# Patient Record
Sex: Male | Born: 1960
Health system: Southern US, Community
[De-identification: ages and names within clinical notes are randomized; demographics above are authoritative.]

## PROBLEM LIST (undated history)

## (undated) DIAGNOSIS — S5420XA Injury of radial nerve at forearm level, unspecified arm, initial encounter: Secondary | ICD-10-CM

## (undated) DIAGNOSIS — F528 Other sexual dysfunction not due to a substance or known physiological condition: Secondary | ICD-10-CM

## (undated) DIAGNOSIS — M722 Plantar fascial fibromatosis: Secondary | ICD-10-CM

## (undated) DIAGNOSIS — Z86010 Personal history of colonic polyps: Secondary | ICD-10-CM

## (undated) DIAGNOSIS — I10 Essential (primary) hypertension: Secondary | ICD-10-CM

## (undated) DIAGNOSIS — E785 Hyperlipidemia, unspecified: Secondary | ICD-10-CM

## (undated) DIAGNOSIS — E291 Testicular hypofunction: Secondary | ICD-10-CM

## (undated) DIAGNOSIS — K219 Gastro-esophageal reflux disease without esophagitis: Secondary | ICD-10-CM

## (undated) HISTORY — DX: Personal history of colonic polyps: Z86.010

## (undated) HISTORY — PX: TOTAL SHOULDER REPLACEMENT: SUR1217

## (undated) HISTORY — DX: Hyperlipidemia, unspecified: E78.5

## (undated) HISTORY — DX: Gastro-esophageal reflux disease without esophagitis: K21.9

## (undated) HISTORY — PX: COLONOSCOPY: SHX174

## (undated) HISTORY — DX: Other sexual dysfunction not due to a substance or known physiological condition: F52.8

## (undated) HISTORY — DX: Testicular hypofunction: E29.1

## (undated) HISTORY — DX: Injury of radial nerve at forearm level, unspecified arm, initial encounter: S54.20XA

## (undated) HISTORY — DX: Essential (primary) hypertension: I10

## (undated) HISTORY — DX: Plantar fascial fibromatosis: M72.2

---

## 2003-02-13 ENCOUNTER — Ambulatory Visit (HOSPITAL_COMMUNITY): Admission: RE | Admit: 2003-02-13 | Discharge: 2003-02-13 | Payer: Self-pay | Admitting: Gastroenterology

## 2003-02-13 ENCOUNTER — Encounter (INDEPENDENT_AMBULATORY_CARE_PROVIDER_SITE_OTHER): Payer: Self-pay | Admitting: *Deleted

## 2004-01-24 ENCOUNTER — Ambulatory Visit: Payer: Self-pay | Admitting: Internal Medicine

## 2004-04-11 ENCOUNTER — Ambulatory Visit: Payer: Self-pay | Admitting: Internal Medicine

## 2004-07-23 ENCOUNTER — Ambulatory Visit: Payer: Self-pay | Admitting: Internal Medicine

## 2004-08-24 ENCOUNTER — Ambulatory Visit: Payer: Self-pay | Admitting: Family Medicine

## 2004-10-22 ENCOUNTER — Ambulatory Visit: Payer: Self-pay | Admitting: Internal Medicine

## 2005-01-20 ENCOUNTER — Ambulatory Visit: Payer: Self-pay | Admitting: Internal Medicine

## 2005-03-24 ENCOUNTER — Ambulatory Visit: Payer: Self-pay | Admitting: Internal Medicine

## 2005-08-20 ENCOUNTER — Ambulatory Visit: Payer: Self-pay | Admitting: Internal Medicine

## 2005-09-22 ENCOUNTER — Ambulatory Visit: Payer: Self-pay | Admitting: Internal Medicine

## 2005-10-06 ENCOUNTER — Ambulatory Visit: Payer: Self-pay | Admitting: Internal Medicine

## 2005-12-29 ENCOUNTER — Ambulatory Visit: Payer: Self-pay | Admitting: Internal Medicine

## 2005-12-29 LAB — CONVERTED CEMR LAB
Chol/HDL Ratio, serum: 5.7
Cholesterol: 226 mg/dL
HDL: 39.6 mg/dL
LDL DIRECT: 162.6 mg/dL
Triglyceride fasting, serum: 128 mg/dL
VLDL: 26 mg/dL

## 2006-01-12 ENCOUNTER — Ambulatory Visit: Payer: Self-pay | Admitting: Internal Medicine

## 2006-04-06 ENCOUNTER — Ambulatory Visit: Payer: Self-pay | Admitting: Internal Medicine

## 2006-04-06 LAB — CONVERTED CEMR LAB
Alkaline Phosphatase: 67 units/L (ref 39–117)
HDL: 44.6 mg/dL (ref >39.0)
Total Bilirubin: 1 mg/dL (ref 0.3–1.2)
Total Protein: 6.8 g/dL (ref 6.0–8.3)
Triglycerides: 119 mg/dL (ref 0–149)

## 2006-04-13 ENCOUNTER — Ambulatory Visit: Payer: Self-pay | Admitting: Internal Medicine

## 2006-08-18 ENCOUNTER — Ambulatory Visit: Payer: Self-pay | Admitting: Internal Medicine

## 2006-08-19 LAB — CONVERTED CEMR LAB
Alkaline Phosphatase: 82 units/L (ref 39–117)
BUN: 15 mg/dL (ref 6–23)
Bilirubin, Direct: 0.1 mg/dL (ref 0.0–0.3)
CO2: 32 meq/L (ref 19–32)
GFR calc Af Amer: 94 mL/min
Glucose, Bld: 99 mg/dL (ref 70–99)
Potassium: 3.7 meq/L (ref 3.5–5.1)
Total Protein: 7.4 g/dL (ref 6.0–8.3)

## 2006-12-09 DIAGNOSIS — I152 Hypertension secondary to endocrine disorders: Secondary | ICD-10-CM | POA: Insufficient documentation

## 2006-12-09 DIAGNOSIS — E785 Hyperlipidemia, unspecified: Secondary | ICD-10-CM

## 2006-12-09 DIAGNOSIS — E1169 Type 2 diabetes mellitus with other specified complication: Secondary | ICD-10-CM | POA: Insufficient documentation

## 2006-12-09 DIAGNOSIS — K219 Gastro-esophageal reflux disease without esophagitis: Secondary | ICD-10-CM

## 2006-12-09 DIAGNOSIS — I10 Essential (primary) hypertension: Secondary | ICD-10-CM

## 2006-12-09 DIAGNOSIS — Z860100 Personal history of colon polyps, unspecified: Secondary | ICD-10-CM

## 2006-12-09 DIAGNOSIS — J309 Allergic rhinitis, unspecified: Secondary | ICD-10-CM | POA: Insufficient documentation

## 2006-12-09 DIAGNOSIS — E1159 Type 2 diabetes mellitus with other circulatory complications: Secondary | ICD-10-CM | POA: Insufficient documentation

## 2006-12-09 HISTORY — DX: Gastro-esophageal reflux disease without esophagitis: K21.9

## 2006-12-09 HISTORY — DX: Personal history of colonic polyps: Z86.010

## 2006-12-09 HISTORY — DX: Hyperlipidemia, unspecified: E78.5

## 2006-12-09 HISTORY — DX: Personal history of colon polyps, unspecified: Z86.0100

## 2006-12-09 HISTORY — DX: Essential (primary) hypertension: I10

## 2006-12-14 ENCOUNTER — Ambulatory Visit: Payer: Self-pay | Admitting: Internal Medicine

## 2007-03-08 ENCOUNTER — Telehealth (INDEPENDENT_AMBULATORY_CARE_PROVIDER_SITE_OTHER): Payer: Self-pay | Admitting: *Deleted

## 2007-04-12 ENCOUNTER — Ambulatory Visit: Payer: Self-pay | Admitting: Internal Medicine

## 2007-04-12 LAB — CONVERTED CEMR LAB
Cholesterol: 238 mg/dL (ref 0–200)
HDL: 43.9 mg/dL (ref >39.0)
LDL Cholesterol: 178.1 mg/dL
Triglycerides: 92 mg/dL (ref 0–149)

## 2007-04-19 ENCOUNTER — Ambulatory Visit: Payer: Self-pay | Admitting: Internal Medicine

## 2007-04-19 DIAGNOSIS — F341 Dysthymic disorder: Secondary | ICD-10-CM | POA: Insufficient documentation

## 2007-05-21 ENCOUNTER — Telehealth: Payer: Self-pay | Admitting: Internal Medicine

## 2007-05-31 ENCOUNTER — Telehealth: Payer: Self-pay | Admitting: Internal Medicine

## 2007-07-19 ENCOUNTER — Ambulatory Visit: Payer: Self-pay | Admitting: Internal Medicine

## 2007-07-19 DIAGNOSIS — T887XXA Unspecified adverse effect of drug or medicament, initial encounter: Secondary | ICD-10-CM | POA: Insufficient documentation

## 2007-07-19 LAB — CONVERTED CEMR LAB
Albumin: 3.8 g/dL (ref 3.5–5.2)
Cholesterol: 244 mg/dL (ref 0–200)
HDL: 37.2 mg/dL — ABNORMAL LOW (ref >39.0)
Total Protein: 6.8 g/dL (ref 6.0–8.3)
Triglycerides: 212 mg/dL (ref 0–149)
VLDL: 42 mg/dL — ABNORMAL HIGH (ref 0–40)

## 2007-08-02 ENCOUNTER — Ambulatory Visit: Payer: Self-pay | Admitting: Internal Medicine

## 2007-08-02 DIAGNOSIS — F528 Other sexual dysfunction not due to a substance or known physiological condition: Secondary | ICD-10-CM | POA: Insufficient documentation

## 2007-08-02 HISTORY — DX: Other sexual dysfunction not due to a substance or known physiological condition: F52.8

## 2007-08-02 LAB — CONVERTED CEMR LAB: LDL Goal: 130 mg/dL

## 2007-08-05 LAB — CONVERTED CEMR LAB: Testosterone: 241.97 ng/dL — ABNORMAL LOW (ref 350.00–890)

## 2007-11-01 ENCOUNTER — Ambulatory Visit: Payer: Self-pay | Admitting: Internal Medicine

## 2007-11-15 LAB — CONVERTED CEMR LAB
BUN: 15 mg/dL (ref 6–23)
Chloride: 104 meq/L (ref 96–112)
GFR calc non Af Amer: 77 mL/min
Glucose, Bld: 108 mg/dL — ABNORMAL HIGH (ref 70–99)
Potassium: 3.8 meq/L (ref 3.5–5.1)
Sex Hormone Binding: 14 nmol/L (ref 13–71)
Sodium: 143 meq/L (ref 135–145)
Testosterone Free: 69.7 pg/mL (ref 47.0–244.0)
Testosterone: 241.95 ng/dL — ABNORMAL LOW (ref 350–890)

## 2007-11-17 ENCOUNTER — Ambulatory Visit: Payer: Self-pay | Admitting: Internal Medicine

## 2007-12-22 ENCOUNTER — Telehealth: Payer: Self-pay | Admitting: *Deleted

## 2008-01-31 ENCOUNTER — Ambulatory Visit: Payer: Self-pay | Admitting: Internal Medicine

## 2008-03-28 ENCOUNTER — Encounter: Payer: Self-pay | Admitting: Internal Medicine

## 2008-05-10 ENCOUNTER — Ambulatory Visit: Payer: Self-pay | Admitting: Internal Medicine

## 2008-05-10 LAB — CONVERTED CEMR LAB
ALT: 35 units/L (ref 0–53)
Albumin: 3.9 g/dL (ref 3.5–5.2)
BUN: 18 mg/dL (ref 6–23)
Basophils Relative: 0.2 % (ref 0.0–3.0)
Bilirubin Urine: NEGATIVE
Bilirubin, Direct: 0.1 mg/dL (ref 0.0–0.3)
CO2: 32 meq/L (ref 19–32)
Calcium: 9.4 mg/dL (ref 8.4–10.5)
Cholesterol: 221 mg/dL (ref 0–200)
Creatinine, Ser: 1.2 mg/dL (ref 0.4–1.5)
Eosinophils Relative: 3.2 % (ref 0.0–5.0)
GFR calc Af Amer: 84 mL/min
Glucose, Bld: 104 mg/dL — ABNORMAL HIGH (ref 70–99)
Glucose, Urine, Semiquant: NEGATIVE
HCT: 40.9 % (ref 39.0–52.0)
Hemoglobin: 14.1 g/dL (ref 13.0–17.0)
Lymphocytes Relative: 34.5 % (ref 12.0–46.0)
Monocytes Absolute: 0.7 10*3/uL (ref 0.1–1.0)
Monocytes Relative: 10.4 % (ref 3.0–12.0)
Neutro Abs: 3.5 10*3/uL (ref 1.4–7.7)
PSA: 0.44 ng/mL (ref 0.10–4.00)
RBC: 4.92 M/uL (ref 4.22–5.81)
RDW: 12.2 % (ref 11.5–14.6)
TSH: 1.62 microintl units/mL (ref 0.35–5.50)
Total CHOL/HDL Ratio: 4.9
Total Protein: 7.1 g/dL (ref 6.0–8.3)
Urobilinogen, UA: 0.2
VLDL: 14 mg/dL (ref 0–40)
WBC Urine, dipstick: NEGATIVE
WBC: 6.7 10*3/uL (ref 4.5–10.5)

## 2008-05-29 ENCOUNTER — Ambulatory Visit: Payer: Self-pay | Admitting: Internal Medicine

## 2008-05-31 ENCOUNTER — Telehealth: Payer: Self-pay | Admitting: Internal Medicine

## 2008-08-23 ENCOUNTER — Ambulatory Visit: Payer: Self-pay | Admitting: Internal Medicine

## 2008-08-23 LAB — CONVERTED CEMR LAB
ALT: 38 units/L (ref 0–53)
Alkaline Phosphatase: 70 units/L (ref 39–117)
Bilirubin, Direct: 0 mg/dL (ref 0.0–0.3)
HDL: 46.3 mg/dL (ref >39.00)
Total Bilirubin: 0.9 mg/dL (ref 0.3–1.2)
Total Protein: 6.8 g/dL (ref 6.0–8.3)

## 2008-09-25 ENCOUNTER — Ambulatory Visit: Payer: Self-pay | Admitting: Internal Medicine

## 2008-10-18 ENCOUNTER — Telehealth (INDEPENDENT_AMBULATORY_CARE_PROVIDER_SITE_OTHER): Payer: Self-pay | Admitting: *Deleted

## 2008-12-18 ENCOUNTER — Ambulatory Visit: Payer: Self-pay | Admitting: Internal Medicine

## 2008-12-18 LAB — CONVERTED CEMR LAB
Albumin: 3.7 g/dL (ref 3.5–5.2)
Alkaline Phosphatase: 78 units/L (ref 39–117)
Cholesterol: 220 mg/dL — ABNORMAL HIGH (ref 0–200)
Direct LDL: 156.5 mg/dL
Total Bilirubin: 0.5 mg/dL (ref 0.3–1.2)
Total CHOL/HDL Ratio: 6
Triglycerides: 251 mg/dL — ABNORMAL HIGH (ref 0.0–149.0)
VLDL: 50.2 mg/dL — ABNORMAL HIGH (ref 0.0–40.0)

## 2008-12-25 ENCOUNTER — Ambulatory Visit: Payer: Self-pay | Admitting: Internal Medicine

## 2009-02-19 ENCOUNTER — Telehealth: Payer: Self-pay | Admitting: Internal Medicine

## 2009-04-30 ENCOUNTER — Ambulatory Visit: Payer: Self-pay | Admitting: Internal Medicine

## 2009-05-01 ENCOUNTER — Encounter (INDEPENDENT_AMBULATORY_CARE_PROVIDER_SITE_OTHER): Payer: Self-pay | Admitting: *Deleted

## 2009-05-25 ENCOUNTER — Encounter (INDEPENDENT_AMBULATORY_CARE_PROVIDER_SITE_OTHER): Payer: Self-pay | Admitting: *Deleted

## 2009-05-28 ENCOUNTER — Ambulatory Visit: Payer: Self-pay | Admitting: Gastroenterology

## 2009-06-01 ENCOUNTER — Telehealth: Payer: Self-pay | Admitting: Internal Medicine

## 2009-06-06 ENCOUNTER — Telehealth: Payer: Self-pay | Admitting: Internal Medicine

## 2009-07-23 ENCOUNTER — Encounter: Payer: Self-pay | Admitting: Internal Medicine

## 2009-08-20 ENCOUNTER — Ambulatory Visit: Payer: Self-pay | Admitting: Internal Medicine

## 2009-08-20 LAB — CONVERTED CEMR LAB
Albumin: 4 g/dL (ref 3.5–5.2)
Alkaline Phosphatase: 89 units/L (ref 39–117)
BUN: 13 mg/dL (ref 6–23)
Basophils Absolute: 0 10*3/uL (ref 0.0–0.1)
Bilirubin Urine: NEGATIVE
Blood in Urine, dipstick: NEGATIVE
CO2: 31 meq/L (ref 19–32)
Calcium: 8.9 mg/dL (ref 8.4–10.5)
Cholesterol: 223 mg/dL — ABNORMAL HIGH (ref 0–200)
Creatinine, Ser: 0.8 mg/dL (ref 0.4–1.5)
Eosinophils Absolute: 0.2 10*3/uL (ref 0.0–0.7)
GFR calc non Af Amer: 103.98 mL/min (ref >60)
Glucose, Bld: 99 mg/dL (ref 70–99)
HDL: 47.9 mg/dL (ref >39.00)
Hemoglobin: 14.6 g/dL (ref 13.0–17.0)
Ketones, urine, test strip: NEGATIVE
Lymphocytes Relative: 32.7 % (ref 12.0–46.0)
MCHC: 34.5 g/dL (ref 30.0–36.0)
Neutro Abs: 4.1 10*3/uL (ref 1.4–7.7)
Neutrophils Relative %: 55.9 % (ref 43.0–77.0)
Platelets: 210 10*3/uL (ref 150.0–400.0)
RDW: 13.2 % (ref 11.5–14.6)
Specific Gravity, Urine: 1.025
Total Bilirubin: 0.4 mg/dL (ref 0.3–1.2)
Triglycerides: 169 mg/dL — ABNORMAL HIGH (ref 0.0–149.0)

## 2009-08-27 ENCOUNTER — Ambulatory Visit: Payer: Self-pay | Admitting: Internal Medicine

## 2009-08-27 DIAGNOSIS — K621 Rectal polyp: Secondary | ICD-10-CM

## 2009-08-27 DIAGNOSIS — K620 Anal polyp: Secondary | ICD-10-CM | POA: Insufficient documentation

## 2009-09-05 ENCOUNTER — Encounter: Payer: Self-pay | Admitting: Internal Medicine

## 2009-11-02 ENCOUNTER — Telehealth: Payer: Self-pay | Admitting: Internal Medicine

## 2010-01-08 DIAGNOSIS — E291 Testicular hypofunction: Secondary | ICD-10-CM

## 2010-01-08 HISTORY — DX: Testicular hypofunction: E29.1

## 2010-01-10 ENCOUNTER — Encounter: Payer: Self-pay | Admitting: Internal Medicine

## 2010-01-21 ENCOUNTER — Ambulatory Visit: Payer: Self-pay | Admitting: Internal Medicine

## 2010-01-21 DIAGNOSIS — M722 Plantar fascial fibromatosis: Secondary | ICD-10-CM

## 2010-01-21 DIAGNOSIS — S5420XA Injury of radial nerve at forearm level, unspecified arm, initial encounter: Secondary | ICD-10-CM | POA: Insufficient documentation

## 2010-01-21 HISTORY — DX: Plantar fascial fibromatosis: M72.2

## 2010-01-21 HISTORY — DX: Injury of radial nerve at forearm level, unspecified arm, initial encounter: S54.20XA

## 2010-03-12 ENCOUNTER — Ambulatory Visit
Admission: RE | Admit: 2010-03-12 | Discharge: 2010-03-12 | Payer: Self-pay | Source: Home / Self Care | Attending: Internal Medicine | Admitting: Internal Medicine

## 2010-04-09 ENCOUNTER — Encounter: Payer: Self-pay | Admitting: *Deleted

## 2010-04-10 ENCOUNTER — Other Ambulatory Visit: Payer: Self-pay | Admitting: *Deleted

## 2010-04-11 ENCOUNTER — Telehealth: Payer: Self-pay | Admitting: Internal Medicine

## 2010-04-16 NOTE — Letter (Signed)
Summary: [facility] Instructions  [facility]  [address] [place], [place] [address]   Phone: [phone]  Fax: [phone]       SUVAN STCYR    [birth year]    MRN: [id number]        Procedure Day /Date: Monday 06/11/09     Arrival Time: 9:30 am     Procedure Time: 10:30 am     Location of Procedure:                    _x_  [facility] (4th Floor)                        PREPARATION FOR COLONOSCOPY WITH MOVIPREP   Starting 5 days prior to your procedure 06/06/09 do not eat nuts, seeds, popcorn, corn, beans, peas,  salads, or any raw vegetables.  Do not take any fiber supplements (e.g. Metamucil, Citrucel, and Benefiber).  THE DAY BEFORE YOUR PROCEDURE         DATE: 06/10/09  DAY: Sunday  1.  Drink clear liquids the entire day-NO SOLID FOOD  2.  Do not drink anything colored red or purple.  Avoid juices with pulp.  No orange juice.  3.  Drink at least 64 oz. (8 glasses) of fluid/clear liquids during the day to prevent dehydration and help the prep work efficiently.  CLEAR LIQUIDS INCLUDE: Water Jello Ice Popsicles Tea (sugar ok, no milk/cream) Powdered fruit flavored drinks Coffee (sugar ok, no milk/cream) Gatorade Juice: apple, white grape, white cranberry  Lemonade Clear bullion, consomm, broth Carbonated beverages (any kind) Strained chicken noodle soup Hard Candy                             4.  In the morning, mix first dose of MoviPrep solution:    Empty 1 Pouch A and 1 Pouch B into the disposable container    Add lukewarm drinking water to the top line of the container. Mix to dissolve    Refrigerate (mixed solution should be used within 24 hrs)  5.  Begin drinking the prep at 5:00 p.m. The MoviPrep container is divided by 4 marks.   Every 15 minutes drink the solution down to the next mark (approximately 8 oz) until the full liter is complete.   6.  Follow completed prep with 16 oz of clear liquid of your choice (Nothing  red or purple).  Continue to drink clear liquids until bedtime.  7.  Before going to bed, mix second dose of MoviPrep solution:    Empty 1 Pouch A and 1 Pouch B into the disposable container    Add lukewarm drinking water to the top line of the container. Mix to dissolve    Refrigerate  THE DAY OF YOUR PROCEDURE      DATE: 06/11/09  DAY: Monday  Beginning at 5:30 a.m. (5 hours before procedure):         1. Every 15 minutes, drink the solution down to the next mark (approx 8 oz) until the full liter is complete.  2. Follow completed prep with 16 oz. of clear liquid of your choice.    3. You may drink clear liquids until 8:30 a.m. (2 HOURS BEFORE PROCEDURE).   MEDICATION INSTRUCTIONS  Unless otherwise instructed, you should take regular prescription medications with a small sip of water   as early as possible the morning of  your procedure.  Hold your Moexipril/HCTZ the morning of procedure.       OTHER INSTRUCTIONS  You will need a responsible adult at least 48 years of age to accompany you and drive you home.   This person must remain in the waiting room during your procedure.  Wear loose fitting clothing that is easily removed.  Leave jewelry and other valuables at home.  However, you may wish to bring a book to read or  an iPod/MP3 player to listen to music as you wait for your procedure to start.  Remove all body piercing jewelry and leave at home.  Total time from sign-in until discharge is approximately 2-3 hours.  You should go home directly after your procedure and rest.  You can resume normal activities the  day after your procedure.  The day of your procedure you should not:   Drive   Make legal decisions   Operate machinery   Drink alcohol   Return to work  You will receive specific instructions about eating, activities and medications before you leave.    The above instructions have been reviewed and explained to me by   Wyona Almas RN   May 28, 2009 11:27 AM     I fully understand and can verbalize these instructions _____________________________ Date _________

## 2010-04-16 NOTE — Progress Notes (Signed)
Summary: Patient wants a call  Phone Note Call from Patient Call back at Work Phone [phone]   Caller: Patient Reason for Call: Talk to Nurse, Insurance Question Summary of Call: Patient wants to speak with you.  No other message left. Initial call taken by: Everrett Coombe,  June 06, 2009 11:38 AM  Follow-up for Phone Call        colonoscopy scheduled will cost- wants cheaper colonsocopy - sent referral to [facility] Follow-up by: Willy Eddy, LPN,  June 06, 2009 11:52 AM

## 2010-04-16 NOTE — Miscellaneous (Signed)
Summary: [facility] Previsit/prep  Clinical Lists Changes  Medications: Added new medication of MOVIPREP 100 GM  SOLR (PEG-KCL-NACL-NASULF-NA ASC-C) As per prep instructions. - Signed Rx of MOVIPREP 100 GM  SOLR (PEG-KCL-NACL-NASULF-NA ASC-C) As per prep instructions.;  #1 x 0;  Signed;  Entered by: Wyona Almas RN;  Authorized by: Mardella Layman MD [facility];  Method used: Electronically to [facility] # [phone]*, [address] [place], [place], [place]  [address], Ph: [phone], Fax: [phone] Observations: Added new observation of NKA: T (05/28/2009 11:04)    Prescriptions: MOVIPREP 100 GM  SOLR (PEG-KCL-NACL-NASULF-NA ASC-C) As per prep instructions.  #1 x 0   Entered by:   Wyona Almas RN   Authorized by:   Mardella Layman MD [facility]   Signed by:   Wyona Almas RN on 05/28/2009   Method used:   [facility]ron[facility] to        [facility] # [phone]* (retail)       [address] [place], [place]  [address]       Ph: [phone]       Fax: [phone]   RxID:   [id number]

## 2010-04-16 NOTE — Assessment & Plan Note (Signed)
Summary: ROA/FUP/RCD   Vital Signs:  Patient profile:   50 year old male Height:      74 inches Weight:      242 pounds BMI:     31.18 Temp:     98.1 degrees F oral Pulse rate:   68 / minute Resp:     14 per minute BP sitting:   140 / 80  (left arm)  Vitals Entered By: Willy Eddy, LPN (April 30, 2009 10:12 AM)  Nutrition Counseling: Patient's BMI is greater than 25 and therefore counseled on weight management options. CC: roa, Hypertension Management   CC:  roa and Hypertension Management.  Hypertension History:      He denies headache, chest pain, palpitations, dyspnea with exertion, orthopnea, PND, peripheral edema, visual symptoms, neurologic problems, syncope, and side effects from treatment.        Positive major cardiovascular risk factors include male age 24 years old or older, hyperlipidemia, and hypertension.  Negative major cardiovascular risk factors include non-tobacco-user status.     Preventive Screening-Counseling & Management  Alcohol-Tobacco     Smoking Status: never  Problems Prior to Update: 1)  Headache, Sinus  (ICD-784.0) 2)  Acute Sinusitis, Unspecified  (ICD-461.9) 3)  Preventive Health Care  (ICD-V70.0) 4)  Erectile Dysfunction, Mild  (ICD-302.72) 5)  Uns Advrs Eff Uns Rx Medicinal&biological Sbstnc  (ICD-995.20) 6)  Anxiety Depression  (ICD-300.4) 7)  Family History Diabetes 1st Degree Relative  (ICD-V18.0) 8)  Family History Breast Cancer 1st Degree Relative <50  (ICD-V16.3) 9)  Colonic Polyps, Hx of  (ICD-V12.72) 10)  Gerd  (ICD-530.81) 11)  Allergic Rhinitis  (ICD-477.9) 12)  Hypertension  (ICD-401.9) 13)  Hyperlipidemia  (ICD-272.4)  Current Problems (verified): 1)  Headache, Sinus  (ICD-784.0) 2)  Acute Sinusitis, Unspecified  (ICD-461.9) 3)  Preventive Health Care  (ICD-V70.0) 4)  Erectile Dysfunction, Mild  (ICD-302.72) 5)  Uns Advrs Eff Uns Rx Medicinal&biological Sbstnc  (ICD-995.20) 6)  Anxiety Depression   (ICD-300.4) 7)  Family History Diabetes 1st Degree Relative  (ICD-V18.0) 8)  Family History Breast Cancer 1st Degree Relative <50  (ICD-V16.3) 9)  Colonic Polyps, Hx of  (ICD-V12.72) 10)  Gerd  (ICD-530.81) 11)  Allergic Rhinitis  (ICD-477.9) 12)  Hypertension  (ICD-401.9) 13)  Hyperlipidemia  (ICD-272.4)  Medications Prior to Update: 1)  Nexium 40 Mg  Cpdr (Esomeprazole Magnesium) .... Once Daily 2)  Adipex-P 37.5 Mg  Caps (Phentermine Hcl) .... Out!!!! 3)  Uniretic 15-25 Mg  Tabs (Moexipril-Hydrochlorothiazide) .... Once Daily 4)  Multivitamin/iron 60 Mg  Chew (Pediatric Multivit-Minerals-C) .... Once Daily 5)  Depo-Testosterone 200 Mg/ml Oil (Testosterone Cypionate) .Marland Kitchen.. 1 Ml  Monthly 6)  Bd Disp Needles 20g X 1" Misc (Needle (Disp)) .... With 3 Cc Syringes- # 10 Called In 7)  Fexofenadine-Pseudoephedrine 60-120 Mg Xr12h-Tab (Fexofenadine-Pseudoephedrine) .... One By Mouth Bid 8)  Elidel 1 % Crea (Pimecrolimus) .... Use As Directed 9)  Vytorin 10-40 Mg Tabs (Ezetimibe-Simvastatin) .... One By Mouth Two Times A Week 10)  Nasonex 50 Mcg/act Susp (Mometasone Furoate) .... Two Spray in Each Nostril  Daily 11)  Zithromax Z-Pak 250 Mg Tabs (Azithromycin) .... One By Mouth Daily 12)  Mucinex 600 Mg Xr12h-Tab (Guaifenesin) .... As Directed 13)  Zyrtec Allergy 10 Mg Caps (Cetirizine Hcl) .... One By Mouth Daily  Current Medications (verified): 1)  Nexium 40 Mg  Cpdr (Esomeprazole Magnesium) .... Once Daily 2)  Adipex-P 37.5 Mg  Caps (Phentermine Hcl) .... Out!!!! 3)  Uniretic 15-25 Mg  Tabs (Moexipril-Hydrochlorothiazide) .... Once Daily 4)  Multivitamin/iron 60 Mg  Chew (Pediatric Multivit-Minerals-C) .... Once Daily 5)  Depo-Testosterone 200 Mg/ml Oil (Testosterone Cypionate) .Marland Kitchen.. 1 Ml  Monthly 6)  Bd Disp Needles 20g X 1" Misc (Needle (Disp)) .... With 3 Cc Syringes- # 10 Called In 7)  Fexofenadine-Pseudoephedrine 60-120 Mg Xr12h-Tab (Fexofenadine-Pseudoephedrine) .... One By Mouth  Bid 8)  Elidel 1 % Crea (Pimecrolimus) .... Use As Directed 9)  Vytorin 10-40 Mg Tabs (Ezetimibe-Simvastatin) .... One By Mouth Two Times A Week 10)  Nasonex 50 Mcg/act Susp (Mometasone Furoate) .... Two Spray in Each Nostril  Daily  Allergies (verified): No Known Drug Allergies  Past History:  Family History: Last updated: 12/14/2006 Breast CA Family History Breast cancer 1st degree relative <50 Family History Diabetes 1st degree relative Family History Hypertension  Social History: Last updated: 12/09/2006 Occupation: Single Never Smoked  Risk Factors: Smoking Status: never (04/30/2009)  Past medical, surgical, family and social histories (including risk factors) reviewed, and no changes noted (except as noted below).  Past Medical History: Reviewed history from 12/09/2006 and no changes required. Hyperlipidemia Hypertension Allergic rhinitis GERD Colonic polyps, hx of  Past Surgical History: Reviewed history from 12/09/2006 and no changes required. Colonoscopy  Family History: Reviewed history from 12/14/2006 and no changes required. Breast CA Family History Breast cancer 1st degree relative <50 Family History Diabetes 1st degree relative Family History Hypertension  Social History: Reviewed history from 12/09/2006 and no changes required. Occupation: Single Never Smoked  Review of Systems  The patient denies anorexia, fever, weight loss, weight gain, vision loss, decreased hearing, hoarseness, chest pain, syncope, dyspnea on exertion, peripheral edema, prolonged cough, headaches, hemoptysis, abdominal pain, melena, hematochezia, severe indigestion/heartburn, hematuria, incontinence, genital sores, muscle weakness, suspicious skin lesions, transient blindness, difficulty walking, depression, unusual weight change, abnormal bleeding, enlarged lymph nodes, angioedema, and breast masses.    Physical Exam  General:  Well-developed,well-nourished,in no acute  distress; alert,appropriate and cooperative throughout examination Eyes:  pupils equal and pupils round.   Nose:  mucosal erythema, mucosal edema, and airflow obstruction.   Mouth:  posterior lymphoid hypertrophy and postnasal drip.   Neck:  No deformities, masses, or tenderness noted. Lungs:  Normal respiratory effort, chest expands symmetrically. Lungs are clear to auscultation, no crackles or wheezes. Heart:  Normal rate and regular rhythm. S1 and S2 normal without gallop, murmur, click, rub or other extra sounds. Abdomen:  Bowel sounds positive,abdomen soft and non-tender without masses, organomegaly or hernias noted.   Impression & Recommendations:  Problem # 1:  HYPERTENSION (ICD-401.9)  His updated medication list for this problem includes:    Uniretic 15-25 Mg Tabs (Moexipril-hydrochlorothiazide) ..... Once daily  BP today: 140/80 Prior BP: 140/72 (12/25/2008)  Prior 10 Yr Risk Heart Disease: Not enough information (12/25/2008)  Labs Reviewed: K+: 4.3 (05/10/2008) Creat: : 1.2 (05/10/2008)   Chol: 220 (12/18/2008)   HDL: 39.70 (12/18/2008)   LDL: DEL (05/10/2008)   TG: 251.0 (12/18/2008)  Problem # 2:  GERD (ICD-530.81) heart burn is satble but having increased episodes of break through pain took tums and it helped some, cannot relate to exercize  His updated medication list for this problem includes:    Nexium 40 Mg Cpdr (Esomeprazole magnesium) ..... Once daily  Labs Reviewed: Hgb: 14.1 (05/10/2008)   Hct: 40.9 (05/10/2008)  Problem # 3:  ANXIETY DEPRESSION (ICD-300.4) monitering mood and medication need  Problem # 4:  ERECTILE DYSFUNCTION, MILD (ICD-302.72) on testosterone Orders: Admin of patients own med IM/SQ (95621H)  Discussed proper use of medications, as well as side effects.   Complete Medication List: 1)  Nexium 40 Mg Cpdr (Esomeprazole magnesium) .... Once daily 2)  Adipex-p 37.5 Mg Caps (Phentermine hcl) .... Out!!!! 3)  Uniretic 15-25 Mg Tabs  (Moexipril-hydrochlorothiazide) .... Once daily 4)  Multivitamin/iron 60 Mg Chew (Pediatric multivit-minerals-c) .... Once daily 5)  Depo-testosterone 200 Mg/ml Oil (Testosterone cypionate) .Marland Kitchen.. 1 ml  monthly 6)  Bd Disp Needles 20g X 1" Misc (Needle (disp)) .... With 3 cc syringes- # 10 called in 7)  Fexofenadine-pseudoephedrine 60-120 Mg Xr12h-tab (Fexofenadine-pseudoephedrine) .... One by mouth bid 8)  Elidel 1 % Crea (Pimecrolimus) .... Use as directed 9)  Vytorin 10-40 Mg Tabs (Ezetimibe-simvastatin) .... One by mouth two times a week 10)  Nasonex 50 Mcg/act Susp (Mometasone furoate) .... Two spray in each nostril  daily  Other Orders: Gastroenterology Referral (GI)  Hypertension Assessment/Plan:      The patient's hypertensive risk group is category B: At least one risk factor (excluding diabetes) with no target organ damage.  Today's blood pressure is 140/80.  His blood pressure goal is < 140/90.  Patient Instructions: 1)  June CPX/ welness   Medication Administration  Injection # 1:    Medication: Testosterone Cypionat 200mg  ing    Diagnosis: ERECTILE DYSFUNCTION, MILD (ICD-302.72)    Route: IM    Site: LUOQ gluteus    Exp Date: 12/14/2009    Lot #: [id number]    Mfr: sandoz    Patient tolerated injection without complications    Given by: Willy Eddy, LPN (April 30, 2009 10:23 AM)  Orders Added: 1)  Admin of patients own med IM/SQ [96372M] 2)  Est. Patient Level V [40102] 3)  Gastroenterology Referral [GI]    Preventive Care Screening  Colonoscopy:    Next Due:  01/2008

## 2010-04-16 NOTE — Progress Notes (Signed)
Summary: abdominal pain  Phone Note Call from Patient   Caller: Patient Call For: Stacie Glaze MD Reason for Call: Acute Illness Summary of Call: Started Tues with bilateral low back pain, then radiates to LUQ pain.  No nausea, vomiting or diarrhea.  No fever, No injury but is worse with movement.  Better at rest, and is sore to palpation.  Feels more like "gas pain", but no gas issues.  No urinary complaints. [phone] Initial call taken by: Lynann Beaver CMA,  November 02, 2009 10:56 AM  Follow-up for Phone Call        per dr Lovell Sheehan- could be some diverticulitis- try flagyl 250 four times a day for 7 days and cipro 500 two times a day for 7 days  Follow-up by: Willy Eddy, LPN,  November 02, 2009 11:53 AM    New/Updated Medications: CIPRO 500 MG TABS (CIPROFLOXACIN HCL) one by mouth two times a day x 7 days METRONIDAZOLE 250 MG TABS (METRONIDAZOLE) one by mouth 1 qid x 7 days Prescriptions: METRONIDAZOLE 250 MG TABS (METRONIDAZOLE) one by mouth 1 qid x 7 days  #28 x 0   Entered by:   Lynann Beaver CMA   Authorized by:   Stacie Glaze MD   Signed by:   Lynann Beaver CMA on 11/02/2009   Method used:   [facility]        [facility] # [phone]* (retail)       [address] [place], [place]  [address]       Ph: [phone]       Fax: [phone]   RxID:   [id number] CIPRO 500 MG TABS (CIPROFLOXACIN HCL) one by mouth two times a day x 7 days  #14 x 0   Entered by:   Lynann Beaver CMA   Authorized by:   Stacie Glaze MD   Signed by:   Lynann Beaver CMA on 11/02/2009   Method used:   [facility] to        [facility] # [id number]* (retail)       [address] [place], [place]  [address]       Ph: [phone]       Fax: [phone]   RxID:   [id number]  Pt notified.

## 2010-04-16 NOTE — Assessment & Plan Note (Signed)
Summary: cpx/njr   Vital Signs:  Patient profile:   50 year old male Height:      74 inches Weight:      243 pounds BMI:     31.31 Temp:     98.2 degrees F oral Pulse rate:   68 / minute Resp:     14 per minute BP sitting:   136 / 80  (left arm)  Vitals Entered By: Willy Eddy, LPN (August 27, 2009 11:14 AM) CC: cpx   Primary Care Provider:  Stacie Glaze MD  CC:  cpx.  History of Present Illness: The pt was asked about all immunizations, health maint. services that are appropriate to their age and was given guidance on diet exercize  and weight management   Hyperlipidemia Follow-Up      This is a 50 year old man who presents for Hyperlipidemia follow-up.  review the lipids.  The patient reports muscle aches.  The patient denies the following symptoms: chest pain/pressure, exercise intolerance, dypsnea, palpitations, syncope, and pedal edema.  Compliance with medications (by patient report) has been near 100%.  Dietary compliance has been fair.  The patient reports exercising occasionally.  Adjunctive measures currently used by the patient include weight reduction.    Preventive Screening-Counseling & Management  Alcohol-Tobacco     Smoking Status: never  Problems Prior to Update: 1)  Headache, Sinus  (ICD-784.0) 2)  Acute Sinusitis, Unspecified  (ICD-461.9) 3)  Preventive Health Care  (ICD-V70.0) 4)  Erectile Dysfunction, Mild  (ICD-302.72) 5)  Uns Advrs Eff Uns Rx Medicinal&biological Sbstnc  (ICD-995.20) 6)  Anxiety Depression  (ICD-300.4) 7)  Family History Diabetes 1st Degree Relative  (ICD-V18.0) 8)  Family History Breast Cancer 1st Degree Relative <50  (ICD-V16.3) 9)  Colonic Polyps, Hx of  (ICD-V12.72) 10)  Gerd  (ICD-530.81) 11)  Allergic Rhinitis  (ICD-477.9) 12)  Hypertension  (ICD-401.9) 13)  Hyperlipidemia  (ICD-272.4)  Current Problems (verified): 1)  Headache, Sinus  (ICD-784.0) 2)  Acute Sinusitis, Unspecified  (ICD-461.9) 3)  Preventive Health  Care  (ICD-V70.0) 4)  Erectile Dysfunction, Mild  (ICD-302.72) 5)  Uns Advrs Eff Uns Rx Medicinal&biological Sbstnc  (ICD-995.20) 6)  Anxiety Depression  (ICD-300.4) 7)  Family History Diabetes 1st Degree Relative  (ICD-V18.0) 8)  Family History Breast Cancer 1st Degree Relative <50  (ICD-V16.3) 9)  Colonic Polyps, Hx of  (ICD-V12.72) 10)  Gerd  (ICD-530.81) 11)  Allergic Rhinitis  (ICD-477.9) 12)  Hypertension  (ICD-401.9) 13)  Hyperlipidemia  (ICD-272.4)  Medications Prior to Update: 1)  Nexium 40 Mg  Cpdr (Esomeprazole Magnesium) .... Once Daily 2)  Adipex-P 37.5 Mg  Caps (Phentermine Hcl) .... Out!!!! 3)  Uniretic 15-25 Mg  Tabs (Moexipril-Hydrochlorothiazide) .... Once Daily 4)  Multivitamin/iron 60 Mg  Chew (Pediatric Multivit-Minerals-C) .... Once Daily 5)  Depo-Testosterone 200 Mg/ml Oil (Testosterone Cypionate) .Marland Kitchen.. 1 Ml  Monthly 6)  Bd Disp Needles 20g X 1" Misc (Needle (Disp)) .... With 3 Cc Syringes- # 10 Called In 7)  Fexofenadine-Pseudoephedrine 60-120 Mg Xr12h-Tab (Fexofenadine-Pseudoephedrine) .... One By Mouth Bid 8)  Elidel 1 % Crea (Pimecrolimus) .... Use As Directed 9)  Vytorin 10-40 Mg Tabs (Ezetimibe-Simvastatin) .... One By Mouth Two Times A Week 10)  Nasonex 50 Mcg/act Susp (Mometasone Furoate) .... Two Spray in Each Nostril  Daily 11)  Zithromax Z-Pak 250 Mg Tabs (Azithromycin) .... Take As Directed  Current Medications (verified): 1)  Nexium 40 Mg  Cpdr (Esomeprazole Magnesium) .... Once Daily 2)  Uniretic 15-25  Mg  Tabs (Moexipril-Hydrochlorothiazide) .... Once Daily 3)  Multivitamin/iron 60 Mg  Chew (Pediatric Multivit-Minerals-C) .... Once Daily 4)  Depo-Testosterone 200 Mg/ml Oil (Testosterone Cypionate) .Marland Kitchen.. 1 Ml  Monthly 5)  Bd Disp Needles 20g X 1" Misc (Needle (Disp)) .... With 3 Cc Syringes- # 10 Called In 6)  Fexofenadine-Pseudoephedrine 60-120 Mg Xr12h-Tab (Fexofenadine-Pseudoephedrine) .... One By Mouth Bid 7)  Elidel 1 % Crea (Pimecrolimus) ....  Use As Directed 8)  Vytorin 10-40 Mg Tabs (Ezetimibe-Simvastatin) .... One By Mouth  Monady Wednesday and Friday 9)  Nasonex 50 Mcg/act Susp (Mometasone Furoate) .... Two Spray in Each Nostril  Daily  Allergies (verified): No Known Drug Allergies  Past History:  Family History: Last updated: 12/14/2006 Breast CA Family History Breast cancer 1st degree relative <50 Family History Diabetes 1st degree relative Family History Hypertension  Social History: Last updated: 12/09/2006 Occupation: Single Never Smoked  Risk Factors: Smoking Status: never (08/27/2009)  Past medical, surgical, family and social histories (including risk factors) reviewed, and no changes noted (except as noted below).  Past Medical History: Reviewed history from 12/09/2006 and no changes required. Hyperlipidemia Hypertension Allergic rhinitis GERD Colonic polyps, hx of  Past Surgical History: Reviewed history from 12/09/2006 and no changes required. Colonoscopy  Family History: Reviewed history from 12/14/2006 and no changes required. Breast CA Family History Breast cancer 1st degree relative <50 Family History Diabetes 1st degree relative Family History Hypertension  Social History: Reviewed history from 12/09/2006 and no changes required. Occupation: Single Never Smoked  Review of Systems  The patient denies anorexia, fever, weight loss, weight gain, vision loss, decreased hearing, hoarseness, chest pain, syncope, dyspnea on exertion, peripheral edema, prolonged cough, headaches, hemoptysis, abdominal pain, melena, hematochezia, severe indigestion/heartburn, hematuria, incontinence, genital sores, muscle weakness, suspicious skin lesions, transient blindness, difficulty walking, depression, unusual weight change, abnormal bleeding, enlarged lymph nodes, angioedema, and breast masses.    Physical Exam  General:  Well-developed,well-nourished,in no acute distress; alert,appropriate and  cooperative throughout examination Eyes:  pupils equal and pupils round.   Nose:  mucosal erythema, mucosal edema, and airflow obstruction.   Mouth:  posterior lymphoid hypertrophy and postnasal drip.   Neck:  No deformities, masses, or tenderness noted. Lungs:  Normal respiratory effort, chest expands symmetrically. Lungs are clear to auscultation, no crackles or wheezes. Heart:  Normal rate and regular rhythm. S1 and S2 normal without gallop, murmur, click, rub or other extra sounds. Abdomen:  Bowel sounds positive,abdomen soft and non-tender without masses, organomegaly or hernias noted. Rectal:  no external abnormalities and external hemorrhoid(s).   Prostate:  no gland enlargement and no nodules.   Msk:  No deformity or scoliosis noted of thoracic or lumbar spine.   Pulses:  R and L carotid,radial,femoral,dorsalis pedis and posterior tibial pulses are full and equal bilaterally Extremities:  No clubbing, cyanosis, edema, or deformity noted with normal full range of motion of all joints.   Neurologic:  alert & oriented X3, cranial nerves II-XII intact, and strength normal in all extremities.     Impression & Recommendations:  Problem # 1:  PREVENTIVE HEALTH CARE (ICD-V70.0)  Colonoscopy: Adenomatous Polyp (01/20/2003) Td Booster: Historical (09/14/2005)   Flu Vax: Fluvax 3+ (12/25/2008)   Chol: 223 (08/20/2009)   HDL: 47.90 (08/20/2009)   LDL: DEL (05/10/2008)   TG: 169.0 (08/20/2009) TSH: 2.11 (08/20/2009)   PSA: 0.44 (05/10/2008) Next Colonoscopy due:: 01/2008 (04/30/2009)  Discussed using sunscreen, use of alcohol, drug use, self testicular exam, routine dental care, routine eye care, routine physical  exam, seat belts, multiple vitamins, osteoporosis prevention, adequate calcium intake in diet, and recommendations for immunizations.  Discussed exercise and checking cholesterol.  Discussed gun safety, safe sex, and contraception. Also recommend checking PSA.  Problem # 2:   HYPERLIPIDEMIA (ICD-272.4) one by mouth MWF His updated medication list for this problem includes:    Vytorin 10-40 Mg Tabs (Ezetimibe-simvastatin) ..... One by mouth  monady wednesday and friday  Labs Reviewed: SGOT: 30 (08/20/2009)   SGPT: 36 (08/20/2009)  Lipid Goals: Chol Goal: 200 (04/19/2007)   HDL Goal: 40 (04/19/2007)   LDL Goal: 130 (08/02/2007)   TG Goal: 150 (04/19/2007)  Prior 10 Yr Risk Heart Disease: Not enough information (12/25/2008)   HDL:47.90 (08/20/2009), 39.70 (12/18/2008)  LDL:DEL (05/10/2008), DEL (11/01/2007)  Chol:223 (08/20/2009), 220 (12/18/2008)  Trig:169.0 (08/20/2009), 251.0 (12/18/2008)  Problem # 3:  ANAL AND RECTAL POLYP (ICD-569.0) colon at [facility] and had banding of hemorrhoids as well  Problem # 4:  GERD (ICD-530.81)  His updated medication list for this problem includes:    Nexium 40 Mg Cpdr (Esomeprazole magnesium) ..... Once daily  Labs Reviewed: Hgb: 14.6 (08/20/2009)   Hct: 42.2 (08/20/2009)  Complete Medication List: 1)  Nexium 40 Mg Cpdr (Esomeprazole magnesium) .... Once daily 2)  Uniretic 15-25 Mg Tabs (Moexipril-hydrochlorothiazide) .... Once daily 3)  Multivitamin/iron 60 Mg Chew (Pediatric multivit-minerals-c) .... Once daily 4)  Depo-testosterone 200 Mg/ml Oil (Testosterone cypionate) .Marland Kitchen.. 1 ml  monthly 5)  Bd Disp Needles 20g X 1" Misc (Needle (disp)) .... With 3 cc syringes- # 10 called in 6)  Fexofenadine-pseudoephedrine 60-120 Mg Xr12h-tab (Fexofenadine-pseudoephedrine) .... One by mouth bid 7)  Elidel 1 % Crea (Pimecrolimus) .... Use as directed 8)  Vytorin 10-40 Mg Tabs (Ezetimibe-simvastatin) .... One by mouth  monady wednesday and friday 9)  Nasonex 50 Mcg/act Susp (Mometasone furoate) .... Two spray in each nostril  daily  Other Orders: Admin of patients own med IM/SQ (16109U)  Diabetes Management Assessment/Plan:      The following lipid goals have been established for the patient: Total cholesterol goal of 200;  LDL cholesterol goal of 130; HDL cholesterol goal of 40; Triglyceride goal of 150.  His blood pressure goal is < 140/90.    Patient Instructions: 1)  Please schedule a follow-up appointment in 4 months.         Medication Administration  Injection # 1:    Medication: Testosterone Cypionat 200mg  ing    Diagnosis: ERECTILE DYSFUNCTION, MILD (ICD-302.72)    Route: IM    Site: RUOQ gluteus    Exp Date: 12/15/2010    Lot #: [id number]    Mfr: sandoz    Patient tolerated injection without complications    Given by: Willy Eddy, LPN (August 27, 2009 11:25 AM)  Orders Added: 1)  Admin of patients own med IM/SQ [96372M] 2)  Est. Patient 40-64 years [99396] 3)  Est. Patient Level III [id number]

## 2010-04-16 NOTE — Letter (Signed)
Summary: [facility]   Imported By: Maryln Gottron 08/30/2009 12:31:17  _____________________________________________________________________  External Attachment:    Type:   Image     Comment:   External Document

## 2010-04-16 NOTE — Letter (Signed)
Summary: Previsit letter  [facility]  [address] [place], [place] [address]   Phone: [phone]  Fax: [phone]       05/01/2009 MRN: [id number]  [facility] [address] [place], [place]  [address]  Dear Jason Lopez,  Welcome to the Gastroenterology Division at [facility].    You are scheduled to see a nurse for your pre-procedure visit on 05-28-09 at 11:00a.m. on the 3rd floor at [facility], [address].  We ask that you try to arrive at our office 15 minutes prior to your appointment time to allow for check-in.  Your nurse visit will consist of discussing your medical and surgical history, your immediate family medical history, and your medications.    Please bring a complete list of all your medications or, if you prefer, bring the medication bottles and we will list them.  We will need to be aware of both prescribed and over the counter drugs.  We will need to know exact dosage information as well.  If you are on blood thinners (Coumadin, Plavix, Aggrenox, Ticlid, etc.) please call our office today/prior to your appointment, as we need to consult with your physician about holding your medication.   Please be prepared to read and sign documents such as consent forms, a financial agreement, and acknowledgement forms.  If necessary, and with your consent, a friend or relative is welcome to sit-in on the nurse visit with you.  Please bring your insurance card so that we may make a copy of it.  If your insurance requires a referral to see a specialist, please bring your referral form from your primary care physician.  No co-pay is required for this nurse visit.     If you cannot keep your appointment, please call [phone] to cancel or reschedule prior to your appointment date.  This allows [place] the opportunity to schedule an appointment for another patient in need of care.    Thank you for choosing [facility] for your medical  needs.  We appreciate the opportunity to care for you.  Please visit [place] at our website  to learn more about our practice.                     Sincerely.                                                                                                                   The Gastroenterology Division

## 2010-04-16 NOTE — Consult Note (Signed)
Summary: [facility]  [facility]   Imported By: Maryln Gottron 09/21/2009 12:55:41  _____________________________________________________________________  External Attachment:    Type:   Image     Comment:   External Document

## 2010-04-16 NOTE — Medication Information (Signed)
Summary: PA & Approval for Androderm  PA & Approval for Androderm   Imported By: Maryln Gottron 01/14/2010 09:54:51  _____________________________________________________________________  External Attachment:    Type:   Image     Comment:   External Document

## 2010-04-16 NOTE — Procedures (Signed)
Summary: Diagnostic Colonoscopy/[facility]  Diagnostic Colonoscopy/[facility]   Imported By: Maryln Gottron 08/30/2009 12:34:12  _____________________________________________________________________  External Attachment:    Type:   Image     Comment:   External Document

## 2010-04-16 NOTE — Assessment & Plan Note (Signed)
Summary: 4 month rov/njr rsc per pt/njr   Vital Signs:  Patient profile:   50 year old male Height:      74 inches Weight:      249 pounds BMI:     32.09 Temp:     98.2 degrees F oral Pulse rate:   68 / minute Resp:     14 per minute BP sitting:   140 / 80  (left arm)  Vitals Entered By: Willy Eddy, LPN (January 21, 2010 11:16 AM)  Nutrition Counseling: Patient's BMI is greater than 25 and therefore counseled on weight management options. CC: roa Is Patient Diabetic? No   Primary Care Provider:  Stacie Glaze MD  CC:  roa.  History of Present Illness: the pt was treated  for  diverticulitis in August with good response. the pt has occasional left upper quadrant need to the epigastrim the pt aslo was noted to have gastritis and GERD vytorin three times a week and he notes increased joint pain has increased plantar faciatis and radial tunnel... seeing orthopedist Taking Diclofenac ( see gastritis!)   Preventive Screening-Counseling & Management  Alcohol-Tobacco     Smoking Status: never     Tobacco Counseling: not indicated; no tobacco use  Problems Prior to Update: 1)  Hypogonadism  (ICD-257.2) 2)  Anal and Rectal Polyp  (ICD-569.0) 3)  Headache, Sinus  (ICD-784.0) 4)  Acute Sinusitis, Unspecified  (ICD-461.9) 5)  Preventive Health Care  (ICD-V70.0) 6)  Erectile Dysfunction, Mild  (ICD-302.72) 7)  Uns Advrs Eff Uns Rx Medicinal&biological Sbstnc  (ICD-995.20) 8)  Anxiety Depression  (ICD-300.4) 9)  Family History Diabetes 1st Degree Relative  (ICD-V18.0) 10)  Family History Breast Cancer 1st Degree Relative <50  (ICD-V16.3) 11)  Colonic Polyps, Hx of  (ICD-V12.72) 12)  Gerd  (ICD-530.81) 13)  Allergic Rhinitis  (ICD-477.9) 14)  Hypertension  (ICD-401.9) 15)  Hyperlipidemia  (ICD-272.4)  Current Problems (verified): 1)  Hypogonadism  (ICD-257.2) 2)  Anal and Rectal Polyp  (ICD-569.0) 3)  Headache, Sinus  (ICD-784.0) 4)  Acute Sinusitis, Unspecified   (ICD-461.9) 5)  Preventive Health Care  (ICD-V70.0) 6)  Erectile Dysfunction, Mild  (ICD-302.72) 7)  Uns Advrs Eff Uns Rx Medicinal&biological Sbstnc  (ICD-995.20) 8)  Anxiety Depression  (ICD-300.4) 9)  Family History Diabetes 1st Degree Relative  (ICD-V18.0) 10)  Family History Breast Cancer 1st Degree Relative <50  (ICD-V16.3) 11)  Colonic Polyps, Hx of  (ICD-V12.72) 12)  Gerd  (ICD-530.81) 13)  Allergic Rhinitis  (ICD-477.9) 14)  Hypertension  (ICD-401.9) 15)  Hyperlipidemia  (ICD-272.4)  Medications Prior to Update: 1)  Nexium 40 Mg  Cpdr (Esomeprazole Magnesium) .... Once Daily 2)  Uniretic 15-25 Mg  Tabs (Moexipril-Hydrochlorothiazide) .... Once Daily 3)  Multivitamin/iron 60 Mg  Chew (Pediatric Multivit-Minerals-C) .... Once Daily 4)  Depo-Testosterone 200 Mg/ml Oil (Testosterone Cypionate) .Marland Kitchen.. 1 Ml  Monthly 5)  Bd Disp Needles 20g X 1" Misc (Needle (Disp)) .... With 3 Cc Syringes- # 10 Called In 6)  Fexofenadine-Pseudoephedrine 60-120 Mg Xr12h-Tab (Fexofenadine-Pseudoephedrine) .... One By Mouth Bid 7)  Elidel 1 % Crea (Pimecrolimus) .... Use As Directed 8)  Vytorin 10-40 Mg Tabs (Ezetimibe-Simvastatin) .... One By Mouth  Monady Wednesday and Friday 9)  Nasonex 50 Mcg/act Susp (Mometasone Furoate) .... Two Spray in Each Nostril  Daily 10)  Cipro 500 Mg Tabs (Ciprofloxacin Hcl) .... One By Mouth Two Times A Day X 7 Days 11)  Metronidazole 250 Mg Tabs (Metronidazole) .... One By Mouth  1 Qid X 7 Days  Current Medications (verified): 1)  Nexium 40 Mg  Cpdr (Esomeprazole Magnesium) .... Once Daily 2)  Uniretic 15-25 Mg  Tabs (Moexipril-Hydrochlorothiazide) .... Once Daily 3)  Multivitamin/iron 60 Mg  Chew (Pediatric Multivit-Minerals-C) .... Once Daily 4)  Depo-Testosterone 200 Mg/ml Oil (Testosterone Cypionate) .Marland Kitchen.. 1 Ml  Monthly 5)  Bd Disp Needles 20g X 1" Misc (Needle (Disp)) .... With 3 Cc Syringes- # 10 Called In 6)  Fexofenadine-Pseudoephedrine 60-120 Mg Xr12h-Tab  (Fexofenadine-Pseudoephedrine) .... One By Mouth Bid 7)  Elidel 1 % Crea (Pimecrolimus) .... Use As Directed 8)  Vytorin 10-40 Mg Tabs (Ezetimibe-Simvastatin) .... One By Mouth  Monady Wednesday and Friday 9)  Nasonex 50 Mcg/act Susp (Mometasone Furoate) .... Two Spray in Each Nostril  Daily 10)  Diclofenac Sodium 75 Mg Tbec (Diclofenac Sodium) .Marland Kitchen.. 1 Two Times A Day 11)  Phentermine Hcl 37.5 Mg Tabs (Phentermine Hcl) .... One By Mouth Daily  Allergies (verified): No Known Drug Allergies  Past History:  Family History: Last updated: 12/14/2006 Breast CA Family History Breast cancer 1st degree relative <50 Family History Diabetes 1st degree relative Family History Hypertension  Social History: Last updated: 12/09/2006 Occupation: Single Never Smoked  Risk Factors: Smoking Status: never (01/21/2010)  Past medical, surgical, family and social histories (including risk factors) reviewed, and no changes noted (except as noted below).  Past Medical History: Reviewed history from 12/09/2006 and no changes required. Hyperlipidemia Hypertension Allergic rhinitis GERD Colonic polyps, hx of  Past Surgical History: Reviewed history from 12/09/2006 and no changes required. Colonoscopy  Family History: Reviewed history from 12/14/2006 and no changes required. Breast CA Family History Breast cancer 1st degree relative <50 Family History Diabetes 1st degree relative Family History Hypertension  Social History: Reviewed history from 12/09/2006 and no changes required. Occupation: Single Never Smoked  Review of Systems  The patient denies anorexia, fever, weight loss, weight gain, vision loss, decreased hearing, hoarseness, chest pain, syncope, dyspnea on exertion, peripheral edema, prolonged cough, headaches, hemoptysis, abdominal pain, melena, hematochezia, severe indigestion/heartburn, hematuria, incontinence, genital sores, muscle weakness, suspicious skin lesions,  transient blindness, difficulty walking, depression, unusual weight change, abnormal bleeding, enlarged lymph nodes, angioedema, and breast masses.         Flu Vaccine Consent Questions     Do you have a history of severe allergic reactions to this vaccine? no    Any prior history of allergic reactions to egg and/or gelatin? no    Do you have a sensitivity to the preservative Thimersol? no    Do you have a past history of Guillan-Barre Syndrome? no    Do you currently have an acute febrile illness? no    Have you ever had a severe reaction to latex? no    Vaccine information given and explained to patient? yes    Are you currently pregnant? no    Lot Number:[id number]   Exp Date:09/14/2010   Site Given  Left Deltoid IM   Physical Exam  General:  Well-developed,well-nourished,in no acute distress; alert,appropriate and cooperative throughout examination Eyes:  pupils equal and pupils round.   Nose:  mucosal erythema, mucosal edema, and airflow obstruction.   Mouth:  posterior lymphoid hypertrophy and postnasal drip.   Neck:  No deformities, masses, or tenderness noted. Lungs:  Normal respiratory effort, chest expands symmetrically. Lungs are clear to auscultation, no crackles or wheezes. Heart:  Normal rate and regular rhythm. S1 and S2 normal without gallop, murmur, click, rub or other extra sounds. Abdomen:  Bowel sounds positive,abdomen soft and non-tender without masses, organomegaly or hernias noted. Msk:  No deformity or scoliosis noted of thoracic or lumbar spine.   Extremities:  No clubbing, cyanosis, edema, or deformity noted with normal full range of motion of all joints.   Neurologic:  alert & oriented X3, cranial nerves II-XII intact, and strength normal in all extremities.     Impression & Recommendations:  Problem # 1:  PLANTAR FASCIITIS (ICD-728.71)  His updated medication list for this problem includes:    Diclofenac Sodium 75 Mg Tbec (Diclofenac sodium) .Marland Kitchen... 1 two  times a day  Discussed use of gel inserts, ice massage, and stretching exercises.   Problem # 2:  RADIAL NERVE INJURY (ICD-955.3)  Problem # 3:  HYPERLIPIDEMIA (ICD-272.4) Assessment: Unchanged stable His updated medication list for this problem includes:    Vytorin 10-40 Mg Tabs (Ezetimibe-simvastatin) ..... One by mouth  monady wednesday and friday  Labs Reviewed: SGOT: 30 (08/20/2009)   SGPT: 36 (08/20/2009)  Lipid Goals: Chol Goal: 200 (04/19/2007)   HDL Goal: 40 (04/19/2007)   LDL Goal: 130 (08/02/2007)   TG Goal: 150 (04/19/2007)  Prior 10 Yr Risk Heart Disease: Not enough information (12/25/2008)   HDL:47.90 (08/20/2009), 39.70 (12/18/2008)  LDL:DEL (05/10/2008), DEL (11/01/2007)  Chol:223 (08/20/2009), 220 (12/18/2008)  Trig:169.0 (08/20/2009), 251.0 (12/18/2008)  Problem # 4:  HYPERTENSION (ICD-401.9) Assessment: Unchanged  His updated medication list for this problem includes:    Uniretic 15-25 Mg Tabs (Moexipril-hydrochlorothiazide) ..... Once daily  BP today: 140/80 Prior BP: 136/80 (08/27/2009)  Prior 10 Yr Risk Heart Disease: Not enough information (12/25/2008)  Labs Reviewed: K+: 3.6 (08/20/2009) Creat: : 0.8 (08/20/2009)   Chol: 223 (08/20/2009)   HDL: 47.90 (08/20/2009)   LDL: DEL (05/10/2008)   TG: 169.0 (08/20/2009)  Complete Medication List: 1)  Nexium 40 Mg Cpdr (Esomeprazole magnesium) .... Once daily 2)  Uniretic 15-25 Mg Tabs (Moexipril-hydrochlorothiazide) .... Once daily 3)  Multivitamin/iron 60 Mg Chew (Pediatric multivit-minerals-c) .... Once daily 4)  Depo-testosterone 200 Mg/ml Oil (Testosterone cypionate) .Marland Kitchen.. 1 ml  monthly 5)  Bd Disp Needles 20g X 1" Misc (Needle (disp)) .... With 3 cc syringes- # 10 called in 6)  Fexofenadine-pseudoephedrine 60-120 Mg Xr12h-tab (Fexofenadine-pseudoephedrine) .... One by mouth bid 7)  Elidel 1 % Crea (Pimecrolimus) .... Use as directed 8)  Vytorin 10-40 Mg Tabs (Ezetimibe-simvastatin) .... One by mouth   monady wednesday and friday 9)  Nasonex 50 Mcg/act Susp (Mometasone furoate) .... Two spray in each nostril  daily 10)  Diclofenac Sodium 75 Mg Tbec (Diclofenac sodium) .Marland Kitchen.. 1 two times a day 11)  Phentermine Hcl 37.5 Mg Tabs (Phentermine hcl) .... One by mouth daily  Other Orders: Admin 1st Vaccine (62130) Flu Vaccine 72yrs + [phone]) Admin of patients own med IM/SQ (46962X)  Patient Instructions: 1)  Please schedule a follow-up appointment in 3 months. Prescriptions: PHENTERMINE HCL 37.5 MG TABS (PHENTERMINE HCL) one by mouth daily  #30 x 3   Entered and Authorized by:   Stacie Glaze MD   Signed by:   Stacie Glaze MD on 01/21/2010   Method used:   Print then Give to Patient   RxID:   [id number]    Medication Administration  Injection # 1:    Medication: Testosterone Cypionat 200mg  ing    Diagnosis: HYPOGONADISM (ICD-257.2)    Route: IM    Site: LUOQ gluteus    Exp Date: 11/04/2011    Lot #: [id number]    Mfr: Claudette Laws  Comments: 1/2 ml given    Patient tolerated injection without complications    Given by: Willy Eddy, LPN (January 21, 2010 11:18 AM)  Orders Added: 1)  Admin 1st Vaccine [90471] 2)  Flu Vaccine 52yrs + [65784] 3)  Admin of patients own med IM/SQ [96372M] 4)  Est. Patient Level IV [69629]

## 2010-04-16 NOTE — Progress Notes (Signed)
Summary: med req  Phone Note Call from Patient   Reason for Call: Acute Illness Summary of Call: Head & chest congestion, more chest.  Yellow-green phlegm.  Think bronchitis.  Today [facility].  NKDA. Initial call taken by: Rudy Jew, RN,  June 01, 2009 9:38 AM  Follow-up for Phone Call        per dr Lovell Sheehan- m ay have z pack Follow-up by: Willy Eddy, LPN,  June 01, 2009 11:01 AM    New/Updated Medications: ZITHROMAX Z-PAK 250 MG TABS (AZITHROMYCIN) take as directed Prescriptions: ZITHROMAX Z-PAK 250 MG TABS (AZITHROMYCIN) take as directed  #1 x 0   Entered by:   Willy Eddy, LPN   Authorized by:   Stacie Glaze MD   Signed by:   Willy Eddy, LPN on 29/52/8413   Method used:   Electronically to        [facility] # [phone][facility] (retail)       [address] [place], [place]  [address]       Ph: [phone]       Fax: [phone]   RxID:   [id number]

## 2010-04-18 NOTE — Assessment & Plan Note (Signed)
Summary: sinus infection/dm   Vital Signs:  Patient profile:   50 year old male Weight:      240 pounds Temp:     98.2 degrees F Pulse rate:   66 / minute BP sitting:   128 / 90  Vitals Entered By: Kyung Rudd, CMA (March 12, 2010 11:39 AM) CC: ??sinus inf   Primary Care Provider:  Stacie Glaze MD  CC:  ??sinus inf.  History of Present Illness: Patient presents to clinic as a workin for evaluation of URI symptoms. States 4 d h/o sorethroat, bilateral ear burning without discharge and pain neck glands. +nasal congestion  but denies f/c or cough. No wheezing or dyspnea. No alleviating or exacerbating factors. No other complaints.  Current Medications (verified): 1)  Nexium 40 Mg  Cpdr (Esomeprazole Magnesium) .... Once Daily 2)  Uniretic 15-25 Mg  Tabs (Moexipril-Hydrochlorothiazide) .... Once Daily 3)  Multivitamin/iron 60 Mg  Chew (Pediatric Multivit-Minerals-C) .... Once Daily 4)  Depo-Testosterone 200 Mg/ml Oil (Testosterone Cypionate) .Marland Kitchen.. 1 Ml  Monthly 5)  Bd Disp Needles 20g X 1" Misc (Needle (Disp)) .... With 3 Cc Syringes- # 10 Called In 6)  Fexofenadine-Pseudoephedrine 60-120 Mg Xr12h-Tab (Fexofenadine-Pseudoephedrine) .... One By Mouth Bid 7)  Elidel 1 % Crea (Pimecrolimus) .... Use As Directed 8)  Vytorin 10-40 Mg Tabs (Ezetimibe-Simvastatin) .... One By Mouth  Monady Wednesday and Friday 9)  Nasonex 50 Mcg/act Susp (Mometasone Furoate) .... Two Spray in Each Nostril  Daily 10)  Diclofenac Sodium 75 Mg Tbec (Diclofenac Sodium) .Marland Kitchen.. 1 Two Times A Day 11)  Phentermine Hcl 37.5 Mg Tabs (Phentermine Hcl) .... One By Mouth Daily  Allergies (verified): No Known Drug Allergies  Social History: Reviewed history from 12/09/2006 and no changes required. Occupation: Single Never Smoked  Review of Systems      See HPI  Physical Exam  General:  Well-developed,well-nourished,in no acute distress; alert,appropriate and cooperative throughout examination Head:   Normocephalic and atraumatic without obvious abnormalities. No apparent alopecia or balding. Ears:  External ear exam shows no significant lesions or deformities.  Otoscopic examination reveals clear canals, tympanic membranes are intact bilaterally without bulging, retraction, inflammation or discharge. Hearing is grossly normal bilaterally. Nose:  External nasal examination shows no deformity or inflammation. Nasal mucosa are pink and moist without lesions or exudates. Mouth:  Oral mucosa and oropharynx without lesions or exudates.  Teeth in good repair. Neck:  No deformities, masses, or tenderness noted. Lungs:  Normal respiratory effort, chest expands symmetrically. Lungs are clear to auscultation, no crackles or wheezes. Heart:  Normal rate and regular rhythm. S1 and S2 normal without gallop, murmur, click, rub or other extra sounds.    Impression & Recommendations:  Problem # 1:  URI (ICD-465.9) Assessment New Suspect viral etiology. Recommend otc symptomatic tx. Given abx prescription to hold. Begin if symptoms do not improve after total of 8-10 days. States understanding.  Followup if no improvement or worsening.  Complete Medication List: 1)  Nexium 40 Mg Cpdr (Esomeprazole magnesium) .... Once daily 2)  Uniretic 15-25 Mg Tabs (Moexipril-hydrochlorothiazide) .... Once daily 3)  Multivitamin/iron 60 Mg Chew (Pediatric multivit-minerals-c) .... Once daily 4)  Depo-testosterone 200 Mg/ml Oil (Testosterone cypionate) .Marland Kitchen.. 1 ml  monthly 5)  Bd Disp Needles 20g X 1" Misc (Needle (disp)) .... With 3 cc syringes- # 10 called in 6)  Fexofenadine-pseudoephedrine 60-120 Mg Xr12h-tab (Fexofenadine-pseudoephedrine) .... One by mouth bid 7)  Elidel 1 % Crea (Pimecrolimus) .... Use as directed 8)  Vytorin  10-40 Mg Tabs (Ezetimibe-simvastatin) .... One by mouth  monady wednesday and friday 9)  Nasonex 50 Mcg/act Susp (Mometasone furoate) .... Two spray in each nostril  daily 10)  Diclofenac Sodium  75 Mg Tbec (Diclofenac sodium) .Marland Kitchen.. 1 two times a day 11)  Phentermine Hcl 37.5 Mg Tabs (Phentermine hcl) .... One by mouth daily 12)  Zithromax Z-pak 250 Mg Tabs (Azithromycin) .... As directed Prescriptions: ZITHROMAX Z-PAK 250 MG TABS (AZITHROMYCIN) as directed  #1 x 0   Entered and Authorized by:   Edwyna Perfect MD   Signed by:   Edwyna Perfect MD on 03/12/2010   Method used:   Print then Give to Patient   RxID:   [id number]    Orders Added: 1)  Est. Patient Level II [78469]

## 2010-04-18 NOTE — Progress Notes (Signed)
Summary: depression  Phone Note Call from Patient Call back at Work Phone [phone]   Caller: Patient----triage vm Complaint: Breathing Problems Summary of Call: Having depression. wants something to take prior to his 04-22-2010 appt with Dr Lovell Sheehan. Please advise. Initial call taken by: Warnell Forester,  April 11, 2010 2:49 PM  Follow-up for Phone Call        pt aware it will be friday am before he looks at messages--pt states he has had some deaths in family lately. Follow-up by: Willy Eddy, LPN,  April 11, 2010 3:58 PM    New/Updated Medications: CITALOPRAM HYDROBROMIDE 20 MG TABS (CITALOPRAM HYDROBROMIDE) 1 once daily Prescriptions: CITALOPRAM HYDROBROMIDE 20 MG TABS (CITALOPRAM HYDROBROMIDE) 1 once daily  #30 x 6   Entered by:   Willy Eddy, LPN   Authorized by:   Stacie Glaze MD   Signed by:   Willy Eddy, LPN on 14/78/2956   Method used:   Electronically to        [facility] # [phone]* (retail)       [address] [place], [place]  [address]       Ph: [phone]       Fax: [phone]   RxID:   [id number]

## 2010-04-22 ENCOUNTER — Encounter: Payer: Self-pay | Admitting: Internal Medicine

## 2010-04-22 ENCOUNTER — Ambulatory Visit (INDEPENDENT_AMBULATORY_CARE_PROVIDER_SITE_OTHER): Payer: BC Managed Care – PPO | Admitting: Internal Medicine

## 2010-04-22 DIAGNOSIS — E291 Testicular hypofunction: Secondary | ICD-10-CM

## 2010-04-22 DIAGNOSIS — F3342 Major depressive disorder, recurrent, in full remission: Secondary | ICD-10-CM | POA: Insufficient documentation

## 2010-04-22 DIAGNOSIS — F330 Major depressive disorder, recurrent, mild: Secondary | ICD-10-CM

## 2010-04-22 DIAGNOSIS — K219 Gastro-esophageal reflux disease without esophagitis: Secondary | ICD-10-CM

## 2010-04-22 DIAGNOSIS — I10 Essential (primary) hypertension: Secondary | ICD-10-CM

## 2010-04-22 DIAGNOSIS — E785 Hyperlipidemia, unspecified: Secondary | ICD-10-CM

## 2010-04-22 MED ORDER — DULOXETINE HCL 60 MG PO CPEP: 60.0000 mg | ORAL_CAPSULE | Freq: Every day | ORAL | Status: DC

## 2010-04-22 NOTE — Progress Notes (Signed)
  Subjective:    Patient ID: Jason Lopez, male    DOB: 05-Sep-1960, 50 y.o.   MRN: [id number]  HPI  patient is a 50 year old white male who presents for a return office visit for followup of chronic medical problems including hyperlipidemia hypertension chronic esophageal reflux disease and most recently worsening depression anxiety.   The patient has had significant recent increased stressors including the loss of a good friend to cancer and another friend who has a progressive terminal illness in by niece with a history of operations family this is a significant stressor to this individual.   there is apparent weight gain probable stress eating increased anxiety. The standpoint of his chronic problems he appears stable his blood pressure is good he is compliant with his medications he does miss some of his testosterone injections in terms of the exact timing. He denies suicidal ideology   Review of Systems  Constitutional: Negative for fever and fatigue.  HENT: Negative for hearing loss, congestion, neck pain and postnasal drip.   Eyes: Negative for discharge, redness and visual disturbance.  Respiratory: Negative for cough, shortness of breath and wheezing.   Cardiovascular: Negative for leg swelling.  Gastrointestinal: Negative for abdominal pain, constipation and abdominal distention.  Genitourinary: Negative for urgency and frequency.  Musculoskeletal: Negative for joint swelling and arthralgias.  Skin: Negative for color change and rash.  Neurological: Negative for weakness and light-headedness.  Hematological: Negative for adenopathy.  Psychiatric/Behavioral: Negative for behavioral problems.    his past medical history medications family history social history allergies were reviewed.    Objective:   Physical Exam  Constitutional: He appears well-developed and well-nourished.  HENT:  Head: Normocephalic and atraumatic.  Right Ear: External ear normal.  Left Ear: External  ear normal.  Eyes: Conjunctivae are normal. Pupils are equal, round, and reactive to light.  Neck: Normal range of motion. Neck supple.  Cardiovascular: Normal rate and regular rhythm.   Pulmonary/Chest: Effort normal and breath sounds normal.  Abdominal: Soft. Bowel sounds are normal.  Neurological: He is alert. He has normal reflexes.  Psychiatric: His speech is normal. Judgment and thought content normal. His affect is blunt. He is withdrawn. He exhibits a depressed mood.          Assessment & Plan:   1.  Hypertension his blood pressure stable on his current medications the changes are anticipated 2 hypogonadism he is encouraged to take his injections on  A more regular basis a testosterone level will be checked next visit.       3. Reactive depression it initially felt the Celexa he has situational depression on top of probable chemical depression we will place him on Cymbalta 60 mg one by mouth daily as a trial for 4 weeks with a followup in 4 weeks.  I have spent 30 minutes face-to-face with this patient of which over half was in counseling.

## 2010-05-12 ENCOUNTER — Other Ambulatory Visit: Payer: Self-pay | Admitting: Internal Medicine

## 2010-07-05 ENCOUNTER — Telehealth: Payer: Self-pay | Admitting: Internal Medicine

## 2010-07-05 DIAGNOSIS — F330 Major depressive disorder, recurrent, mild: Secondary | ICD-10-CM

## 2010-07-05 MED ORDER — DULOXETINE HCL 60 MG PO CPEP: 60.0000 mg | ORAL_CAPSULE | Freq: Every day | ORAL | Status: DC

## 2010-07-05 NOTE — Telephone Encounter (Signed)
None available. Pt informed and script  [facility] to [facility]

## 2010-07-05 NOTE — Telephone Encounter (Signed)
Pt needs samples of cymbalta 60mg  or call new rx [facility] [phone]

## 2010-07-17 ENCOUNTER — Encounter: Payer: Self-pay | Admitting: Internal Medicine

## 2010-07-22 ENCOUNTER — Ambulatory Visit: Payer: BC Managed Care – PPO | Admitting: Internal Medicine

## 2010-08-02 NOTE — Op Note (Signed)
NAME:  Jason Lopez, Jason Lopez A                        ACCOUNT NO.:  [id number]   MEDICAL RECORD NO.:  [id number]                   PATIENT TYPE:  AMB   LOCATION:  ENDO                                 FACILITY:  [facility]   PHYSICIAN:  Anselmo Rod, M.D.               DATE OF BIRTH:  [birth year]   DATE OF PROCEDURE:  02/13/2003  DATE OF DISCHARGE:                                 OPERATIVE REPORT   PROCEDURE:  Colonoscopy with cold biopsies x6, endoscopy.   ENDOSCOPIST:  Anselmo Rod, M.D.   INSTRUMENT:  Olympus video colonoscope.   INDICATIONS FOR PROCEDURE:  Rectal bleeding in a 50 year old white male.  Rule out any colonic polyps, masses, etc.   PRE-PROCEDURE PREPARATION:  Informed consent was procured from the patient.  Patient fasted for 8 hours prior to the procedure and prepped with a bottle  of magnesium citrate and a gallon of GoLYTELY the night prior to the  procedure.  Pre-procedure physical:  Patient had stable vital signs, neck  supple, chest clear to auscultation, S1/S2 regular,  abdomen soft with  normal bowel sounds.   DESCRIPTION OF PROCEDURE:  The patient was placed in the left lateral  decubitus position, sedated with 100 mg of Demerol and 10 mg of Versed in  slow, incremental doses.  Once the patient was adequately sedated and  maintained on low flow oxygen, and continuous cardiac monitoring; the  Olympus video colonoscope was advanced from the rectum to the cecum without  difficulty.  The patient had some residual stool in the colon, especially on  the right side including the cecum.   The patient's position was changed from the left lateral to the supine  position to mobilize the stool.  Colon visualized underlying mucosa. The  appendiceal orifice and the ileocecal valve were clearly visualized,  photographed. The terminal ileum appeared normal without lesions. Two small  sessile polyps were biopsied from 55 and 45 cm and placed in the same  bottle.  Small  internal hemorrhoids were seen on retroflexion in the rectum.  Anal inspection showed small external hemorrhoids.  There was no evidence of  diverticulosis. The patient tolerated the procedure well without  complications.   IMPRESSION:  1. Small, nonbleeding internal and external hemorrhoids.  2. Two small sessile polyps, biopsied from 45 and 55 cm respectively.  3. Some residual stool in the right colon, multiple washes done.  4. Normal-appearing transverse colon, right colon, cecum and terminal ileum.   RECOMMENDATIONS:  1. Await pathology results.  2. Avoid all nonsteroidals including aspirin for the next 3-4 weeks.  3. A high fiber diet with liberal fluids intake.  4. Repeat CRC screening depending on the pathology results.  Anselmo Rod, M.D.    JNM/MEDQ  D:  02/13/2003  T:  02/13/2003  Job:  [id number]   cc:   Stacie Glaze, M.D. [facility]

## 2010-08-19 ENCOUNTER — Other Ambulatory Visit: Payer: Self-pay | Admitting: Internal Medicine

## 2010-08-23 ENCOUNTER — Other Ambulatory Visit: Payer: Self-pay | Admitting: Internal Medicine

## 2010-10-16 ENCOUNTER — Telehealth: Payer: Self-pay | Admitting: Internal Medicine

## 2010-10-16 MED ORDER — DULOXETINE HCL 30 MG PO CPEP: 30.0000 mg | ORAL_CAPSULE | Freq: Every day | ORAL | Status: DC

## 2010-10-16 NOTE — Telephone Encounter (Signed)
Pt. Would like to know the best way for him to go about getting off Cymbalta. Please contact.

## 2010-10-16 NOTE — Telephone Encounter (Signed)
Pt. Notified.

## 2010-10-16 NOTE — Telephone Encounter (Signed)
Per dr Lovell Sheehan- he is on 60 mg ,so take 30 mg for 2 weeks and then 30 mg every other day and then can stop

## 2010-11-15 ENCOUNTER — Telehealth: Payer: Self-pay | Admitting: *Deleted

## 2010-11-15 NOTE — Telephone Encounter (Signed)
Left detailed message on pt's personal vice mail.

## 2010-11-15 NOTE — Telephone Encounter (Signed)
This may be coming from anxiety and not coming off medication- if it continues , he may need to go back on cymbalta

## 2010-11-15 NOTE — Telephone Encounter (Signed)
Pt has been weaning off Cymbalta, and is now taking 30 mg. Cymbalta q o day, and is having palpitations and feeling jittery ???  Any suggestions?  Pt was on 30 mg. q day x 2 weeks.

## 2010-12-05 ENCOUNTER — Telehealth: Payer: Self-pay | Admitting: *Deleted

## 2010-12-05 MED ORDER — CLONAZEPAM 0.5 MG PO TABS: 0.5000 mg | ORAL_TABLET | ORAL | Status: DC

## 2010-12-05 NOTE — Telephone Encounter (Signed)
Pt is wanting to go off Cymbalta, but when he weans, he is still getting anxious.

## 2010-12-05 NOTE — Telephone Encounter (Signed)
Pt has been off Cymbalta x 3 days, and will use the Clonazepam if he has a problem this time.  If he does continue having anxiety, we may have to give him a few more Cymbalta to finish up the weaning process.  The reason he wants to go off of Cymbalta is due to weight gain.

## 2010-12-05 NOTE — Telephone Encounter (Signed)
LMTCB

## 2010-12-05 NOTE — Telephone Encounter (Signed)
If he gets anxious when he tries to get off Cymbalta it may be that he needs to be on Cymbalta. The question is does he need to get off Cymbalta because of side effects and if so do we need to transition him to another drug that may not have the same side effects that is the problem then we can help him with that if he is determined to get off the Cymbalta we may have to use a short course of an anxiety a lytic such as clonazepam 0.5 mg by mouth daily while he titrates off the medication.

## 2010-12-23 ENCOUNTER — Ambulatory Visit (INDEPENDENT_AMBULATORY_CARE_PROVIDER_SITE_OTHER): Payer: BC Managed Care – PPO | Admitting: Internal Medicine

## 2010-12-23 ENCOUNTER — Encounter: Payer: Self-pay | Admitting: Internal Medicine

## 2010-12-23 VITALS — BP 130/80 | HR 76 | Temp 98.7°F | Resp 16 | Ht 74.0 in | Wt 259.0 lb

## 2010-12-23 DIAGNOSIS — E669 Obesity, unspecified: Secondary | ICD-10-CM

## 2010-12-23 DIAGNOSIS — I10 Essential (primary) hypertension: Secondary | ICD-10-CM

## 2010-12-23 DIAGNOSIS — J019 Acute sinusitis, unspecified: Secondary | ICD-10-CM

## 2010-12-23 DIAGNOSIS — J3089 Other allergic rhinitis: Secondary | ICD-10-CM

## 2010-12-23 DIAGNOSIS — E291 Testicular hypofunction: Secondary | ICD-10-CM

## 2010-12-23 MED ORDER — FLUTICASONE PROPIONATE 50 MCG/ACT NA SUSP: 2.0000 | Freq: Every day | NASAL | Status: DC

## 2010-12-23 MED ORDER — LEVOFLOXACIN 500 MG PO TABS: 500.0000 mg | ORAL_TABLET | Freq: Every day | ORAL | Status: AC

## 2010-12-23 MED ORDER — "NEEDLE (DISP) 20G X 1"" MISC": 20.0000 g | Status: DC

## 2010-12-23 MED ORDER — TESTOSTERONE CYPIONATE 200 MG/ML IM SOLN
200.0000 mg | INTRAMUSCULAR | Status: DC
  Administered 2010-12-23: 200 mg via INTRAMUSCULAR

## 2010-12-23 MED ORDER — PHENTERMINE HCL 37.5 MG PO CAPS: 37.5000 mg | ORAL_CAPSULE | ORAL | Status: DC

## 2010-12-23 NOTE — Patient Instructions (Signed)
Patient was instructed to continue all medications as prescribed. To stop at the checkout desk and schedule a followup appointment  

## 2010-12-23 NOTE — Progress Notes (Signed)
  Subjective:    Patient ID: [name], male    DOB: 03/22/1960, 50 y.o.   MRN: [id number]  HPI Off cymbalta and doing well The pt has increased chest congestion and ST, cough and nasal congestion Discharge was clear but now has gone into his chest. He is interested in using the phentermine again for weight loss Blood pressure is elevated due to decongestant use.   Review of Systems  Constitutional: Negative for fever and fatigue.  HENT: Negative for hearing loss, congestion, neck pain and postnasal drip.   Eyes: Negative for discharge, redness and visual disturbance.  Respiratory: Negative for cough, shortness of breath and wheezing.   Cardiovascular: Negative for leg swelling.  Gastrointestinal: Negative for abdominal pain, constipation and abdominal distention.  Genitourinary: Negative for urgency and frequency.  Musculoskeletal: Negative for joint swelling and arthralgias.  Skin: Negative for color change and rash.  Neurological: Negative for weakness and light-headedness.  Hematological: Negative for adenopathy.  Psychiatric/Behavioral: Negative for behavioral problems.   Past Medical History  Diagnosis Date  . HYPOGONADISM 01/08/2010  . HYPERLIPIDEMIA 12/09/2006  . ERECTILE DYSFUNCTION, MILD 08/02/2007  . HYPERTENSION 12/09/2006  . GERD 12/09/2006  . Anal and rectal polyp 08/27/2009  . PLANTAR FASCIITIS 01/21/2010  . RADIAL NERVE INJURY 01/21/2010  . COLONIC POLYPS, HX OF 12/09/2006   Past Surgical History  Procedure Date  . Colonoscopy 2008    reports that he has never smoked. He does not have any smokeless tobacco history on file. He reports that he drinks alcohol. He reports that he does not use illicit drugs. family history includes Breast cancer in an unspecified family member; Cancer in an unspecified family member; Diabetes in an unspecified family member; and Hypertension in an unspecified family member. No Known Allergies     Objective:   Physical Exam    Nursing note and vitals reviewed. Constitutional: He appears well-developed and well-nourished.  HENT:  Head: Normocephalic and atraumatic.       Swollen turbunates and posterior cobblestoning   Eyes: Conjunctivae are normal. Pupils are equal, round, and reactive to light.  Neck: Normal range of motion. Neck supple.  Cardiovascular: Normal rate and regular rhythm.   Pulmonary/Chest: Effort normal and breath sounds normal.  Abdominal: Soft. Bowel sounds are normal.          Assessment & Plan:  Treatment with antibiotics and Nasonex for bronchitis complicated by acute sinusitis.  I believe that the bronchitis is probably viral in etiology complicated by bacterial sinusitis. An antibiotic was given to the patient for 10 day course.  Patient is stable off antidepressants.  Patient's blood pressure stable.  Patient's GERD is stable The phentermine was prescribed for weight loss protocol

## 2011-04-12 ENCOUNTER — Other Ambulatory Visit: Payer: Self-pay | Admitting: Internal Medicine

## 2011-04-28 ENCOUNTER — Ambulatory Visit (INDEPENDENT_AMBULATORY_CARE_PROVIDER_SITE_OTHER): Payer: BC Managed Care – PPO | Admitting: Internal Medicine

## 2011-04-28 ENCOUNTER — Encounter: Payer: Self-pay | Admitting: Internal Medicine

## 2011-04-28 VITALS — BP 140/80 | HR 76 | Temp 98.2°F | Resp 16 | Ht 74.0 in | Wt 252.0 lb

## 2011-04-28 DIAGNOSIS — I10 Essential (primary) hypertension: Secondary | ICD-10-CM

## 2011-04-28 DIAGNOSIS — E349 Endocrine disorder, unspecified: Secondary | ICD-10-CM

## 2011-04-28 DIAGNOSIS — J209 Acute bronchitis, unspecified: Secondary | ICD-10-CM

## 2011-04-28 DIAGNOSIS — E291 Testicular hypofunction: Secondary | ICD-10-CM

## 2011-04-28 MED ORDER — TESTOSTERONE 20.25 MG/1.25GM (1.62%) TD GEL: 2.0000 "application " | Freq: Every day | TRANSDERMAL | Status: DC

## 2011-04-28 MED ORDER — AZITHROMYCIN 250 MG PO TABS: ORAL_TABLET | ORAL | Status: DC

## 2011-04-28 NOTE — Progress Notes (Signed)
Subjective:    Patient ID: Jason Lopez, male    DOB: October 12, 1960, 51 y.o.   MRN: [id number]  HPI Patient is a 51 year old white male presents for followup of hyperlipidemia hypertension GERD and hypogonadism.  He has been off his testosterone for several months because he could not afford the co-pay for his injections.  His blood pressure stabilized her medications has been compliant with those he did not take any weight loss medications including differential impairment he has joined a gym and has been exercising.  His gastroesophageal reflux is stable. His acute upper respiratory tract infection with sore throat fever chills productive cough for over 7 days duration   Review of Systems  Constitutional: Positive for fever. Negative for fatigue.  HENT: Positive for congestion, rhinorrhea, postnasal drip and sinus pressure. Negative for hearing loss and neck pain.   Eyes: Negative for discharge, redness and visual disturbance.  Respiratory: Positive for cough. Negative for shortness of breath and wheezing.   Cardiovascular: Negative for leg swelling.  Gastrointestinal: Negative for abdominal pain, constipation and abdominal distention.  Genitourinary: Negative for urgency and frequency.  Musculoskeletal: Negative for joint swelling and arthralgias.  Skin: Negative for color change and rash.  Neurological: Negative for weakness and light-headedness.  Hematological: Negative for adenopathy.  Psychiatric/Behavioral: Negative for behavioral problems.   Past Medical History  Diagnosis Date  . HYPOGONADISM 01/08/2010  . HYPERLIPIDEMIA 12/09/2006  . ERECTILE DYSFUNCTION, MILD 08/02/2007  . HYPERTENSION 12/09/2006  . GERD 12/09/2006  . Anal and rectal polyp 08/27/2009  . PLANTAR FASCIITIS 01/21/2010  . RADIAL NERVE INJURY 01/21/2010  . COLONIC POLYPS, HX OF 12/09/2006    History   Social History  . Marital Status: Single    Spouse Name: N/A    Number of Children: N/A  . Years of  Education: N/A   Occupational History  . OWNER   . Scientist, research (medical)    Social History Main Topics  . Smoking status: Never Smoker   . Smokeless tobacco: Not on file  . Alcohol Use: Yes  . Drug Use: No  . Sexually Active: Not on file   Other Topics Concern  . Not on file   Social History Narrative  . No narrative on file    Past Surgical History  Procedure Date  . Colonoscopy 2008    Family History  Problem Relation Age of Onset  . Breast cancer    . Cancer    . Diabetes    . Hypertension      No Known Allergies  Current Outpatient Prescriptions on File Prior to Visit  Medication Sig Dispense Refill  . clonazePAM (KLONOPIN) 0.5 MG tablet Take 1 tablet (0.5 mg total) by mouth 1 day or 1 dose.  30 tablet  0  . ELIDEL 1 % cream USE AS DIRECTED  30 g  0  . ezetimibe-simvastatin (VYTORIN) 10-40 MG per tablet Take 1 tablet by mouth. 1 tab 3 times a week      . fexofenadine-pseudoephedrine (ALLEGRA-D) 60-120 MG per tablet Take 1 tablet by mouth 2 (two) times daily.        . fluticasone (FLONASE) 50 MCG/ACT nasal spray Place 2 sprays into the nose daily.  16 g  12  . moexipril-hydrochlorothiazide (UNIRETIC) 15-25 MG per tablet TAKE 1/2 TABLET BY MOUTH EVERY DAY  30 tablet  5  . Multiple Vitamin (MULTIVITAMIN) tablet Take 1 tablet by mouth daily.        Marland Kitchen NEEDLE, DISP, 20 G (BD DISP NEEDLES)  20G X 1" MISC 20 g by Does not apply route every 30 (thirty) days. Use monthly when giving testosterone  12 each  1  . NEXIUM 40 MG capsule TAKE 1 CAPSULE BY MOUTH EVERY DAY  30 capsule  1   Current Facility-Administered Medications on File Prior to Visit  Medication Dose Route Frequency Provider Last Rate Last Dose  . DISCONTD: testosterone cypionate (DEPOTESTOTERONE CYPIONATE) injection 200 mg  200 mg Intramuscular Q28 days Carrie Mew, MD   200 mg at 12/23/10 1054    BP 140/80  Pulse 76  Temp 98.2 F (36.8 C)  Resp 16  Ht 6\' 2"  (1.88 m)  Wt 252 lb (114.306 kg)  BMI 32.35  kg/m2       Objective:   Physical Exam  Nursing note and vitals reviewed. Constitutional: He appears well-developed and well-nourished.  HENT:  Head: Normocephalic and atraumatic.       Evidence for swollen and erythematous posterior cobblestoning is present  Eyes: Conjunctivae are normal. Pupils are equal, round, and reactive to light.  Neck: Normal range of motion. Neck supple.  Cardiovascular: Normal rate and regular rhythm.   Pulmonary/Chest: Effort normal and breath sounds normal.  Abdominal: Soft. Bowel sounds are normal.          Assessment & Plan:  Acute bronchitis will treat him with azithromycin Z-Pak course and suggested that he get over-the-counter Mucinex DM for his cough and congestion.  His blood pressure stable his current medications we have asked her to avoid decongestants because of his hypertension.  His gastroesophageal reflux is stable. We will change him to the topical AndroGel which is covered by his formulary now in a portable manner so that he continue testosterone replacement topically rather than by injection.

## 2011-04-28 NOTE — Patient Instructions (Signed)
The patient is instructed to continue all medications as prescribed. Schedule followup with check out clerk upon leaving the clinic  

## 2011-05-25 ENCOUNTER — Other Ambulatory Visit: Payer: Self-pay | Admitting: Internal Medicine

## 2011-07-28 ENCOUNTER — Ambulatory Visit: Payer: BC Managed Care – PPO | Admitting: Internal Medicine

## 2011-08-29 ENCOUNTER — Other Ambulatory Visit: Payer: Self-pay | Admitting: Internal Medicine

## 2011-09-15 ENCOUNTER — Encounter: Payer: Self-pay | Admitting: Internal Medicine

## 2011-09-15 ENCOUNTER — Ambulatory Visit (INDEPENDENT_AMBULATORY_CARE_PROVIDER_SITE_OTHER): Payer: BC Managed Care – PPO | Admitting: Internal Medicine

## 2011-09-15 VITALS — BP 140/80 | HR 72 | Temp 98.1°F | Resp 16 | Ht 74.0 in | Wt 254.0 lb

## 2011-09-15 DIAGNOSIS — T887XXA Unspecified adverse effect of drug or medicament, initial encounter: Secondary | ICD-10-CM

## 2011-09-15 DIAGNOSIS — I10 Essential (primary) hypertension: Secondary | ICD-10-CM

## 2011-09-15 DIAGNOSIS — E291 Testicular hypofunction: Secondary | ICD-10-CM

## 2011-09-15 DIAGNOSIS — K219 Gastro-esophageal reflux disease without esophagitis: Secondary | ICD-10-CM

## 2011-09-15 LAB — CBC
HCT: 45.6 % (ref 39.0–52.0)
Hemoglobin: 15.2 g/dL (ref 13.0–17.0)
Platelets: 200 10*3/uL (ref 150.0–400.0)
WBC: 6.3 10*3/uL (ref 4.5–10.5)

## 2011-09-15 LAB — TESTOSTERONE: Testosterone: 632.76 ng/dL (ref 350.00–890.00)

## 2011-09-15 LAB — HEPATIC FUNCTION PANEL
AST: 29 U/L (ref 0–37)
Bilirubin, Direct: 0 mg/dL (ref 0.0–0.3)
Total Bilirubin: 0.8 mg/dL (ref 0.3–1.2)

## 2011-09-15 NOTE — Progress Notes (Signed)
Subjective:    Patient ID: [name], male    DOB: [birth year], 51 y.o.   MRN: [id number]  HPI  follow up for GI Increased reflux requiring Nexium on a daily basis.  Often has TUMS for breakthrough pain.  Patient also has increased gas and abdominal distention.  The patient's blood pressure is well-controlled denies chest pain shortness of breath PND or orthopnea Patient is due for measurement of testosterone on testosterone replacement  Review of Systems  Constitutional: Negative for fever and fatigue.  HENT: Negative for hearing loss, congestion, neck pain and postnasal drip.   Eyes: Negative for discharge, redness and visual disturbance.  Respiratory: Negative for cough, shortness of breath and wheezing.   Cardiovascular: Negative for leg swelling.  Gastrointestinal: Negative for abdominal pain, constipation and abdominal distention.  Genitourinary: Negative for urgency and frequency.  Musculoskeletal: Negative for joint swelling and arthralgias.  Skin: Negative for color change and rash.  Neurological: Negative for weakness and light-headedness.  Hematological: Negative for adenopathy.  Psychiatric/Behavioral: Negative for behavioral problems.   Past Medical History  Diagnosis Date  . HYPOGONADISM 01/08/2010  . HYPERLIPIDEMIA 12/09/2006  . ERECTILE DYSFUNCTION, MILD 08/02/2007  . HYPERTENSION 12/09/2006  . GERD 12/09/2006  . Anal and rectal polyp 08/27/2009  . PLANTAR FASCIITIS 01/21/2010  . RADIAL NERVE INJURY 01/21/2010  . COLONIC POLYPS, HX OF 12/09/2006    History   Social History  . Marital Status: Single    Spouse Name: N/A    Number of Children: N/A  . Years of Education: N/A   Occupational History  . OWNER   . Scientist, research (medical)    Social History Main Topics  . Smoking status: Never Smoker   . Smokeless tobacco: Not on file  . Alcohol Use: Yes  . Drug Use: No  . Sexually Active: Not on file   Other Topics Concern  . Not on file   Social History  Narrative  . No narrative on file    Past Surgical History  Procedure Date  . Colonoscopy 2008    Family History  Problem Relation Age of Onset  . Breast cancer    . Cancer    . Diabetes    . Hypertension      No Known Allergies  Current Outpatient Prescriptions on File Prior to Visit  Medication Sig Dispense Refill  . clonazePAM (KLONOPIN) 0.5 MG tablet Take 1 tablet (0.5 mg total) by mouth 1 day or 1 dose.  30 tablet  0  . ELIDEL 1 % cream USE AS DIRECTED  30 g  0  . ezetimibe-simvastatin (VYTORIN) 10-40 MG per tablet Take 1 tablet by mouth. 1 tab 3 times a week      . fexofenadine-pseudoephedrine (ALLEGRA-D) 60-120 MG per tablet Take 1 tablet by mouth 2 (two) times daily.        . fluticasone (FLONASE) 50 MCG/ACT nasal spray Place 2 sprays into the nose daily.  16 g  12  . meloxicam (MOBIC) 7.5 MG tablet Take 7.5 mg by mouth daily.      . moexipril-hydrochlorothiazide (UNIRETIC) 15-25 MG per tablet TAKE 1/2 TABLET BY MOUTH EVERY DAY  30 tablet  5  . Multiple Vitamin (MULTIVITAMIN) tablet Take 1 tablet by mouth daily.        Marland Kitchen NEXIUM 40 MG capsule TAKE 1 CAPSULE BY MOUTH EVERY DAY  30 capsule  1  . Testosterone (ANDROGEL) 20.25 MG/1.25GM (1.62%) GEL Place 2 application onto the skin daily.  1.25 g  5  BP 140/80  Pulse 72  Temp 98.1 F (36.7 C)  Resp 16  Ht 6\' 2"  (1.88 m)  Wt 254 lb (115.214 kg)  BMI 32.61 kg/m2        Objective:   Physical Exam  Nursing note and vitals reviewed. Constitutional: He appears well-developed and well-nourished.  HENT:  Head: Normocephalic and atraumatic.  Eyes: Conjunctivae are normal. Pupils are equal, round, and reactive to light.  Neck: Normal range of motion. Neck supple.  Cardiovascular: Normal rate and regular rhythm.   Pulmonary/Chest: Effort normal and breath sounds normal.  Abdominal: Soft. Bowel sounds are normal.          Assessment & Plan:  Follow up GI reviewed the diet change of Paleo diet Reviewed blood  pressure control currently stable on current medications.  Discussed GERD and chronic gastritis and possible limitation of gluten as a trial  I have spent more than 30 minutes examining this patient face-to-face of which over half was spent in counseling  Mr. testosterone testosterone replacement for hypogonadism

## 2011-09-15 NOTE — Patient Instructions (Signed)
The patient is instructed to continue all medications as prescribed. Schedule followup with check out clerk upon leaving the clinic  

## 2011-09-29 ENCOUNTER — Telehealth: Payer: Self-pay | Admitting: Internal Medicine

## 2011-09-29 NOTE — Telephone Encounter (Signed)
I think she means Androgel.

## 2011-09-29 NOTE — Telephone Encounter (Signed)
Caller: Key/Patient; Phone Number: [phone]; Message from caller: Patient has an Accugel pump.  He has currently hit the limit on his insurance and this medication is now $150 instead of $10.  He is asking if there is another type of medication that would be less expensive.

## 2011-09-30 NOTE — Telephone Encounter (Signed)
Pt h as hit medication m ax with androgel- offered coupon for 50 dollars which would still be a payment of 100 dollars or [facility]- pt will contact us with his decision

## 2011-10-01 ENCOUNTER — Telehealth: Payer: Self-pay | Admitting: *Deleted

## 2011-10-01 NOTE — Telephone Encounter (Signed)
done

## 2011-10-17 ENCOUNTER — Telehealth: Payer: Self-pay | Admitting: Internal Medicine

## 2011-10-17 NOTE — Telephone Encounter (Signed)
Caller: Lealon/Patient is calling with a question about Androgel.The medication was written by Darryll Capers. Concerned about possible side effect, fatigue, on generic Androgel compound.  Started generic 10/07/11.  Asking if it is related to the change from brand name to generic.  Does it need to be evaluated?  Onset: 10/11/11.   Afebrile.  Declined triage; prefers to talk with Kendal Hymen since she already knows his background. Environmental education officer on [employer] Info noted and sent to [facility] for call back to adult with questions about RX med not covered by available resources per Med Questions Call Guideline.

## 2011-10-21 NOTE — Telephone Encounter (Signed)
Per dr Lovell Sheehan- maybe coming from change- [facility] and and discuss

## 2011-11-19 ENCOUNTER — Other Ambulatory Visit: Payer: Self-pay | Admitting: Internal Medicine

## 2012-01-15 ENCOUNTER — Encounter: Payer: Self-pay | Admitting: Family Medicine

## 2012-01-15 ENCOUNTER — Telehealth: Payer: Self-pay | Admitting: Internal Medicine

## 2012-01-15 ENCOUNTER — Ambulatory Visit (INDEPENDENT_AMBULATORY_CARE_PROVIDER_SITE_OTHER): Payer: BC Managed Care – PPO | Admitting: Family Medicine

## 2012-01-15 VITALS — BP 140/90 | Temp 98.9°F | Wt 254.0 lb

## 2012-01-15 DIAGNOSIS — R059 Cough, unspecified: Secondary | ICD-10-CM

## 2012-01-15 DIAGNOSIS — R05 Cough: Secondary | ICD-10-CM

## 2012-01-15 DIAGNOSIS — Z23 Encounter for immunization: Secondary | ICD-10-CM

## 2012-01-15 MED ORDER — HYDROCODONE-HOMATROPINE 5-1.5 MG/5ML PO SYRP: 5.0000 mL | ORAL_SOLUTION | Freq: Four times a day (QID) | ORAL | Status: DC | PRN

## 2012-01-15 NOTE — Progress Notes (Signed)
  Subjective:    Patient ID: Jason Lopez, male    DOB: 1960/03/22, 51 y.o.   MRN: [id number]  HPI  Patient seen with acute issue of upper respiratory congestion. Started yesterday. Bilateral ear pain. Cough nonproductive. No significant nasal congestion. No fever. No chills. No headache. Denies sore throat. Similar symptoms every October. Using Allegra and Mucinex without much. Cough is especially bothersome last night. Requesting flu vaccine. Nonsmoker. No chronic lung disease.   Review of Systems  Constitutional: Negative for fever and chills.  HENT: Positive for ear pain. Negative for sore throat and sinus pressure.   Respiratory: Positive for cough. Negative for shortness of breath and wheezing.   Cardiovascular: Negative for chest pain.       Objective:   Physical Exam  Constitutional: He appears well-developed and well-nourished.  HENT:  Right Ear: External ear normal.  Left Ear: External ear normal.  Mouth/Throat: Oropharynx is clear and moist.  Neck: Neck supple. No thyromegaly present.  Cardiovascular: Normal rate and regular rhythm.   Pulmonary/Chest: Effort normal and breath sounds normal. No respiratory distress. He has no wheezes. He has no rales.  Lymphadenopathy:    He has no cervical adenopathy.          Assessment & Plan:  Cough. Differentials allergic versus acute viral. Continue Allegra. Hycodan cough syrup 1 teaspoon each bedtime as needed. Flu vaccine given. Followup promptly for fever or change of symptoms

## 2012-01-15 NOTE — Telephone Encounter (Signed)
Caller: Tequan/Patient; Patient Name: Jason Lopez; PCP: Darryll Capers (Adults only); Best Callback Phone Number: [phone] Calling regarding possible sinus infection/bronchitis. Has chest congestion, cough, ears hurt, onset of symptoms 01/14/12, afebrile. Has had some wheezing, not now. Taking Mucinex and no relief. Wants an antibiotic called in. Informed that per office orders no antibiotics are called in, needs appt. Emergent signs and symptoms ruled out as per Cough protocol except for see in 24 hours due to cough worse when lying down and relieved after being in a sitting position. Appt scheduled for 01/15/12 at 4 pm Dr. Caryl Never. Offered earlier appt states it has to be at 4 or after. Call back parameters given.

## 2012-01-26 ENCOUNTER — Ambulatory Visit (INDEPENDENT_AMBULATORY_CARE_PROVIDER_SITE_OTHER): Payer: BC Managed Care – PPO | Admitting: Internal Medicine

## 2012-01-26 VITALS — BP 140/80 | HR 72 | Temp 98.2°F | Resp 16 | Ht 74.0 in | Wt 252.0 lb

## 2012-01-26 DIAGNOSIS — J069 Acute upper respiratory infection, unspecified: Secondary | ICD-10-CM

## 2012-01-26 DIAGNOSIS — K219 Gastro-esophageal reflux disease without esophagitis: Secondary | ICD-10-CM

## 2012-01-26 DIAGNOSIS — I10 Essential (primary) hypertension: Secondary | ICD-10-CM

## 2012-01-26 NOTE — Progress Notes (Signed)
Subjective:    Patient ID: [name], male    DOB: [birth year], 51 y.o.   MRN: [id number]  HPI  Follow up GERD HTN stable Hyperlipidemia Increased cough with signs and symptoms of URI    Review of Systems  Constitutional: Negative for fever and fatigue.  HENT: Negative for hearing loss, congestion, neck pain and postnasal drip.   Eyes: Negative for discharge, redness and visual disturbance.  Respiratory: Negative for cough, shortness of breath and wheezing.   Cardiovascular: Negative for leg swelling.  Gastrointestinal: Negative for abdominal pain, constipation and abdominal distention.  Genitourinary: Negative for urgency and frequency.  Musculoskeletal: Negative for joint swelling and arthralgias.  Skin: Negative for color change and rash.  Neurological: Negative for weakness and light-headedness.  Hematological: Negative for adenopathy.  Psychiatric/Behavioral: Negative for behavioral problems.   Past Medical History  Diagnosis Date  . HYPOGONADISM 01/08/2010  . HYPERLIPIDEMIA 12/09/2006  . ERECTILE DYSFUNCTION, MILD 08/02/2007  . HYPERTENSION 12/09/2006  . GERD 12/09/2006  . Anal and rectal polyp 08/27/2009  . PLANTAR FASCIITIS 01/21/2010  . RADIAL NERVE INJURY 01/21/2010  . COLONIC POLYPS, HX OF 12/09/2006    History   Social History  . Marital Status: Single    Spouse Name: N/A    Number of Children: N/A  . Years of Education: N/A   Occupational History  . OWNER   . Scientist, research (medical)    Social History Main Topics  . Smoking status: Never Smoker   . Smokeless tobacco: Not on file  . Alcohol Use: Yes  . Drug Use: No  . Sexually Active: Not on file   Other Topics Concern  . Not on file   Social History Narrative  . No narrative on file    Past Surgical History  Procedure Date  . Colonoscopy 2008    Family History  Problem Relation Age of Onset  . Breast cancer    . Cancer    . Diabetes    . Hypertension      No Known Allergies  Current  Outpatient Prescriptions on File Prior to Visit  Medication Sig Dispense Refill  . ELIDEL 1 % cream USE AS DIRECTED  30 g  0  . ezetimibe-simvastatin (VYTORIN) 10-40 MG per tablet Take 1 tablet by mouth. 1 tab 3 times a week      . fexofenadine-pseudoephedrine (ALLEGRA-D) 60-120 MG per tablet Take 1 tablet by mouth 2 (two) times daily.        . fluticasone (FLONASE) 50 MCG/ACT nasal spray Place 2 sprays into the nose daily.  16 g  12  . meloxicam (MOBIC) 7.5 MG tablet Take 7.5 mg by mouth daily.      . moexipril-hydrochlorothiazide (UNIRETIC) 15-25 MG per tablet TAKE 1/2 TABLET BY MOUTH EVERY DAY  30 tablet  5  . Multiple Vitamin (MULTIVITAMIN) tablet Take 1 tablet by mouth daily.        Marland Kitchen NEXIUM 40 MG capsule TAKE 1 CAPSULE BY MOUTH EVERY DAY  30 capsule  1  . Testosterone (ANDROGEL) 20.25 MG/1.25GM (1.62%) GEL Place 2 application onto the skin daily.  1.25 g  5  . [DISCONTINUED] clonazePAM (KLONOPIN) 0.5 MG tablet Take 1 tablet (0.5 mg total) by mouth 1 day or 1 dose.  30 tablet  0    BP 140/80  Pulse 72  Temp 98.2 F (36.8 C)  Resp 16  Ht 6\' 2"  (1.88 m)  Wt 252 lb (114.306 kg)  BMI 32.35 kg/m2  Objective:   Physical Exam  Constitutional: He appears well-developed and well-nourished.  HENT:  Head: Normocephalic and atraumatic.  Eyes: Conjunctivae normal are normal. Pupils are equal, round, and reactive to light.  Neck: Normal range of motion. Neck supple.  Cardiovascular: Normal rate and regular rhythm.   Pulmonary/Chest: Effort normal and breath sounds normal.  Abdominal: Soft. Bowel sounds are normal.          Assessment & Plan:  Zpack for URI Continue PPI for gerd with reflux Stable HTN Weight loss and diet reviewed

## 2012-02-18 ENCOUNTER — Telehealth: Payer: Self-pay | Admitting: Internal Medicine

## 2012-02-18 NOTE — Telephone Encounter (Signed)
Patient Information:  Caller Name: Zymarion  Phone: [phone]  Patient: Jason Lopez, Jason Lopez  Gender: Male  DOB: Jun 09, 1960  Age: 51 Years  PCP: Darryll Capers (Adults only)   Symptoms  Reason For Call & Symptoms: Was seen 10/31 for cold and cough, continued to have the cough and now coughing up chunks of yellow/green/brown mucous  Reviewed Health History In EMR: Yes  Reviewed Medications In EMR: Yes  Reviewed Allergies In EMR: Yes  Reviewed Surgeries / Procedures: Yes  Date of Onset of Symptoms: 01/15/2012  Treatments Tried: Delsym and Hydrocodone Cough Syrup  Treatments Tried Worked: No  Guideline(s) Used:  Cough  Disposition Per Guideline:   See Today in Office  Reason For Disposition Reached:   Severe coughing spells (e.g., whooping sound after coughing, vomiting after coughing)  Advice Given:  N/A  Office Follow Up:  Does the office need to follow up with this patient?: No  Instructions For The Office: N/A  Patient Refused Recommendation:  Patient Will Go To [facility]  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   He cannot come in during office hours.  Will go to an [facility] early this evening.

## 2012-03-29 ENCOUNTER — Encounter: Payer: Self-pay | Admitting: Internal Medicine

## 2012-04-08 ENCOUNTER — Telehealth: Payer: Self-pay | Admitting: Internal Medicine

## 2012-04-08 NOTE — Telephone Encounter (Signed)
Jason Lopez called. States he tried to get his testosterone filled at [facility]. They told him it had to be authorized by Dr. Lovell Sheehan. Pharm states they've called our office 3x w/no response? I don't see any telephone notes in his chart. Please advise. Pt is requesting you call [facility] and authorize the rx.

## 2012-04-08 NOTE — Telephone Encounter (Signed)
Has 1 refill remaining [facility] ,but gave authorization for 5- ,but told them to wait umntil pt calls them adn tells him he is ready for it. Left message on machine For pt

## 2012-05-26 ENCOUNTER — Ambulatory Visit: Payer: BC Managed Care – PPO | Admitting: Internal Medicine

## 2012-05-28 ENCOUNTER — Other Ambulatory Visit: Payer: Self-pay | Admitting: Internal Medicine

## 2012-06-04 ENCOUNTER — Other Ambulatory Visit: Payer: Self-pay | Admitting: Internal Medicine

## 2012-06-29 ENCOUNTER — Other Ambulatory Visit: Payer: Self-pay | Admitting: *Deleted

## 2012-06-29 DIAGNOSIS — J3089 Other allergic rhinitis: Secondary | ICD-10-CM

## 2012-06-29 MED ORDER — FLUTICASONE PROPIONATE 50 MCG/ACT NA SUSP: 2.0000 | Freq: Every day | NASAL | Status: DC

## 2012-07-22 ENCOUNTER — Other Ambulatory Visit: Payer: Self-pay | Admitting: Internal Medicine

## 2012-08-02 ENCOUNTER — Ambulatory Visit (INDEPENDENT_AMBULATORY_CARE_PROVIDER_SITE_OTHER): Payer: BC Managed Care – PPO | Admitting: Internal Medicine

## 2012-08-02 ENCOUNTER — Encounter: Payer: Self-pay | Admitting: Internal Medicine

## 2012-08-02 VITALS — BP 146/80 | HR 80 | Temp 98.3°F | Resp 16 | Ht 74.0 in | Wt 254.0 lb

## 2012-08-02 DIAGNOSIS — T887XXA Unspecified adverse effect of drug or medicament, initial encounter: Secondary | ICD-10-CM

## 2012-08-02 DIAGNOSIS — E785 Hyperlipidemia, unspecified: Secondary | ICD-10-CM

## 2012-08-02 DIAGNOSIS — E291 Testicular hypofunction: Secondary | ICD-10-CM

## 2012-08-02 LAB — HEPATIC FUNCTION PANEL
ALT: 25 U/L (ref 0–53)
AST: 22 U/L (ref 0–37)
Albumin: 3.9 g/dL (ref 3.5–5.2)
Total Protein: 7.4 g/dL (ref 6.0–8.3)

## 2012-08-02 LAB — LIPID PANEL
Cholesterol: 236 mg/dL — ABNORMAL HIGH (ref 0–200)
Total CHOL/HDL Ratio: 6
Triglycerides: 413 mg/dL — ABNORMAL HIGH (ref 0.0–149.0)

## 2012-08-02 LAB — TESTOSTERONE: Testosterone: 190.59 ng/dL — ABNORMAL LOW (ref 350.00–890.00)

## 2012-08-02 NOTE — Progress Notes (Signed)
Subjective:    Patient ID: Jason Lopez, male    DOB: Jul 29, 1960, 52 y.o.   MRN: [id number]  HPI  Fall and fractured elbow HTN stable Moderate pain Cannot work with the cast seeing Dr Paulino Rily  At [facility] orthopedist  Review of Systems  Constitutional: Negative for fever and fatigue.  HENT: Negative for hearing loss, congestion, neck pain and postnasal drip.   Eyes: Negative for discharge, redness and visual disturbance.  Respiratory: Negative for cough, shortness of breath and wheezing.   Cardiovascular: Negative for leg swelling.  Gastrointestinal: Negative for abdominal pain, constipation and abdominal distention.  Genitourinary: Negative for urgency and frequency.  Musculoskeletal: Positive for myalgias and joint swelling. Negative for arthralgias.  Skin: Negative for color change and rash.  Neurological: Negative for weakness and light-headedness.  Hematological: Negative for adenopathy.  Psychiatric/Behavioral: Negative for behavioral problems.   Past Medical History  Diagnosis Date  . HYPOGONADISM 01/08/2010  . HYPERLIPIDEMIA 12/09/2006  . ERECTILE DYSFUNCTION, MILD 08/02/2007  . HYPERTENSION 12/09/2006  . GERD 12/09/2006  . Anal and rectal polyp 08/27/2009  . PLANTAR FASCIITIS 01/21/2010  . RADIAL NERVE INJURY 01/21/2010  . COLONIC POLYPS, HX OF 12/09/2006    History   Social History  . Marital Status: Single    Spouse Name: N/A    Number of Children: N/A  . Years of Education: N/A   Occupational History  . OWNER   . Scientist, research (medical)    Social History Main Topics  . Smoking status: Never Smoker   . Smokeless tobacco: Not on file  . Alcohol Use: Yes  . Drug Use: No  . Sexually Active: Not on file   Other Topics Concern  . Not on file   Social History Narrative  . No narrative on file    Past Surgical History  Procedure Laterality Date  . Colonoscopy  2008    Family History  Problem Relation Age of Onset  . Breast cancer    . Cancer    .  Diabetes    . Hypertension      No Known Allergies  Current Outpatient Prescriptions on File Prior to Visit  Medication Sig Dispense Refill  . clonazePAM (KLONOPIN) 0.5 MG tablet Take 0.5 mg by mouth 1 day or 1 dose.      Marland Kitchen ELIDEL 1 % cream USE AS DIRECTED  30 g  0  . ezetimibe-simvastatin (VYTORIN) 10-40 MG per tablet Take 1 tablet by mouth. 1 tab 3 times a week      . fexofenadine-pseudoephedrine (ALLEGRA-D) 60-120 MG per tablet Take 1 tablet by mouth 2 (two) times daily.        . fluticasone (FLONASE) 50 MCG/ACT nasal spray Place 2 sprays into the nose daily.  16 g  12  . fluticasone (FLONASE) 50 MCG/ACT nasal spray Place 2 sprays into the nose daily.  16 g  12  . HYDROcodone-homatropine (HYCODAN) 5-1.5 MG/5ML syrup Take 5 mLs by mouth every 6 (six) hours as needed.      . meloxicam (MOBIC) 7.5 MG tablet Take 7.5 mg by mouth daily.      . moexipril-hydrochlorothiazide (UNIRETIC) 15-25 MG per tablet TAKE 1/2 TABLET BY MOUTH EVERY DAY  30 tablet  1  . Multiple Vitamin (MULTIVITAMIN) tablet Take 1 tablet by mouth daily.        Marland Kitchen NEXIUM 40 MG capsule TAKE ONE CAPSULE BY MOUTH EVERY DAY  30 capsule  1  . Testosterone (ANDROGEL) 20.25 MG/1.25GM (1.62%) GEL Place  2 application onto the skin daily.  1.25 g  5   No current facility-administered medications on file prior to visit.    BP 146/80  Pulse 80  Temp(Src) 98.3 F (36.8 C)  Resp 16  Ht 6\' 2"  (1.88 m)  Wt 254 lb (115.214 kg)  BMI 32.6 kg/m2       Objective:   Physical Exam  Nursing note and vitals reviewed. Constitutional: He appears well-developed and well-nourished.  HENT:  Head: Normocephalic and atraumatic.  Eyes: Conjunctivae are normal. Pupils are equal, round, and reactive to light.  Neck: Normal range of motion. Neck supple.  Cardiovascular: Normal rate and regular rhythm.   Pulmonary/Chest: Effort normal and breath sounds normal.  Abdominal: Soft. Bowel sounds are normal.  Musculoskeletal:  Fractured left  elbow          Assessment & Plan:  Cast from fracture to elbow from fall has resulted in him being short term disabled for his own occupation I suspect that he will not return to work full-time until he gets his cast removed and complete a course of physical therapy.  Elevated blood pressure after recheck 140/80 Stable GERD  Allergies Lipid stable

## 2012-08-03 LAB — LDL CHOLESTEROL, DIRECT: Direct LDL: 141.8 mg/dL

## 2012-08-27 ENCOUNTER — Other Ambulatory Visit: Payer: Self-pay | Admitting: *Deleted

## 2012-08-27 MED ORDER — PIMECROLIMUS 1 % EX CREA: TOPICAL_CREAM | CUTANEOUS | Status: DC

## 2012-09-06 ENCOUNTER — Ambulatory Visit: Payer: BC Managed Care – PPO | Admitting: Internal Medicine

## 2012-09-22 ENCOUNTER — Other Ambulatory Visit: Payer: Self-pay | Admitting: Internal Medicine

## 2012-09-29 ENCOUNTER — Other Ambulatory Visit: Payer: Self-pay | Admitting: Internal Medicine

## 2012-10-29 ENCOUNTER — Other Ambulatory Visit: Payer: Self-pay | Admitting: *Deleted

## 2012-10-29 DIAGNOSIS — E349 Endocrine disorder, unspecified: Secondary | ICD-10-CM

## 2012-10-29 MED ORDER — TESTOSTERONE 20.25 MG/1.25GM (1.62%) TD GEL: 2.0000 "application " | Freq: Every day | TRANSDERMAL | Status: DC

## 2012-11-21 ENCOUNTER — Other Ambulatory Visit: Payer: Self-pay | Admitting: Internal Medicine

## 2012-12-06 ENCOUNTER — Ambulatory Visit (INDEPENDENT_AMBULATORY_CARE_PROVIDER_SITE_OTHER): Payer: BC Managed Care – PPO | Admitting: Internal Medicine

## 2012-12-06 ENCOUNTER — Encounter: Payer: Self-pay | Admitting: Internal Medicine

## 2012-12-06 VITALS — BP 140/80 | HR 72 | Temp 98.2°F | Resp 16 | Ht 74.0 in | Wt 254.0 lb

## 2012-12-06 DIAGNOSIS — E291 Testicular hypofunction: Secondary | ICD-10-CM

## 2012-12-06 DIAGNOSIS — R635 Abnormal weight gain: Secondary | ICD-10-CM

## 2012-12-06 DIAGNOSIS — Z23 Encounter for immunization: Secondary | ICD-10-CM

## 2012-12-06 DIAGNOSIS — K219 Gastro-esophageal reflux disease without esophagitis: Secondary | ICD-10-CM

## 2012-12-06 MED ORDER — PHENTERMINE HCL 37.5 MG PO CAPS: 37.5000 mg | ORAL_CAPSULE | ORAL | Status: DC

## 2012-12-06 MED ORDER — TESTOSTERONE 12.5 MG/ACT (1%) TD GEL: 2.0000 "application " | Freq: Every day | TRANSDERMAL | Status: DC

## 2012-12-06 MED ORDER — PANTOPRAZOLE SODIUM 40 MG PO TBEC: 40.0000 mg | DELAYED_RELEASE_TABLET | Freq: Every day | ORAL | Status: DC

## 2012-12-06 NOTE — Progress Notes (Signed)
Subjective:    Patient ID: Jason Lopez, male    DOB: 10/17/1960, 52 y.o.   MRN: [id number]  HPI Patient is a 52 year old male followed for hypertension hyperlipidemia and for GERD.  He is insurance plan no longer covers Nexium we'll change to generic Protonix as a trial and see if this is able to control his symptomology.  Patient is concerned about progressive weight gain and we discussed the use of adepex  Patient is hypogonadal   Review of Systems  Constitutional: Negative for fever and fatigue.  HENT: Negative for hearing loss, congestion, neck pain and postnasal drip.   Eyes: Negative for discharge, redness and visual disturbance.  Respiratory: Negative for cough, shortness of breath and wheezing.   Cardiovascular: Negative for leg swelling.  Gastrointestinal: Negative for abdominal pain, constipation and abdominal distention.  Genitourinary: Negative for urgency and frequency.  Musculoskeletal: Negative for joint swelling and arthralgias.  Skin: Negative for color change and rash.  Neurological: Negative for weakness and light-headedness.  Hematological: Negative for adenopathy.  Psychiatric/Behavioral: Negative for behavioral problems.   Past Medical History  Diagnosis Date  . HYPOGONADISM 01/08/2010  . HYPERLIPIDEMIA 12/09/2006  . ERECTILE DYSFUNCTION, MILD 08/02/2007  . HYPERTENSION 12/09/2006  . GERD 12/09/2006  . Anal and rectal polyp 08/27/2009  . PLANTAR FASCIITIS 01/21/2010  . RADIAL NERVE INJURY 01/21/2010  . COLONIC POLYPS, HX OF 12/09/2006    History   Social History  . Marital Status: Single    Spouse Name: N/A    Number of Children: N/A  . Years of Education: N/A   Occupational History  . OWNER   . Scientist, research (medical)    Social History Main Topics  . Smoking status: Never Smoker   . Smokeless tobacco: Not on file  . Alcohol Use: Yes  . Drug Use: No  . Sexual Activity: Not on file   Other Topics Concern  . Not on file   Social History Narrative   . No narrative on file    Past Surgical History  Procedure Laterality Date  . Colonoscopy  2008    Family History  Problem Relation Age of Onset  . Breast cancer    . Cancer    . Diabetes    . Hypertension      No Known Allergies  Current Outpatient Prescriptions on File Prior to Visit  Medication Sig Dispense Refill  . ezetimibe-simvastatin (VYTORIN) 10-40 MG per tablet Take 1 tablet by mouth. 1 tab 3 times a week      . fluticasone (FLONASE) 50 MCG/ACT nasal spray Place 2 sprays into the nose daily.  16 g  12  . fluticasone (FLONASE) 50 MCG/ACT nasal spray Place 2 sprays into the nose daily.  16 g  12  . meloxicam (MOBIC) 7.5 MG tablet Take 7.5 mg by mouth daily.      . moexipril-hydrochlorothiazide (UNIRETIC) 15-25 MG per tablet TAKE 1/2 TABLET BY MOUTH EVERY DAY  30 tablet  6  . Multiple Vitamin (MULTIVITAMIN) tablet Take 1 tablet by mouth daily.        . pimecrolimus (ELIDEL) 1 % cream USE AS DIRECTED  30 g  6   No current facility-administered medications on file prior to visit.    BP 140/80  Pulse 72  Temp(Src) 98.2 F (36.8 C)  Resp 16  Ht 6\' 2"  (1.88 m)  Wt 254 lb (115.214 kg)  BMI 32.6 kg/m2       Objective:   Physical Exam  Constitutional: He appears  well-developed and well-nourished.  HENT:  Head: Normocephalic and atraumatic.  Eyes: Conjunctivae are normal. Pupils are equal, round, and reactive to light.  Neck: Normal range of motion. Neck supple.  Cardiovascular: Normal rate and regular rhythm.   Pulmonary/Chest: Effort normal and breath sounds normal.  Abdominal: Soft. Bowel sounds are normal.          Assessment & Plan:  GERD symtomatic  off PPI Refill protonix  Weight gain with trial of adepex  HR on androgel change to Vogelox and monitor

## 2013-01-04 ENCOUNTER — Encounter: Payer: Self-pay | Admitting: Internal Medicine

## 2013-05-30 ENCOUNTER — Telehealth: Payer: Self-pay | Admitting: Internal Medicine

## 2013-05-30 NOTE — Telephone Encounter (Signed)
error 

## 2013-05-31 ENCOUNTER — Telehealth: Payer: Self-pay | Admitting: Internal Medicine

## 2013-05-31 DIAGNOSIS — E291 Testicular hypofunction: Secondary | ICD-10-CM

## 2013-05-31 NOTE — Telephone Encounter (Signed)
Pt needs refill on testosterone compound sent to [facility] [address]

## 2013-06-01 MED ORDER — TESTOSTERONE 12.5 MG/ACT (1%) TD GEL: 2.0000 "application " | Freq: Every day | TRANSDERMAL | Status: DC

## 2013-06-01 NOTE — Telephone Encounter (Signed)
Pt  Requesting testosterone compound the wrong rx was sent to [facility]

## 2013-06-01 NOTE — Telephone Encounter (Signed)
[facility]

## 2013-06-03 NOTE — Telephone Encounter (Signed)
[facility]

## 2013-06-06 ENCOUNTER — Other Ambulatory Visit: Payer: BC Managed Care – PPO

## 2013-06-07 ENCOUNTER — Other Ambulatory Visit (INDEPENDENT_AMBULATORY_CARE_PROVIDER_SITE_OTHER): Payer: BC Managed Care – PPO

## 2013-06-07 DIAGNOSIS — Z0000 Encounter for general adult medical examination without abnormal findings: Secondary | ICD-10-CM

## 2013-06-07 LAB — POCT URINALYSIS DIPSTICK
Bilirubin, UA: NEGATIVE
Blood, UA: NEGATIVE
Glucose, UA: NEGATIVE
Ketones, UA: NEGATIVE
Leukocytes, UA: NEGATIVE
Nitrite, UA: NEGATIVE
PH UA: 6
SPEC GRAV UA: 1.025
UROBILINOGEN UA: 0.2

## 2013-06-07 LAB — CBC WITH DIFFERENTIAL/PLATELET
BASOS ABS: 0 10*3/uL (ref 0.0–0.1)
Basophils Relative: 0.4 % (ref 0.0–3.0)
EOS PCT: 1.1 % (ref 0.0–5.0)
Eosinophils Absolute: 0.1 10*3/uL (ref 0.0–0.7)
HCT: 43.3 % (ref 39.0–52.0)
Hemoglobin: 14.4 g/dL (ref 13.0–17.0)
Lymphocytes Relative: 26 % (ref 12.0–46.0)
Lymphs Abs: 1.8 10*3/uL (ref 0.7–4.0)
MCHC: 33.3 g/dL (ref 30.0–36.0)
MCV: 82.9 fl (ref 78.0–100.0)
MONO ABS: 0.5 10*3/uL (ref 0.1–1.0)
MONOS PCT: 7.2 % (ref 3.0–12.0)
NEUTROS PCT: 65.3 % (ref 43.0–77.0)
Neutro Abs: 4.6 10*3/uL (ref 1.4–7.7)
PLATELETS: 218 10*3/uL (ref 150.0–400.0)
RBC: 5.22 Mil/uL (ref 4.22–5.81)
RDW: 13.6 % (ref 11.5–14.6)
WBC: 7 10*3/uL (ref 4.5–10.5)

## 2013-06-07 LAB — BASIC METABOLIC PANEL
BUN: 13 mg/dL (ref 6–23)
CO2: 31 mEq/L (ref 19–32)
CREATININE: 0.9 mg/dL (ref 0.4–1.5)
Calcium: 9.6 mg/dL (ref 8.4–10.5)
Chloride: 102 mEq/L (ref 96–112)
GFR: 93.33 mL/min (ref >60.00)
GLUCOSE: 93 mg/dL (ref 70–99)
Potassium: 4.2 mEq/L (ref 3.5–5.1)
Sodium: 140 mEq/L (ref 135–145)

## 2013-06-07 LAB — HEPATIC FUNCTION PANEL
ALT: 31 U/L (ref 0–53)
AST: 24 U/L (ref 0–37)
Albumin: 4 g/dL (ref 3.5–5.2)
Alkaline Phosphatase: 71 U/L (ref 39–117)
BILIRUBIN DIRECT: 0.1 mg/dL (ref 0.0–0.3)
BILIRUBIN TOTAL: 0.7 mg/dL (ref 0.3–1.2)
Total Protein: 6.6 g/dL (ref 6.0–8.3)

## 2013-06-07 LAB — LIPID PANEL
Cholesterol: 186 mg/dL (ref 0–200)
HDL: 51.8 mg/dL (ref >39.00)
LDL CALC: 119 mg/dL — AB (ref 0–99)
Total CHOL/HDL Ratio: 4
Triglycerides: 77 mg/dL (ref 0.0–149.0)
VLDL: 15.4 mg/dL (ref 0.0–40.0)

## 2013-06-07 LAB — PSA: PSA: 0.69 ng/mL (ref 0.10–4.00)

## 2013-06-07 LAB — TSH: TSH: 1.71 u[IU]/mL (ref 0.35–5.50)

## 2013-06-13 ENCOUNTER — Encounter: Payer: Self-pay | Admitting: Internal Medicine

## 2013-06-13 ENCOUNTER — Ambulatory Visit (INDEPENDENT_AMBULATORY_CARE_PROVIDER_SITE_OTHER): Payer: BC Managed Care – PPO | Admitting: Internal Medicine

## 2013-06-13 VITALS — BP 140/78 | HR 78 | Temp 98.9°F | Ht 74.0 in | Wt 244.5 lb

## 2013-06-13 DIAGNOSIS — Z0000 Encounter for general adult medical examination without abnormal findings: Secondary | ICD-10-CM

## 2013-06-13 DIAGNOSIS — R635 Abnormal weight gain: Secondary | ICD-10-CM

## 2013-06-13 MED ORDER — PHENTERMINE HCL 37.5 MG PO CAPS: 37.5000 mg | ORAL_CAPSULE | ORAL | Status: DC

## 2013-06-13 NOTE — Progress Notes (Signed)
Subjective:    Patient ID: Jason Lopez, male    DOB: 03-Jan-1961, 53 y.o.   MRN: [id number]  HPI  CPX  Review of Systems  Constitutional: Negative for fever and fatigue.  HENT: Negative for congestion, hearing loss and postnasal drip.   Eyes: Negative for discharge, redness and visual disturbance.  Respiratory: Negative for cough, shortness of breath and wheezing.   Cardiovascular: Negative for leg swelling.  Gastrointestinal: Negative for abdominal pain, constipation and abdominal distention.  Genitourinary: Negative for urgency and frequency.  Musculoskeletal: Negative for arthralgias, joint swelling and neck pain.  Skin: Negative for color change and rash.  Neurological: Negative for weakness and light-headedness.  Hematological: Negative for adenopathy.  Psychiatric/Behavioral: Negative for behavioral problems.   Past Medical History  Diagnosis Date  . HYPOGONADISM 01/08/2010  . HYPERLIPIDEMIA 12/09/2006  . ERECTILE DYSFUNCTION, MILD 08/02/2007  . HYPERTENSION 12/09/2006  . GERD 12/09/2006  . Anal and rectal polyp 08/27/2009  . PLANTAR FASCIITIS 01/21/2010  . RADIAL NERVE INJURY 01/21/2010  . COLONIC POLYPS, HX OF 12/09/2006    History   Social History  . Marital Status: Single    Spouse Name: N/A    Number of Children: N/A  . Years of Education: N/A   Occupational History  . OWNER   . Scientist, research (medical)    Social History Main Topics  . Smoking status: Never Smoker   . Smokeless tobacco: Not on file  . Alcohol Use: Yes  . Drug Use: No  . Sexual Activity: Not on file   Other Topics Concern  . Not on file   Social History Narrative  . No narrative on file    Past Surgical History  Procedure Laterality Date  . Colonoscopy  2008    Family History  Problem Relation Age of Onset  . Breast cancer    . Cancer    . Diabetes    . Hypertension      No Known Allergies  Current Outpatient Prescriptions on File Prior to Visit  Medication Sig Dispense Refill    . ezetimibe-simvastatin (VYTORIN) 10-40 MG per tablet Take 1 tablet by mouth. 1 tab 3 times a week      . fluticasone (FLONASE) 50 MCG/ACT nasal spray Place 2 sprays into the nose daily.  16 g  12  . meloxicam (MOBIC) 7.5 MG tablet Take 7.5 mg by mouth daily.      . moexipril-hydrochlorothiazide (UNIRETIC) 15-25 MG per tablet TAKE 1/2 TABLET BY MOUTH EVERY DAY  30 tablet  6  . Multiple Vitamin (MULTIVITAMIN) tablet Take 1 tablet by mouth daily.        . pantoprazole (PROTONIX) 40 MG tablet Take 1 tablet (40 mg total) by mouth daily.  30 tablet  3  . phentermine 37.5 MG capsule Take 1 capsule (37.5 mg total) by mouth every morning.  30 capsule  3  . pimecrolimus (ELIDEL) 1 % cream USE AS DIRECTED  30 g  6  . Testosterone (VOGELXO PUMP) 12.5 MG/ACT (1%) GEL Place 2 application onto the skin daily.  75 g  3   No current facility-administered medications on file prior to visit.    BP 140/78  Pulse 78  Temp(Src) 98.9 F (37.2 C) (Oral)  Ht 6\' 2"  (1.88 m)  Wt 244 lb 8 oz (110.904 kg)  BMI 31.38 kg/m2  SpO2 97%       Objective:   Physical Exam  Constitutional: He is oriented to person, place, and time. He appears well-developed  and well-nourished.  HENT:  Head: Normocephalic and atraumatic.  Eyes: Conjunctivae are normal. Pupils are equal, round, and reactive to light.  Neck: Normal range of motion. Neck supple.  Cardiovascular: Normal rate and regular rhythm.   Pulmonary/Chest: Effort normal and breath sounds normal.  Abdominal: Soft. Bowel sounds are normal.  Musculoskeletal: Normal range of motion.  Neurological: He is alert and oriented to person, place, and time.  Skin: Skin is warm and dry.  Psychiatric: He has a normal mood and affect. His behavior is normal.          Assessment & Plan:  Patient presents for yearly preventative medicine examination. Medicare questionnaire was completed  All immunizations and health maintenance protocols were reviewed with the  patient and needed orders were placed.  Appropriate screening laboratory values were ordered for the patient including screening of hyperlipidemia, renal function and hepatic function. If indicated by BPH, a PSA was ordered.  Medication reconciliation,  past medical history, social history, problem list and allergies were reviewed in detail with the patient  Goals were established with regard to weight loss, exercise, and  diet in compliance with medications  End of life planning was discussed.

## 2013-06-13 NOTE — Progress Notes (Signed)
Pre visit review using our clinic review tool, if applicable. No additional management support is needed unless otherwise documented below in the visit note. 

## 2013-06-13 NOTE — Patient Instructions (Signed)
The patient is instructed to continue all medications as prescribed. Schedule followup with check out clerk upon leaving the clinic  

## 2013-08-17 ENCOUNTER — Other Ambulatory Visit: Payer: Self-pay | Admitting: Internal Medicine

## 2013-09-22 ENCOUNTER — Other Ambulatory Visit: Payer: Self-pay | Admitting: Internal Medicine

## 2013-10-21 ENCOUNTER — Other Ambulatory Visit: Payer: Self-pay | Admitting: Internal Medicine

## 2013-11-10 ENCOUNTER — Other Ambulatory Visit: Payer: Self-pay

## 2013-11-10 MED ORDER — LISINOPRIL-HYDROCHLOROTHIAZIDE 20-25 MG PO TABS: 1.0000 | ORAL_TABLET | Freq: Every day | ORAL | Status: DC

## 2013-12-09 ENCOUNTER — Telehealth: Payer: Self-pay

## 2013-12-09 NOTE — Telephone Encounter (Signed)
yes

## 2013-12-09 NOTE — Telephone Encounter (Signed)
[facility] requesting refill on Testosterone Lipoderm (10 ML) 5% cream is this ok to refill?

## 2013-12-12 NOTE — Telephone Encounter (Signed)
Re-fill request was received on the below medication. [facility]

## 2013-12-15 ENCOUNTER — Other Ambulatory Visit: Payer: Self-pay | Admitting: Internal Medicine

## 2014-03-14 ENCOUNTER — Other Ambulatory Visit: Payer: Self-pay | Admitting: *Deleted

## 2014-03-14 MED ORDER — PANTOPRAZOLE SODIUM 40 MG PO TBEC: DELAYED_RELEASE_TABLET | ORAL | Status: DC

## 2014-04-10 ENCOUNTER — Other Ambulatory Visit: Payer: Self-pay | Admitting: *Deleted

## 2014-04-10 MED ORDER — FLUTICASONE PROPIONATE 50 MCG/ACT NA SUSP: NASAL | Status: DC

## 2014-04-17 ENCOUNTER — Ambulatory Visit (INDEPENDENT_AMBULATORY_CARE_PROVIDER_SITE_OTHER): Payer: No Typology Code available for payment source | Admitting: Family Medicine

## 2014-04-17 ENCOUNTER — Encounter: Payer: Self-pay | Admitting: Family Medicine

## 2014-04-17 VITALS — BP 102/64 | Temp 98.5°F | Wt 236.0 lb

## 2014-04-17 DIAGNOSIS — L219 Seborrheic dermatitis, unspecified: Secondary | ICD-10-CM | POA: Insufficient documentation

## 2014-04-17 DIAGNOSIS — I10 Essential (primary) hypertension: Secondary | ICD-10-CM

## 2014-04-17 DIAGNOSIS — E291 Testicular hypofunction: Secondary | ICD-10-CM

## 2014-04-17 DIAGNOSIS — Z23 Encounter for immunization: Secondary | ICD-10-CM

## 2014-04-17 DIAGNOSIS — E669 Obesity, unspecified: Secondary | ICD-10-CM

## 2014-04-17 DIAGNOSIS — E785 Hyperlipidemia, unspecified: Secondary | ICD-10-CM

## 2014-04-17 NOTE — Patient Instructions (Addendum)
For your cholesterol, see me in 6 months for a physical with labs a week before. Do not take vytorin at all for 6 weeks before coming so we can establish your new baseline and calculate a 10 year risk.   Order colonoscopy with Dr. Loreta AveMann at next visit. Check to see if she is in network now.   Blood pressure looks great  Check on 5mg  cialis samples. Tell Ardine BjorkKeba to have me send rx if no samples.

## 2014-04-17 NOTE — Assessment & Plan Note (Signed)
controlled on lisinopril hctz 20-25mg . Much improved with weight loss, may eventually be able to titrate down

## 2014-04-17 NOTE — Assessment & Plan Note (Signed)
I had thought patient was taking phentermine for course since October rx with 5 refills. Actually has been on since 2012. Weight is down 23 lbs in this time frame but I do not believe this is the best choice for longterm weight loss (see up to date information below). Will need to discuss at follow up discontinuing and continuing lifestyle changes.

## 2014-04-17 NOTE — Addendum Note (Signed)
Addended by: Shelva MajesticHUNTER, STEPHEN O on: 04/17/2014 10:17 PM   Modules accepted: Level of Service

## 2014-04-17 NOTE — Assessment & Plan Note (Signed)
mild poor control on vytorin from 2-3x a week to every 2-3 weeks. Given inconsistent use, patient to come off 6 weeks before next labs and we will establish 10 year CV risk and need for statin or not. Hopeful with weight loss, Lipids will come down.

## 2014-04-17 NOTE — Progress Notes (Signed)
Jason ConchStephen Joel Mericle, MD Phone: [phone]  Subjective:  Patient presents today to establish care with me as their new primary care provider. Patient was formerly a patient of Dr. Lovell SheehanJenkins. Chief complaint-noted.   Hypertension-controlled on lisinopril hctz 20-25mg . BP Readings from Last 3 Encounters:  04/17/14 102/64  06/13/13 140/78  12/06/12 140/80  Home BP monitoring-no Compliant with medications-yes without side effects ROS-Denies any CP, HA, SOB, blurry vision, LE edema .  Hyperlipidemia-mild poor control on vytorin from 2-3x a week to every 2-3 weeks  Lab Results  Component Value Date   LDLCALC 119* 06/07/2013  Regular exercise: attempting with weight loss efforts ROS- no chest pain or shortness of breath. No myalgias  Obesity improving Phentermine helps curb appetite. Attempting to complete regular exercise ROS- denies palpitations or shortness of breath.   Hypogonadism controlled Presented with severe fatigue, recurred off medicine ROS- erectile dysfunction improved with cialis 5mg   The following were reviewed and entered/updated in epic: Past Medical History  Diagnosis Date  . HYPOGONADISM 01/08/2010  . HYPERLIPIDEMIA 12/09/2006  . ERECTILE DYSFUNCTION, MILD 08/02/2007  . HYPERTENSION 12/09/2006  . GERD 12/09/2006  . PLANTAR FASCIITIS 01/21/2010  . RADIAL NERVE INJURY 01/21/2010  . COLONIC POLYPS, HX OF 12/09/2006   Patient Active Problem List   Diagnosis Date Noted  . Obesity 04/17/2014    Priority: Medium  . Depression, major, recurrent, mild 04/22/2010    Priority: Medium  . Hypogonadism in male 01/08/2010    Priority: Medium  . ERECTILE DYSFUNCTION, MILD 08/02/2007    Priority: Medium  . Hyperlipidemia 12/09/2006    Priority: Medium  . Essential hypertension 12/09/2006    Priority: Medium  . History of colonic polyps 12/09/2006    Priority: Medium  . Seborrheic dermatitis 04/17/2014    Priority: Low  . PLANTAR FASCIITIS 01/21/2010    Priority: Low  .  RADIAL NERVE INJURY 01/21/2010    Priority: Low  . Allergic rhinitis 12/09/2006    Priority: Low  . GERD 12/09/2006    Priority: Low   Past Surgical History  Procedure Laterality Date  . Colonoscopy  2008, 2011    Family History  Problem Relation Age of Onset  . Breast cancer Mother   . Pancreatic cancer Maternal Grandmother   . Diabetes Father   . Hypertension Father     mother  . Lung cancer Maternal Grandfather     smoker  . Hyperlipidemia Father     Medications- reviewed and updated Current Outpatient Prescriptions  Medication Sig Dispense Refill  . ezetimibe-simvastatin (VYTORIN) 10-40 MG per tablet Take 1 tablet by mouth. 1 tab 3 times a week    . fluticasone (FLONASE) 50 MCG/ACT nasal spray PLACE 2 SPRAYS INTO THE NOSE DAILY. 16 g 0  . lisinopril-hydrochlorothiazide (PRINZIDE,ZESTORETIC) 20-25 MG per tablet Take 1 tablet by mouth daily. 90 tablet 3  . meloxicam (MOBIC) 7.5 MG tablet Take 7.5 mg by mouth daily.    . Multiple Vitamin (MULTIVITAMIN) tablet Take 1 tablet by mouth daily.      . pantoprazole (PROTONIX) 40 MG tablet TAKE 1 TABLET (40 MG TOTAL) BY MOUTH DAILY. 30 tablet 1  . phentermine 37.5 MG capsule TAKE ONE CAPSULE BY MOUTH EVERY MORNING 30 capsule 5  . pimecrolimus (ELIDEL) 1 % cream USE AS DIRECTED 30 g 6  . Testosterone (VOGELXO PUMP) 12.5 MG/ACT (1%) GEL Place 2 application onto the skin daily. 75 g 3   Allergies-reviewed and updated No Known Allergies  History   Social History  .  Marital Status: Single    Spouse Name: N/A    Number of Children: N/A  . Years of Education: N/A   Occupational History  . OWNER   . Scientist, research (medical)    Social History Main Topics  . Smoking status: Never Smoker   . Smokeless tobacco: None  . Alcohol Use: 0.0 - 3.0 oz/week    0-5 Not specified per week  . Drug Use: No  . Sexual Activity: None   Other Topics Concern  . None   Social History Narrative   Lives alone with mom living at him at times.       Work  as a Firefighter: working on house, garden, shop    ROS--See HPI   Objective: BP 102/64 mmHg  Temp(Src) 98.5 F (36.9 C)  Wt 236 lb (107.049 kg) Gen: NAD, resting comfortably HEENT: Mucous membranes are moist. Oropharynx normal. Good dentition.  CV: RRR no murmurs rubs or gallops Lungs: CTAB no crackles, wheeze, rhonchi Abdomen: soft/nontender/nondistended/normal bowel sounds.  Ext: no edema Skin: warm, dry, no rash, bald Neuro: grossly normal, moves all extremities, PERRLA with contacts in place   Assessment/Plan:  Essential hypertension controlled on lisinopril hctz 20-25mg . Much improved with weight loss, may eventually be able to titrate down   Hyperlipidemia mild poor control on vytorin from 2-3x a week to every 2-3 weeks. Given inconsistent use, patient to come off 6 weeks before next labs and we will establish 10 year CV risk and need for statin or not. Hopeful with weight loss, Lipids will come down.    Obesity I had thought patient was taking phentermine for course since October rx with 5 refills. Actually has been on since 2012. Weight is down 23 lbs in this time frame but I do not believe this is the best choice for longterm weight loss (see up to date information below). Will need to discuss at follow up discontinuing and continuing lifestyle changes.    Hypogonadism in male Discussed benefits/risks of testosterone. Fatigue severe off medicine and patient opts to continue.    Return precautions advised. Physical in 6 months, will need to discuss phentermine as below. Can print information to patient to read before he sees me.   Orders Placed This Encounter  Procedures  . Flu Vaccine QUAD 36+ mos IM    Per up to date in regards to phentermine solo therapy:  Applies to all sympathomimetic agents: Due to their side effects and potential for abuse, we suggest not prescribing sympathomimetics for weight loss. If prescribed, limit to short-term  (?12 weeks) use. Adverse effects include increase in heart rate, blood pressure, insomnia, dry mouth, constipation, nervousness. Abuse potential due to amphetamine-like effects. May counteract effect of blood pressure medications. Avoid in patients with heart disease, poorly controlled hypertension, pulmonary hypertension, or history of addiction or drug abuse. Contraindicated in patients with a history of CVD, hyperthyroidism, glaucoma, MAO inhibitor-therapy, agitated states, pregnancy, or breast feeding.

## 2014-04-17 NOTE — Assessment & Plan Note (Signed)
Discussed benefits/risks of testosterone. Fatigue severe off medicine and patient opts to continue.

## 2014-07-11 ENCOUNTER — Telehealth: Payer: Self-pay | Admitting: *Deleted

## 2014-07-11 NOTE — Telephone Encounter (Signed)
I do not recommend long term phentermine usage for weight loss. I had thought he was only on 6 months when I saw him last. Let's have him in for a visit to discuss further or he can just discontinue and see me back at planned 6 month follow up.

## 2014-07-11 NOTE — Telephone Encounter (Signed)
Refill phentermine 37.5 mg one capsule every [facility]

## 2014-07-12 NOTE — Telephone Encounter (Signed)
I do not prescribe chronic phentermine. If he wants to consider permanent weight loss options, we can set him up with a consult for bariatric surgery or he can pursue a weight loss clinic to have this medication continued.   He has been on medication for 4 years without significant enough weight loss to justify it.   Do not refill, he can come see me to discuss but I will not refill this further. On the other hand, given chronic use, would be ok with 15mg  daily for 1 month to help wean him off. You may provide this if he desires.

## 2014-07-12 NOTE — Telephone Encounter (Signed)
Pt returned call and would like to know if he can please get a refill and stay on this until he comes in to see you in Aug then you guys will discuss other options then? Pt does not want to stop taking it at this time.

## 2014-07-12 NOTE — Telephone Encounter (Signed)
Called and left message on pt vm tcb. 

## 2014-07-13 MED ORDER — PHENTERMINE HCL 15 MG PO CAPS: 15.0000 mg | ORAL_CAPSULE | ORAL | Status: DC

## 2014-07-13 NOTE — Telephone Encounter (Signed)
Pt would like to give the one month option a try and will discuss further with you at up coming OV. Medication sent in.

## 2014-07-28 ENCOUNTER — Telehealth: Payer: Self-pay | Admitting: *Deleted

## 2014-07-28 NOTE — Telephone Encounter (Signed)
Refill testoserone lipoderm 5% cream apply 1 mL topically once daily [facility]

## 2014-07-28 NOTE — Telephone Encounter (Signed)
Ok to fill 

## 2014-07-28 NOTE — Telephone Encounter (Signed)
[facility]

## 2014-08-01 ENCOUNTER — Telehealth: Payer: Self-pay | Admitting: *Deleted

## 2014-08-01 DIAGNOSIS — E291 Testicular hypofunction: Secondary | ICD-10-CM

## 2014-08-01 NOTE — Telephone Encounter (Signed)
Please call pharmacy- they sent in a paper rx and we sent this in last week. I don't think it was for the vogel XO pump though.

## 2014-08-02 ENCOUNTER — Other Ambulatory Visit: Payer: Self-pay

## 2014-08-02 NOTE — Telephone Encounter (Signed)
Lipoderm sent in last week and verified with pharmacy, they dont have any record of him being on vogel XO pump. I left a message on pt vm for him tcb to clarify if he needs the Twain HarteVogel or not. This is my half day so if he calls after i leave please relay info for me, thanks Ms. Olegario MessierKathy.

## 2014-08-02 NOTE — Telephone Encounter (Signed)
There is a phone note for the cream Testerone to go to [facility] from 5/13[facility]Marland Kitchen.  Pt following up on med request /dr hunter said ok.  "Refill testosterone lipoderm 5% cream apply 1 mL topically once daily as directed [facility]" pls advise

## 2014-08-02 NOTE — Telephone Encounter (Signed)
Ok, let's update his rx in system to lipoderm then if that is what's used. Thanks

## 2014-10-09 ENCOUNTER — Other Ambulatory Visit: Payer: No Typology Code available for payment source

## 2014-10-16 ENCOUNTER — Encounter: Payer: No Typology Code available for payment source | Admitting: Family Medicine

## 2014-10-18 ENCOUNTER — Other Ambulatory Visit: Payer: Self-pay

## 2014-10-23 ENCOUNTER — Encounter: Payer: No Typology Code available for payment source | Admitting: Family Medicine

## 2014-12-08 ENCOUNTER — Telehealth: Payer: Self-pay | Admitting: Family Medicine

## 2014-12-08 MED ORDER — LISINOPRIL-HYDROCHLOROTHIAZIDE 20-25 MG PO TABS: 1.0000 | ORAL_TABLET | Freq: Every day | ORAL | Status: DC

## 2014-12-08 NOTE — Telephone Encounter (Signed)
Pt request refill lisinopril-hydrochlorothiazide (PRINZIDE,ZESTORETIC) 20-25 MG per tablet 90 day  [facility]  Pt has cpe appt next week.  Pt states he has called the pharm since last Sunday. No record of a request.

## 2014-12-08 NOTE — Telephone Encounter (Signed)
Medication sent in. 

## 2014-12-11 ENCOUNTER — Other Ambulatory Visit (INDEPENDENT_AMBULATORY_CARE_PROVIDER_SITE_OTHER): Payer: Self-pay

## 2014-12-11 DIAGNOSIS — Z0000 Encounter for general adult medical examination without abnormal findings: Secondary | ICD-10-CM

## 2014-12-11 LAB — CBC WITH DIFFERENTIAL/PLATELET
BASOS ABS: 0 10*3/uL (ref 0.0–0.1)
Basophils Relative: 0.4 % (ref 0.0–3.0)
EOS ABS: 0.2 10*3/uL (ref 0.0–0.7)
Eosinophils Relative: 2.6 % (ref 0.0–5.0)
HCT: 46.3 % (ref 39.0–52.0)
Hemoglobin: 15.5 g/dL (ref 13.0–17.0)
LYMPHS ABS: 2.1 10*3/uL (ref 0.7–4.0)
Lymphocytes Relative: 34.8 % (ref 12.0–46.0)
MCHC: 33.5 g/dL (ref 30.0–36.0)
MCV: 82.4 fl (ref 78.0–100.0)
MONO ABS: 0.5 10*3/uL (ref 0.1–1.0)
Monocytes Relative: 8.1 % (ref 3.0–12.0)
NEUTROS PCT: 54.1 % (ref 43.0–77.0)
Neutro Abs: 3.3 10*3/uL (ref 1.4–7.7)
Platelets: 185 10*3/uL (ref 150.0–400.0)
RBC: 5.62 Mil/uL (ref 4.22–5.81)
RDW: 13.2 % (ref 11.5–15.5)
WBC: 6.1 10*3/uL (ref 4.0–10.5)

## 2014-12-11 LAB — BASIC METABOLIC PANEL
BUN: 18 mg/dL (ref 6–23)
CALCIUM: 9.4 mg/dL (ref 8.4–10.5)
CO2: 31 mEq/L (ref 19–32)
CREATININE: 0.84 mg/dL (ref 0.40–1.50)
Chloride: 100 mEq/L (ref 96–112)
GFR: 101.76 mL/min (ref >60.00)
GLUCOSE: 118 mg/dL — AB (ref 70–99)
Potassium: 4.2 mEq/L (ref 3.5–5.1)
Sodium: 141 mEq/L (ref 135–145)

## 2014-12-11 LAB — HEPATIC FUNCTION PANEL
ALK PHOS: 60 U/L (ref 39–117)
ALT: 34 U/L (ref 0–53)
AST: 24 U/L (ref 0–37)
Albumin: 4.2 g/dL (ref 3.5–5.2)
BILIRUBIN TOTAL: 0.5 mg/dL (ref 0.2–1.2)
Bilirubin, Direct: 0.1 mg/dL (ref 0.0–0.3)
Total Protein: 6.8 g/dL (ref 6.0–8.3)

## 2014-12-11 LAB — POCT URINALYSIS DIPSTICK
Bilirubin, UA: NEGATIVE
Glucose, UA: NEGATIVE
Ketones, UA: NEGATIVE
Leukocytes, UA: NEGATIVE
NITRITE UA: NEGATIVE
PH UA: 7
PROTEIN UA: NEGATIVE
RBC UA: NEGATIVE
Spec Grav, UA: 1.02
UROBILINOGEN UA: 0.2

## 2014-12-11 LAB — TSH: TSH: 1.43 u[IU]/mL (ref 0.35–4.50)

## 2014-12-11 LAB — LIPID PANEL
CHOLESTEROL: 238 mg/dL — AB (ref 0–200)
HDL: 49.3 mg/dL (ref >39.00)
LDL Cholesterol: 160 mg/dL — ABNORMAL HIGH (ref 0–99)
NonHDL: 188.61
TRIGLYCERIDES: 144 mg/dL (ref 0.0–149.0)
Total CHOL/HDL Ratio: 5
VLDL: 28.8 mg/dL (ref 0.0–40.0)

## 2014-12-11 LAB — PSA: PSA: 0.45 ng/mL (ref 0.10–4.00)

## 2014-12-18 ENCOUNTER — Encounter: Payer: Self-pay | Admitting: Family Medicine

## 2014-12-27 ENCOUNTER — Encounter: Payer: Self-pay | Admitting: Family Medicine

## 2014-12-27 ENCOUNTER — Ambulatory Visit (INDEPENDENT_AMBULATORY_CARE_PROVIDER_SITE_OTHER): Payer: BLUE CROSS/BLUE SHIELD | Admitting: Family Medicine

## 2014-12-27 VITALS — BP 120/70 | HR 74 | Temp 99.3°F | Ht 74.0 in | Wt 261.0 lb

## 2014-12-27 DIAGNOSIS — E669 Obesity, unspecified: Secondary | ICD-10-CM

## 2014-12-27 DIAGNOSIS — E785 Hyperlipidemia, unspecified: Secondary | ICD-10-CM

## 2014-12-27 DIAGNOSIS — E291 Testicular hypofunction: Secondary | ICD-10-CM

## 2014-12-27 DIAGNOSIS — R739 Hyperglycemia, unspecified: Secondary | ICD-10-CM

## 2014-12-27 DIAGNOSIS — Z0000 Encounter for general adult medical examination without abnormal findings: Secondary | ICD-10-CM | POA: Diagnosis not present

## 2014-12-27 DIAGNOSIS — M67911 Unspecified disorder of synovium and tendon, right shoulder: Secondary | ICD-10-CM | POA: Insufficient documentation

## 2014-12-27 DIAGNOSIS — Z23 Encounter for immunization: Secondary | ICD-10-CM | POA: Diagnosis not present

## 2014-12-27 DIAGNOSIS — Z20828 Contact with and (suspected) exposure to other viral communicable diseases: Secondary | ICD-10-CM

## 2014-12-27 DIAGNOSIS — I10 Essential (primary) hypertension: Secondary | ICD-10-CM

## 2014-12-27 DIAGNOSIS — E119 Type 2 diabetes mellitus without complications: Secondary | ICD-10-CM | POA: Insufficient documentation

## 2014-12-27 MED ORDER — MELOXICAM 15 MG PO TABS: 15.0000 mg | ORAL_TABLET | Freq: Every day | ORAL | Status: DC

## 2014-12-27 NOTE — Progress Notes (Signed)
Jason Conch, MD Phone: [phone]  Subjective:  Patient presents today for their annual physical. Chief complaint-noted.  Also See problem oriented charting -Not doing well. 35 lbs up due to stress and poor choices. both parents with health issues. Lost one of best friends in January. Mother having to live with him right now. Not exercising. Not eating well. No longer on phentermine ROS- full  review of systems was completed and negative except as indicated in HPI and A+P  The following were reviewed and entered/updated in epic: Past Medical History  Diagnosis Date  . HYPOGONADISM 01/08/2010  . HYPERLIPIDEMIA 12/09/2006  . ERECTILE DYSFUNCTION, MILD 08/02/2007  . HYPERTENSION 12/09/2006  . GERD 12/09/2006  . PLANTAR FASCIITIS 01/21/2010  . RADIAL NERVE INJURY 01/21/2010  . COLONIC POLYPS, HX OF 12/09/2006   Patient Active Problem List   Diagnosis Date Noted  . Hyperglycemia 12/27/2014    Priority: Medium  . Obesity 04/17/2014    Priority: Medium  . Depression, major, recurrent, mild (HCC) 04/22/2010    Priority: Medium  . Hypogonadism in male 01/08/2010    Priority: Medium  . ERECTILE DYSFUNCTION, MILD 08/02/2007    Priority: Medium  . Hyperlipidemia 12/09/2006    Priority: Medium  . Essential hypertension 12/09/2006    Priority: Medium  . History of colonic polyps 12/09/2006    Priority: Medium  . Seborrheic dermatitis 04/17/2014    Priority: Low  . PLANTAR FASCIITIS 01/21/2010    Priority: Low  . RADIAL NERVE INJURY 01/21/2010    Priority: Low  . Allergic rhinitis 12/09/2006    Priority: Low  . GERD 12/09/2006    Priority: Low  . Tendinopathy of right rotator cuff 12/27/2014   Past Surgical History  Procedure Laterality Date  . Colonoscopy  2008, 2011    Family History  Problem Relation Age of Onset  . Breast cancer Mother   . Pancreatic cancer Maternal Grandmother   . Diabetes Father   . Hypertension Father     mother  . Lung cancer Maternal  Grandfather     smoker  . Hyperlipidemia Father     Medications- reviewed and updated Current Outpatient Prescriptions  Medication Sig Dispense Refill  . ezetimibe-simvastatin (VYTORIN) 10-40 MG per tablet Take 1 tablet by mouth. 1 tab 3 times a week    . fluticasone (FLONASE) 50 MCG/ACT nasal spray PLACE 2 SPRAYS INTO THE NOSE DAILY. 16 g 0  . lisinopril-hydrochlorothiazide (PRINZIDE,ZESTORETIC) 20-25 MG per tablet Take 1 tablet by mouth daily. 90 tablet 3  . meloxicam (MOBIC) 7.5 MG tablet Take 7.5 mg by mouth daily.    . Multiple Vitamin (MULTIVITAMIN) tablet Take 1 tablet by mouth daily.      . pantoprazole (PROTONIX) 40 MG tablet TAKE 1 TABLET (40 MG TOTAL) BY MOUTH DAILY. 30 tablet 1  . phentermine 15 MG capsule Take 1 capsule (15 mg total) by mouth every morning. 30 capsule 0  . pimecrolimus (ELIDEL) 1 % cream USE AS DIRECTED 30 g 6  . Testosterone (VOGELXO PUMP) 12.5 MG/ACT (1%) GEL Place 2 application onto the skin daily. 75 g 3   No current facility-administered medications for this visit.    Allergies-reviewed and updated No Known Allergies  Social History   Social History  . Marital Status: Single    Spouse Name: N/A  . Number of Children: N/A  . Years of Education: N/A   Occupational History  . OWNER   . Scientist, research (medical)    Social History Main Topics  . Smoking  status: Never Smoker   . Smokeless tobacco: None  . Alcohol Use: 0.0 - 3.0 oz/week    0-5 Standard drinks or equivalent per week  . Drug Use: No  . Sexual Activity: Not Asked   Other Topics Concern  . None   Social History Narrative   Lives alone with mom living at him at times.       Work as a Firefighter: working on house, garden, shop    ROS--See HPI   Objective: BP 120/70 mmHg  Pulse 74  Temp(Src) 99.3 F (37.4 C)  Ht  (1.88 m)  Wt 261 lb (118.389 kg)  BMI 33.50 kg/m2 Gen: NAD, resting comfortably HEENT: Mucous membranes are moist. Oropharynx normal Neck: no  thyromegaly CV: RRR no murmurs rubs or gallops Lungs: CTAB no crackles, wheeze, rhonchi Abdomen: soft/nontender/nondistended/normal bowel sounds. No rebound or guarding. obese Rectal: normal tone, normal prostate, no masses or tenderness Ext: no edema Skin: warm, dry Neuro: grossly normal, moves all extremities, PERRLA  Assessment/Plan:  54 y.o. male presenting for annual physical.  Health Maintenance counseling: 1. Anticipatory guidance: Patient counseled regarding regular dental exams, eye doctor for contacts, wearing seatbelts, wearing sunscreen 2. Risk factor reduction:  Advised patient of need for regular exercise and diet rich and fruits and vegetables to reduce risk of heart attack and stroke.  3. Immunizations/screenings/ancillary studies Health Maintenance Due  Topic Date Due  . Hepatitis C Screening - return for labs 08/28/1962  . HIV Screening - return for labs 05/14/1977  . INFLUENZA VACCINE - today 10/16/2014  4. Prostate cancer screening- low risk based off PSA and rectal   Lab Results  Component Value Date   PSA 0.45 12/11/2014   PSA 0.69 06/07/2013   PSA 0.44 05/10/2008  5. Colon cancer screening - 07/23/2009- polyp found but was hyperplastic- they advised 5 year follow up- he received letter- will call to schedule 6. Skin cancer screening- no dermatology, skin exam today low risk. Advised no tanning bed- does intermittent  Essential hypertension S: controlled on repeat but initially 150/90 A/P: do not think best phentermine candidate with hypertension though ahs been controlled on Lisinopril-hctz 20-25mg     Hyperlipidemia S:mild poor control despite vytorin. Worse control off but 5.6% 10 year risk of heart attack or stroke without A/P: continue off rx unless risk >7.5%    Hypogonadism in male S: some fatigue- wonders if testosterone off A/P: return for testosterone levels. Does not have to be fasting but will obtain that way.    Hyperglycemia S:CBG  118 A/P: at risk diabetes, needs weight loss- advised.    Obesity S:Phentermine helpful for weight loss in past Takes M-F- was using for maintenance for >4 years. Not approved for this use. Unclear long term effects. Off of this patient gained 35 lbs.  A/P: do not prefer weight gain but not indicated for long term weight maintenance= phentermine. Consider nutrition therapy if cannot turn corner. Encouraged need for healthy eating, regular exercise, weight loss.     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 Hurts at work at times. Mild to moderate at least 3 months of aching O: Shoulder:Inspection reveals no abnormalities, atrophy or asymmetry. Palpation is normal with no tenderness over AC joint or bicipital groove. ROM is full in all planes. Rotator cuff strength normal throughout. Signs of impingement with positive Neer and Hawkin's tests, empty can. Pain with push  off from back as well. No painful arc and no drop arm sign. A/P: discussed mobic and home exercises. Follow up in a month if not at least 50% better-consider steroid shot vs. Ortho visit. Some risk with injection given at risk for diabetes.     4 months   Future fasting Orders Placed This Encounter  Procedures  . Flu Vaccine QUAD 36+ mos IM  . Testosterone, Free, Total, SHBG    Standing Status: Future     Number of Occurrences:      Standing Expiration Date: 12/27/2015  . Hepatitis C antibody, reflex    [facility]    Standing Status: Future     Number of Occurrences:      Standing Expiration Date: 12/27/2015  . HIV antibody    [facility]    Standing Status: Future     Number of Occurrences:      Standing Expiration Date: 12/27/2015    Meds ordered this encounter  Medications  . esomeprazole (NEXIUM) 20 MG capsule    Sig: Take 40 mg by mouth daily at 12 noon. OTC  . meloxicam (MOBIC) 15 MG tablet    Sig: Take 1 tablet (15 mg total) by mouth daily.     Dispense:  30 tablet    Refill:  0

## 2014-12-27 NOTE — Assessment & Plan Note (Signed)
S:CBG 118 A/P: at risk diabetes, needs weight loss- advised.

## 2014-12-27 NOTE — Assessment & Plan Note (Signed)
S:mild poor control despite vytorin. Worse control off but 5.6% 10 year risk of heart attack or stroke without A/P: continue off rx unless risk >7.5%

## 2014-12-27 NOTE — Assessment & Plan Note (Signed)
S:Phentermine helpful for weight loss in past Takes M-F- was using for maintenance for >4 years. Not approved for this use. Unclear long term effects. Off of this patient gained 35 lbs.  A/P: do not prefer weight gain but not indicated for long term weight maintenance= phentermine. Consider nutrition therapy if cannot turn corner. Encouraged need for healthy eating, regular exercise, weight loss.

## 2014-12-27 NOTE — Assessment & Plan Note (Signed)
S: controlled on repeat but initially 150/90 A/P: do not think best phentermine candidate with hypertension though ahs been controlled on Lisinopril-hctz 20-25mg 

## 2014-12-27 NOTE — Patient Instructions (Addendum)
Flu shot received today.  Really need to turn tide on weight 5: fruits and vegetables per day (work on 9 per day if you are at 5) 4: exercise 4-5 times per week for at least 30 minutes (walking counts!) 3: meals per day (don't skip breakfast!) 2: habits to quit -smoking -excess alcohol use (men >2 beer/day; women >1beer/day) 0-1: sweet per day (2 cookies, 1 small cup of ice cream, 12 oz soda)  Right shoulder- rotator cuff injury. Mobic x 10 days then in 3 days start home exercises. Ice shoulder for 3 days as well. May need formal PT or orthopedics.   Schedule a lab visit at the front desk. Return for future fasting labs between 8-9 AM to check testosterone, HIV, Hepatitis C. Nothing but water after midnight please.    Call your GI doc for colonoscopy

## 2014-12-27 NOTE — Assessment & Plan Note (Signed)
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 Hurts at work at times. Mild to moderate at least 3 months of aching O: Shoulder:Inspection reveals no abnormalities, atrophy or asymmetry. Palpation is normal with no tenderness over AC joint or bicipital groove. ROM is full in all planes. Rotator cuff strength normal throughout. Signs of impingement with positive Neer and Hawkin's tests, empty can. Pain with push off from back as well. No painful arc and no drop arm sign. A/P: discussed mobic and home exercises. Follow up in a month if not at least 50% better-consider steroid shot vs. Ortho visit. Some risk with injection given at risk for diabetes.

## 2014-12-27 NOTE — Assessment & Plan Note (Signed)
S: some fatigue- wonders if testosterone off A/P: return for testosterone levels. Does not have to be fasting but will obtain that way.

## 2015-01-01 ENCOUNTER — Other Ambulatory Visit (INDEPENDENT_AMBULATORY_CARE_PROVIDER_SITE_OTHER): Payer: BLUE CROSS/BLUE SHIELD

## 2015-01-01 DIAGNOSIS — E291 Testicular hypofunction: Secondary | ICD-10-CM | POA: Diagnosis not present

## 2015-01-01 DIAGNOSIS — Z20828 Contact with and (suspected) exposure to other viral communicable diseases: Secondary | ICD-10-CM

## 2015-01-02 LAB — TESTOSTERONE, FREE, TOTAL, SHBG
Sex Hormone Binding: 16 nmol/L (ref 10–50)
Testosterone, Free: 51.9 pg/mL (ref 47.0–244.0)
Testosterone-% Free: 2.7 % (ref 1.6–2.9)
Testosterone: 192 ng/dL — ABNORMAL LOW (ref 300–890)

## 2015-01-02 LAB — HEPATITIS C ANTIBODY: HCV Ab: NEGATIVE

## 2015-01-02 LAB — HIV ANTIBODY (ROUTINE TESTING W REFLEX): HIV: NONREACTIVE

## 2015-01-03 ENCOUNTER — Other Ambulatory Visit: Payer: Self-pay

## 2015-01-26 ENCOUNTER — Other Ambulatory Visit: Payer: Self-pay | Admitting: Family Medicine

## 2015-02-13 ENCOUNTER — Telehealth: Payer: Self-pay | Admitting: Family Medicine

## 2015-02-13 DIAGNOSIS — E291 Testicular hypofunction: Secondary | ICD-10-CM

## 2015-02-13 MED ORDER — TESTOSTERONE 12.5 MG/ACT (1%) TD GEL
2.0000 | Freq: Every day | TRANSDERMAL | Status: DC
Start: 2015-02-13 — End: 2015-08-23

## 2015-02-13 NOTE — Telephone Encounter (Signed)
Refill ok? 

## 2015-02-13 NOTE — Telephone Encounter (Signed)
Yes thanks 

## 2015-02-13 NOTE — Telephone Encounter (Signed)
Pt request refill of the following: Testosterone (VOGELXO PUMP) 12.5 MG/ACT (1%) GEL   Phamacy:  [facility] [address]

## 2015-02-14 NOTE — Telephone Encounter (Signed)
Medication called in 

## 2015-02-25 ENCOUNTER — Other Ambulatory Visit: Payer: Self-pay | Admitting: Family Medicine

## 2015-04-30 ENCOUNTER — Ambulatory Visit: Payer: BLUE CROSS/BLUE SHIELD | Admitting: Family Medicine

## 2015-05-10 ENCOUNTER — Other Ambulatory Visit: Payer: Self-pay | Admitting: Gastroenterology

## 2015-05-10 DIAGNOSIS — R1011 Right upper quadrant pain: Secondary | ICD-10-CM

## 2015-05-14 ENCOUNTER — Ambulatory Visit (INDEPENDENT_AMBULATORY_CARE_PROVIDER_SITE_OTHER): Payer: BLUE CROSS/BLUE SHIELD | Admitting: Family Medicine

## 2015-05-14 ENCOUNTER — Encounter: Payer: Self-pay | Admitting: Family Medicine

## 2015-05-14 VITALS — BP 142/84 | HR 63 | Temp 98.4°F | Wt 260.0 lb

## 2015-05-14 DIAGNOSIS — I10 Essential (primary) hypertension: Secondary | ICD-10-CM | POA: Diagnosis not present

## 2015-05-14 DIAGNOSIS — F330 Major depressive disorder, recurrent, mild: Secondary | ICD-10-CM

## 2015-05-14 DIAGNOSIS — R1011 Right upper quadrant pain: Secondary | ICD-10-CM

## 2015-05-14 DIAGNOSIS — E669 Obesity, unspecified: Secondary | ICD-10-CM | POA: Diagnosis not present

## 2015-05-14 DIAGNOSIS — M67911 Unspecified disorder of synovium and tendon, right shoulder: Secondary | ICD-10-CM | POA: Diagnosis not present

## 2015-05-14 MED ORDER — DULOXETINE HCL 60 MG PO CPEP: 60.0000 mg | ORAL_CAPSULE | Freq: Every day | ORAL | Status: DC

## 2015-05-14 NOTE — Progress Notes (Signed)
Tana Conch, MD  Subjective:  Jason Lopez is a 55 y.o. year old very pleasant male patient who presents for/with See problem oriented charting ROS- no fever, chills, vomiting. No headache. No chest pain or shortness of breath. No headache or blurry vision.   Past Medical History-  Patient Active Problem List   Diagnosis Date Noted  . Hyperglycemia 12/27/2014    Priority: Medium  . Obesity 04/17/2014    Priority: Medium  . Depression, major, recurrent, mild (HCC) 04/22/2010    Priority: Medium  . Hypogonadism in male 01/08/2010    Priority: Medium  . ERECTILE DYSFUNCTION, MILD 08/02/2007    Priority: Medium  . Hyperlipidemia 12/09/2006    Priority: Medium  . Essential hypertension 12/09/2006    Priority: Medium  . History of colonic polyps 12/09/2006    Priority: Medium  . Seborrheic dermatitis 04/17/2014    Priority: Low  . PLANTAR FASCIITIS 01/21/2010    Priority: Low  . RADIAL NERVE INJURY 01/21/2010    Priority: Low  . Allergic rhinitis 12/09/2006    Priority: Low  . GERD 12/09/2006    Priority: Low  . RUQ abdominal pain 05/14/2015  . Tendinopathy of right rotator cuff 12/27/2014    Medications- reviewed and updated Current Outpatient Prescriptions  Medication Sig Dispense Refill  . esomeprazole (NEXIUM) 20 MG capsule Take 40 mg by mouth daily at 12 noon. OTC    . fluticasone (FLONASE) 50 MCG/ACT nasal spray PLACE 2 SPRAYS INTO THE NOSE DAILY. 16 g 0  . lisinopril-hydrochlorothiazide (PRINZIDE,ZESTORETIC) 20-25 MG per tablet Take 1 tablet by mouth daily. 90 tablet 3  . meloxicam (MOBIC) 15 MG tablet TAKE 1 TABLET BY MOUTH EVERY DAY 30 tablet 5  . Multiple Vitamin (MULTIVITAMIN) tablet Take 1 tablet by mouth daily.      . pimecrolimus (ELIDEL) 1 % cream USE AS DIRECTED 30 g 6  . Testosterone (VOGELXO PUMP) 12.5 MG/ACT (1%) GEL Place 2 application onto the skin daily. 75 g 3  . DULoxetine (CYMBALTA) 60 MG capsule Take 1 capsule (60 mg total) by mouth daily.  30 capsule 2   No current facility-administered medications for this visit.    Objective: BP 142/84 mmHg  Pulse 63  Temp(Src) 98.4 F (36.9 C)  Wt 260 lb (117.935 kg) Gen: NAD, resting comfortably CV: RRR no murmurs rubs or gallops Lungs: CTAB no crackles, wheeze, rhonchi Abdomen: soft/nontender/slight distension/normal bowel sounds. No rebound or guarding. Obese.  Ext: no edema Skin: warm, dry Neuro: grossly normal, moves all extremities  Assessment/Plan:  Obesity S: weight stable through holidays despite no phentermine A/P: encouraged continued healthy lifestyle changes   Essential hypertension S: very mild poor control on Lisinopril-hctz 20-25mg  with large cuff BP Readings from Last 3 Encounters:  05/14/15 142/84  12/27/14 120/70  04/17/14 102/64  A/P: previously well controlled, we opted not to change today. Gave dash handout and continue weight loss efforts.    Depression, major, recurrent, mild S: Patient admits to difficulty dealing with stress and some anxiety. Easily snaps/irritated.Thinks he would benefit from being back on cymbalta. Denies SI/HI. Multiple stressors- Mother still living with him, father now diagnosed with dementia.  Has done well on cymbalta in past A/P: restart cymbalta  - rx appears to potentially need prior authorization- if it does would consider alternative such as celexa or lexapro. See back in 6 weeks and consider uptitration  Tendinopathy of right rotator cuff R shoulder- mobic and home exercises. Did not improve drastically so went to  ortho Dr. Charlett Blake- shot worked for 3 weeks. Was told some arthritis. He is now hurting again (has not done exercises regularly) and was advised to return to Dr. Charlett Blake  RUQ abdominal pain S: Sometimes with RUQ pain or even epigastric or LUQ pain. No pain over weekend. This morning had yogurt and bagel and feels extremely bloated. Has had pain in mainly RUQ but also some band like pain to the left upper  quadrant. Wsa notreally food related- could wake up with it. Never nauseaous, no indigestion issues. Did bloodwork with Dr. Loreta Ave. Liver enzymes were elevated. He is seeing Dr. Loreta Ave with plan for 3/13 EGD and colonoscopy and 3/14 ultrasound. Concern of gallbladder involvement- has had labs which I do not have access too A/P: with elevated LFTs and RUQ pain- gallstones possible- asked patient to sign ROI for me to get records. He is in Dr. Kenna Gilbert capable hands right now though and she is driving workup. He needed updated colonoscopy regardless so this is reasonable. i did advise him to abstain from alcohol which he denies any recent usage. May need to lean away from nsaids as gastric ulcer or duodenal possible but does not really fit timecourse of pain.     Return in about 6 weeks (around 06/25/2015). for depression and BP follow up Return precautions advised.   Meds ordered this encounter  Medications  . DULoxetine (CYMBALTA) 60 MG capsule    Sig: Take 1 capsule (60 mg total) by mouth daily.    Dispense:  30 capsule    Refill:  2

## 2015-05-14 NOTE — Assessment & Plan Note (Signed)
S: weight stable through holidays despite no phentermine A/P: encouraged continued healthy lifestyle changes

## 2015-05-14 NOTE — Assessment & Plan Note (Signed)
R shoulder- mobic and home exercises. Did not improve drastically so went to ortho Dr. Charlett Blake- shot worked for 3 weeks. Was told some arthritis. He is now hurting again (has not done exercises regularly) and was advised to return to Dr. Charlett Blake

## 2015-05-14 NOTE — Assessment & Plan Note (Signed)
S: Patient admits to difficulty dealing with stress and some anxiety. Easily snaps/irritated.Thinks he would benefit from being back on cymbalta. Denies SI/HI. Multiple stressors- Mother still living with him, father now diagnosed with dementia.  Has done well on cymbalta in past A/P: restart cymbalta  - rx appears to potentially need prior authorization- if it does would consider alternative such as celexa or lexapro. See back in 6 weeks and consider uptitration

## 2015-05-14 NOTE — Assessment & Plan Note (Addendum)
S: Sometimes with RUQ pain or even epigastric or LUQ pain. No pain over weekend. This morning had yogurt and bagel and feels extremely bloated. Has had pain in mainly RUQ but also some band like pain to the left upper quadrant. Wsa notreally food related- could wake up with it. Never nauseaous, no indigestion issues. Did bloodwork with Dr. Loreta Ave. Liver enzymes were elevated. He is seeing Dr. Loreta Ave with plan for 3/13 EGD and colonoscopy and 3/14 ultrasound. Concern of gallbladder involvement- has had labs which I do not have access too A/P: with elevated LFTs and RUQ pain- gallstones possible- asked patient to sign ROI for me to get records. He is in Dr. Kenna Gilbert capable hands right now though and she is driving workup. He needed updated colonoscopy regardless so this is reasonable. i did advise him to abstain from alcohol which he denies any recent usage. May need to lean away from nsaids as gastric ulcer or duodenal possible but does not really fit timecourse of pain.

## 2015-05-14 NOTE — Patient Instructions (Addendum)
Blood pressure hair high- we will watch at 6 week follow up. See dash eating plan  Start cymbalta $RemoveBeforeDE vered by insurance we will trial alternative such as celexa. Follow up in 6 weeks  Great job losing a pound through a tough time to lose weight  Follow up with Dr. Charlett Blake  Ask Dr. Loreta Ave to make sure to send me a copy of workup. Sign release of information at the check out desk for these records is another alternative.       Taking the medicine as directed and not missing any doses is one of the best things you can do to treat your depression.  Here are some things to keep in mind:  1) Side effects (stomach upset, some increased anxiety) may happen before you notice a benefit.  These side effects typically go away over time. 2) Changes to your dose of medicine or a change in medication all together is sometimes necessary 3) Most people need to be on medication at least 6-12 months 4) Many people will notice an improvement within two weeks but the full effect of the medication can take up to 4-6 weeks 5) Stopping the medication when you start feeling better often results in a return of symptoms 6) If you start having thoughts of hurting yourself or others after starting this medicine, call our office immediately at [phone] or seek care through 911.    DASH Eating Plan DASH stands for "Dietary Approaches to Stop Hypertension." The DASH eating plan is a healthy eating plan that has been shown to reduce high blood pressure (hypertension). Additional health benefits may include reducing the risk of type 2 diabetes mellitus, heart disease, and stroke. The DASH eating plan may also help with weight loss. WHAT DO I NEED TO KNOW ABOUT THE DASH EATING PLAN? For the DASH eating plan, you will follow these general guidelines:  Choose foods with a percent daily value for sodium of less than 5% (as listed on the food label).  Use salt-free seasonings or herbs instead of table salt or  sea salt.  Check with your health care provider or pharmacist before using salt substitutes.  Eat lower-sodium products, often labeled as "lower sodium" or "no salt added."  Eat fresh foods.  Eat more vegetables, fruits, and low-fat dairy products.  Choose whole grains. Look for the word "whole" as the first word in the ingredient list.  Choose fish and skinless chicken or [place] more often than red meat. Limit fish, poultry, and meat to 6 oz (170 g) each day.  Limit sweets, desserts, sugars, and sugary drinks.  Choose heart-healthy fats.  Limit cheese to 1 oz (28 g) per day.  Eat more home-cooked food and less restaurant, buffet, and fast food.  Limit fried foods.  Cook foods using methods other than frying.  Limit canned vegetables. If you do use them, rinse them well to decrease the sodium.  When eating at a restaurant, ask that your food be prepared with less salt, or no salt if possible. WHAT FOODS CAN I EAT? Seek help from a dietitian for individual calorie needs. Grains Whole grain or whole wheat bread. Brown rice. Whole grain or whole wheat pasta. Quinoa, bulgur, and whole grain cereals. Low-sodium cereals. Corn or whole wheat flour tortillas. Whole grain cornbread. Whole grain crackers. Low-sodium crackers. Vegetables Fresh or frozen vegetables (raw, steamed, roasted, or grilled). Low-sodium or reduced-sodium tomato and vegetable juices. Low-sodium or reduced-sodium tomato sauce and paste. Low-sodium or reduced-sodium canned  vegetables.  Fruits All fresh, canned (in natural juice), or frozen fruits. Meat and Other Protein Products Ground beef (85% or leaner), grass-fed beef, or beef trimmed of fat. Skinless chicken or [place]. Ground chicken or [place]. Pork trimmed of fat. All fish and seafood. Eggs. Dried beans, peas, or lentils. Unsalted nuts and seeds. Unsalted canned beans. Dairy Low-fat dairy products, such as skim or 1% milk, 2% or reduced-fat cheeses, low-fat  ricotta or cottage cheese, or plain low-fat yogurt. Low-sodium or reduced-sodium cheeses. Fats and Oils Tub margarines without trans fats. Light or reduced-fat mayonnaise and salad dressings (reduced sodium). Avocado. Safflower, olive, or canola oils. Natural peanut or almond butter. Other Unsalted popcorn and pretzels. The items listed above may not be a complete list of recommended foods or beverages. Contact your dietitian for more options. WHAT FOODS ARE NOT RECOMMENDED? Grains White bread. White pasta. White rice. Refined cornbread. Bagels and croissants. Crackers that contain trans fat. Vegetables Creamed or fried vegetables. Vegetables in a cheese sauce. Regular canned vegetables. Regular canned tomato sauce and paste. Regular tomato and vegetable juices. Fruits Dried fruits. Canned fruit in light or heavy syrup. Fruit juice. Meat and Other Protein Products Fatty cuts of meat. Ribs, chicken wings, bacon, sausage, bologna, salami, chitterlings, fatback, hot dogs, bratwurst, and packaged luncheon meats. Salted nuts and seeds. Canned beans with salt. Dairy Whole or 2% milk, cream, half-and-half, and cream cheese. Whole-fat or sweetened yogurt. Full-fat cheeses or blue cheese. Nondairy creamers and whipped toppings. Processed cheese, cheese spreads, or cheese curds. Condiments Onion and garlic salt, seasoned salt, table salt, and sea salt. Canned and packaged gravies. Worcestershire sauce. Tartar sauce. Barbecue sauce. Teriyaki sauce. Soy sauce, including reduced sodium. Steak sauce. Fish sauce. Oyster sauce. Cocktail sauce. Horseradish. Ketchup and mustard. Meat flavorings and tenderizers. Bouillon cubes. Hot sauce. Tabasco sauce. Marinades. Taco seasonings. Relishes. Fats and Oils Butter, stick margarine, lard, shortening, ghee, and bacon fat. Coconut, palm kernel, or palm oils. Regular salad dressings. Other Pickles and olives. Salted popcorn and pretzels. The items listed above may not  be a complete list of foods and beverages to avoid. Contact your dietitian for more information. WHERE CAN I FIND MORE INFORMATION? National Heart, Lung, and Blood Institute: [employer]   This information is not intended to replace advice given to you by your health care provider. Make sure you discuss any questions you have with your health care provider.   Document Released: 02/20/2011 Document Revised: 03/24/2014 Document Reviewed: 01/05/2013 Elsevier Interactive Patient Education [employer]

## 2015-05-14 NOTE — Assessment & Plan Note (Signed)
S: very mild poor control on Lisinopril-hctz 20-25mg  with large cuff BP Readings from Last 3 Encounters:  05/14/15 142/84  12/27/14 120/70  04/17/14 102/64  A/P: previously well controlled, we opted not to change today. Gave dash handout and continue weight loss efforts.

## 2015-05-28 LAB — HM COLONOSCOPY

## 2015-05-29 ENCOUNTER — Encounter: Payer: Self-pay | Admitting: Family Medicine

## 2015-05-29 ENCOUNTER — Encounter (HOSPITAL_COMMUNITY): Payer: BLUE CROSS/BLUE SHIELD

## 2015-05-29 ENCOUNTER — Ambulatory Visit (HOSPITAL_COMMUNITY)
Admission: RE | Admit: 2015-05-29 | Discharge: 2015-05-29 | Disposition: A | Discharge status: Home / Self Care | Payer: BLUE CROSS/BLUE SHIELD | Source: Ambulatory Visit | Attending: Gastroenterology | Admitting: Gastroenterology

## 2015-05-29 DIAGNOSIS — R1011 Right upper quadrant pain: Secondary | ICD-10-CM

## 2015-06-15 ENCOUNTER — Encounter (HOSPITAL_COMMUNITY)
Admission: RE | Admit: 2015-06-15 | Discharge: 2015-06-15 | Disposition: A | Discharge status: Home / Self Care | Payer: BLUE CROSS/BLUE SHIELD | Source: Ambulatory Visit | Attending: Gastroenterology | Admitting: Gastroenterology

## 2015-06-15 ENCOUNTER — Ambulatory Visit (HOSPITAL_COMMUNITY)
Admission: RE | Admit: 2015-06-15 | Discharge: 2015-06-15 | Disposition: A | Discharge status: Home / Self Care | Payer: BLUE CROSS/BLUE SHIELD | Source: Ambulatory Visit | Attending: Gastroenterology | Admitting: Gastroenterology

## 2015-06-15 DIAGNOSIS — R1011 Right upper quadrant pain: Secondary | ICD-10-CM | POA: Insufficient documentation

## 2015-06-15 IMAGING — NM NM HEPATO W/GB/PHARM/[PERSON_NAME]
2 series · 12 of 12 positions shown · non-contrast
Comparison: None.

CLINICAL DATA: Right upper quadrant abdominal pain and nausea for 6
months.

EXAM:
NUCLEAR MEDICINE HEPATOBILIARY IMAGING WITH GALLBLADDER EF
TECHNIQUE: Sequential images of the abdomen were obtained [DATE] minutes
following intravenous administration of radiopharmaceutical. After
oral ingestion of Ensure, gallbladder ejection fraction was
determined. At 60 min, normal ejection fraction is greater than 33%.
RADIOPHARMACEUTICALS:  5.5 mCi [GI]  Choletec IV

[Series 1: biliary · 3.25mm/px · 6 of 60 frames shown]
[frame 6/60]
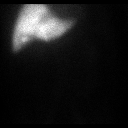
[frame 16/60]
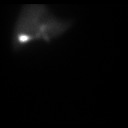
[frame 26/60]
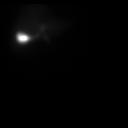
[frame 36/60]
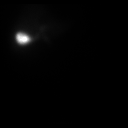
[frame 46/60]
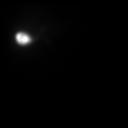
[frame 56/60]
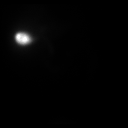

[Series 2: gbef · 3.25mm/px · 6 of 60 frames shown]
[frame 6/60]
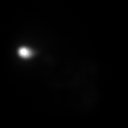
[frame 16/60]
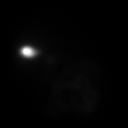
[frame 26/60]
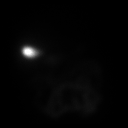
[frame 36/60]
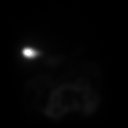
[frame 46/60]
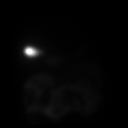
[frame 56/60]
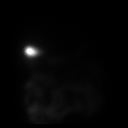

[12 of 12 positions shown; findings below may reference images not displayed]

FINDINGS: Prompt uptake and biliary excretion of activity by the liver is
seen. Gallbladder activity is visualized, consistent with patency of
cystic duct. Biliary activity passes into small bowel, consistent
with patent common bile duct.

Calculated gallbladder ejection fraction is 50%. (Normal gallbladder
ejection fraction with Ensure is greater than 33%.)
IMPRESSION: Normal hepatobiliary imaging. Both cystic and common bile ducts are
patent.

Gallbladder ejection fraction of 50%, within normal limits.

## 2015-06-15 MED ORDER — TECHNETIUM TC 99M MEBROFENIN IV KIT
5.5000 | PACK | Freq: Once | INTRAVENOUS | Status: AC | PRN
  Administered 2015-06-15: 5.5 via INTRAVENOUS

## 2015-06-18 ENCOUNTER — Other Ambulatory Visit: Payer: Self-pay | Admitting: Gastroenterology

## 2015-06-18 DIAGNOSIS — R1012 Left upper quadrant pain: Secondary | ICD-10-CM

## 2015-06-18 DIAGNOSIS — R1011 Right upper quadrant pain: Secondary | ICD-10-CM

## 2015-06-22 ENCOUNTER — Ambulatory Visit
Admission: RE | Admit: 2015-06-22 | Discharge: 2015-06-22 | Disposition: A | Discharge status: Home / Self Care | Payer: BLUE CROSS/BLUE SHIELD | Source: Ambulatory Visit | Attending: Gastroenterology | Admitting: Gastroenterology

## 2015-06-22 DIAGNOSIS — R1011 Right upper quadrant pain: Secondary | ICD-10-CM | POA: Diagnosis not present

## 2015-06-22 DIAGNOSIS — R1012 Left upper quadrant pain: Secondary | ICD-10-CM

## 2015-06-22 IMAGING — CT CT ABD-PELV W/ CM
3 of 5 series · 12 of 36 positions shown, 18 images · IV contrast (READICAT/WATER & [ID] ISOVUE 300)
Comparison: None.

CLINICAL DATA: Right upper quadrant and left upper quadrant pain
radiating to mid abdomen for 6 months. Nausea.

EXAM:
CT ABDOMEN AND PELVIS WITH CONTRAST
TECHNIQUE: Multidetector CT imaging of the abdomen and pelvis was performed
using the standard protocol following bolus administration of
intravenous contrast.
CONTRAST:  125mL [GR] IOPAMIDOL ([GR]) INJECTION 61%

[Series 3: abd/pelvis with · axial · 0.86mm/px · z∈[-315,+35]mm · 7 of 94 slices shown, 12 images]
[im 12/94  soft-tissue]
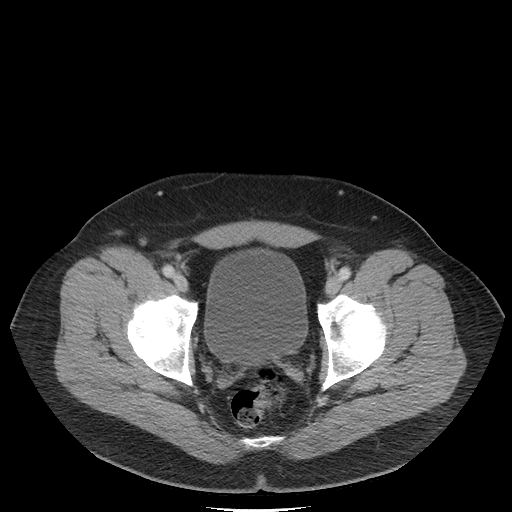
[im 12/94  bone]
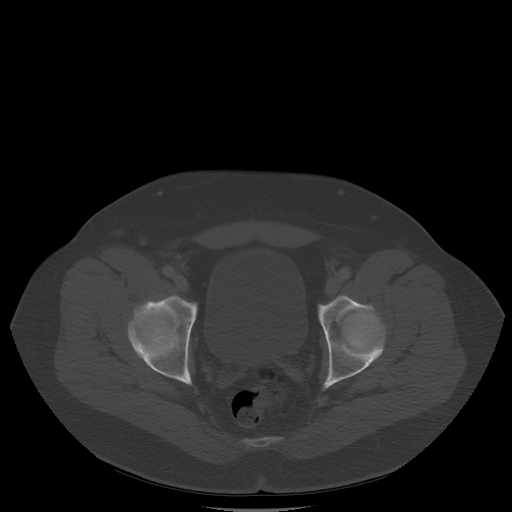
[im 24/94  soft-tissue]
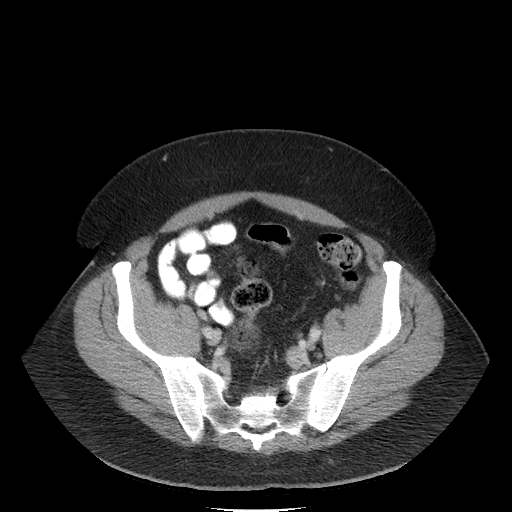
[im 35/94  soft-tissue]
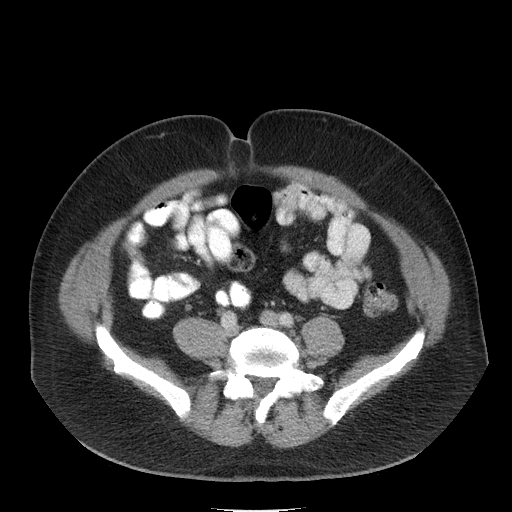
[im 47/94  soft-tissue]
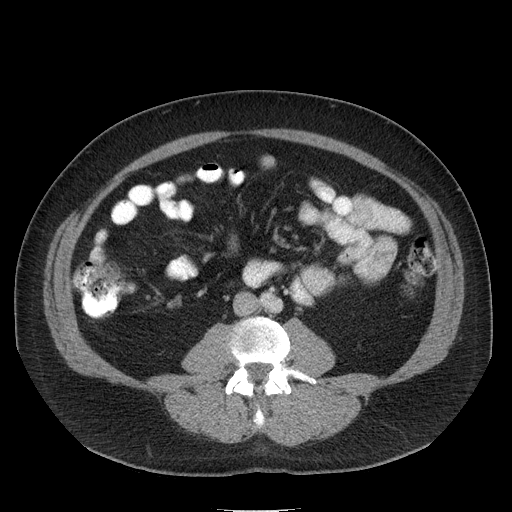
[im 47/94  lung]
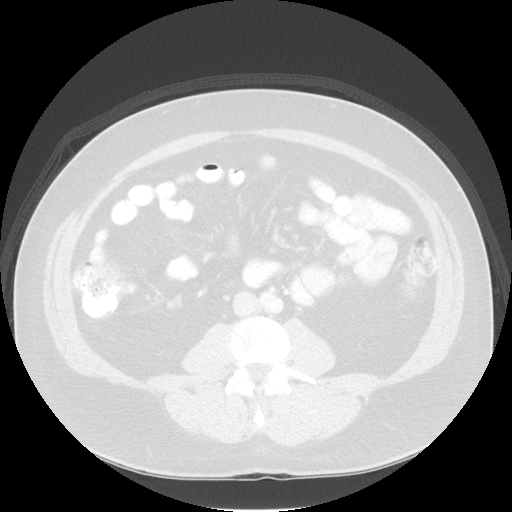
[im 59/94  soft-tissue]
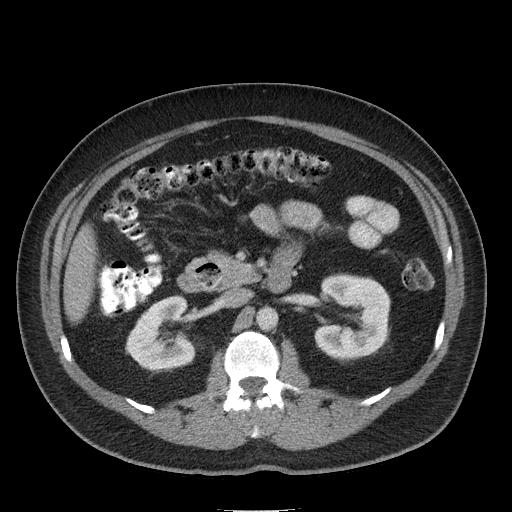
[im 59/94  lung]
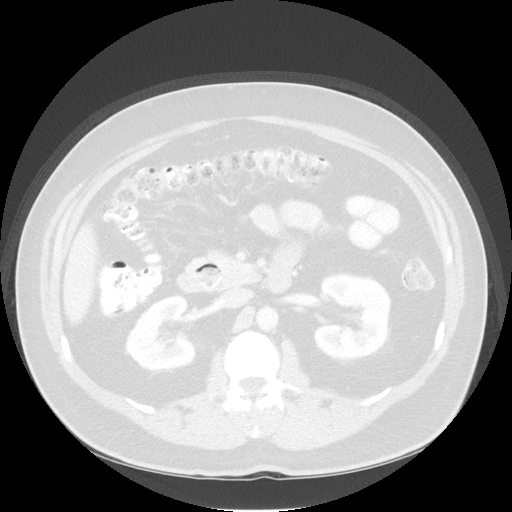
[im 70/94  soft-tissue]
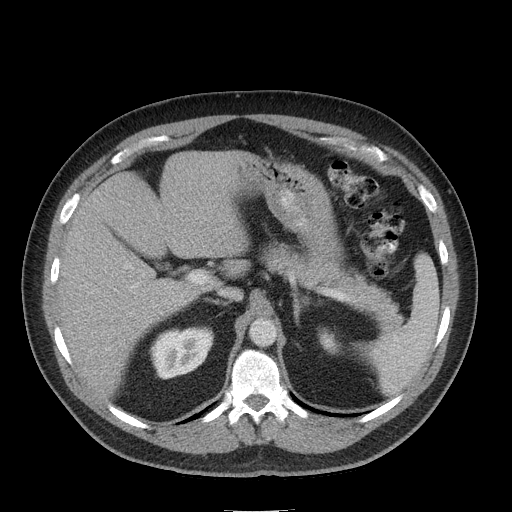
[im 70/94  lung]
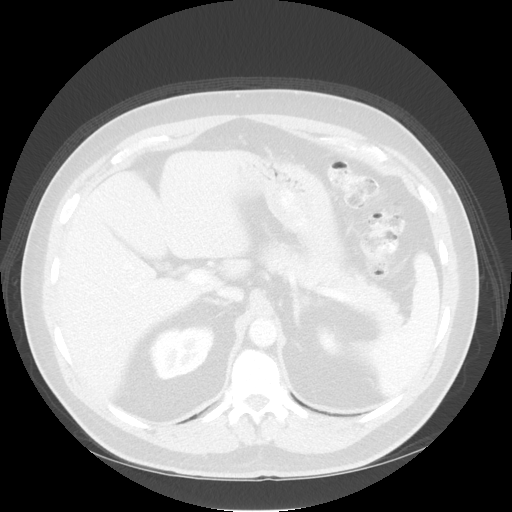
[im 82/94  soft-tissue]
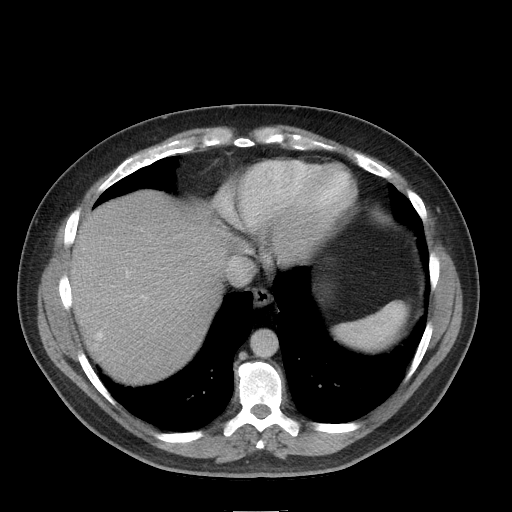
[im 82/94  lung]
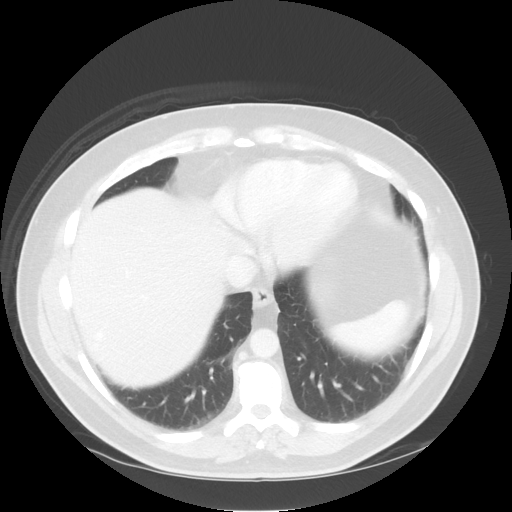

[Series 601: coronal body · coronal · 1.02mm/px · 1 of 144 slices shown, 2 images]
[im 48/144  soft-tissue]
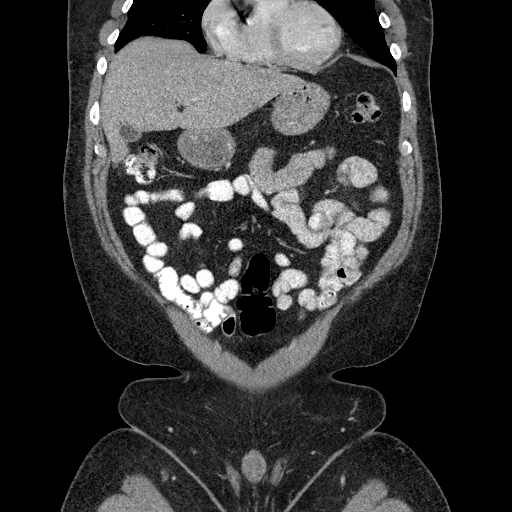
[im 48/144  bone]
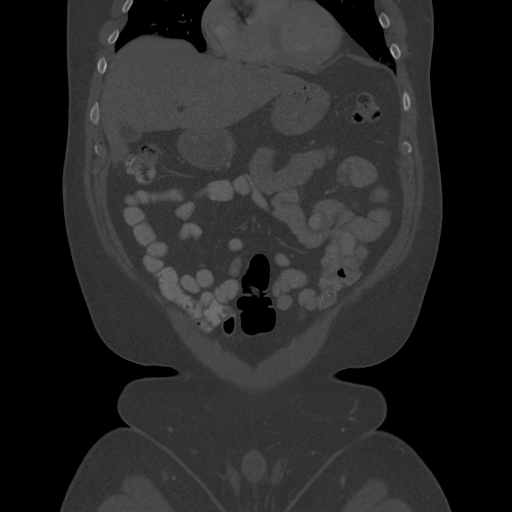

[Series 602: sagittal body · sagittal · 1.02mm/px · 4 of 176 slices shown]
[im 11/176  soft-tissue]
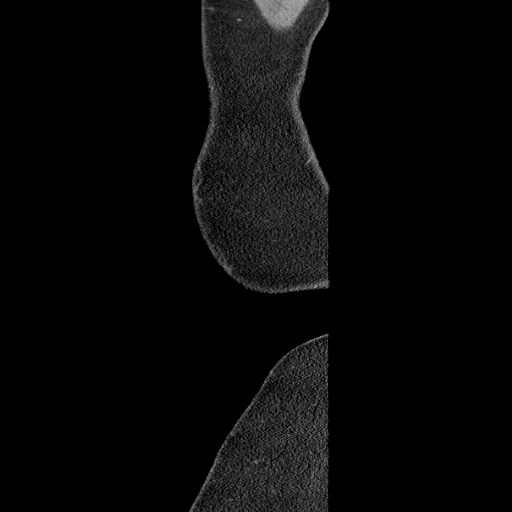
[im 33/176  soft-tissue]
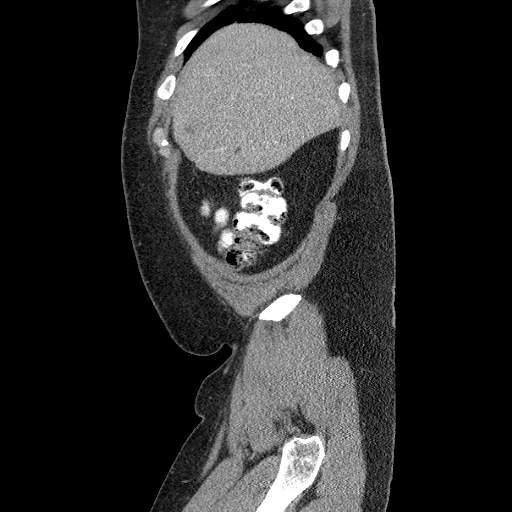
[im 55/176  soft-tissue]
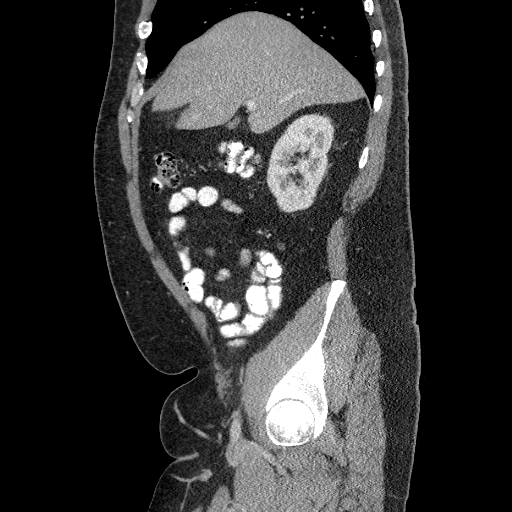
[im 77/176  soft-tissue]
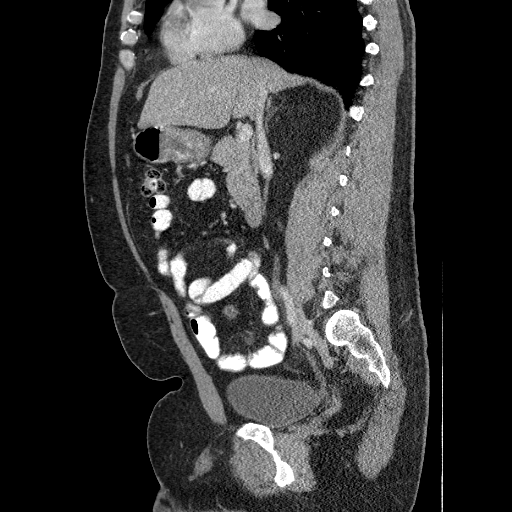

[12 of 36 positions shown; findings below may reference images not displayed]

FINDINGS: Lower chest:  No acute findings.

Hepatobiliary: A sub-cm low-attenuation lesion is seen in the
anterior liver dome on image 17 series 3 which most likely
represents a tiny cyst. A tiny hypervascular focus measuring 9 mm
cysts also seen in the posterior dome of the right lobe on image 13
of series 3, which is likely benign in etiology such as a flash
filling hemangioma. Gallbladder is unremarkable.

Pancreas: No mass, inflammatory changes, or other significant
abnormality.

Spleen: Within normal limits in size and appearance.

Adrenals/Urinary Tract: No masses identified. No evidence of
hydronephrosis. Unopacified urinary bladder is unremarkable in
appearance.

Stomach/Bowel: No evidence of obstruction, inflammatory process, or
abnormal fluid collections. Normal appendix visualized. 2.4 cm
duodenal diverticulum incidentally noted. Moderate stool burden
noted.

Vascular/Lymphatic: No pathologically enlarged lymph nodes. No
evidence of abdominal aortic aneurysm.

Reproductive: No mass or other significant abnormality.

Other: None.

Musculoskeletal: No suspicious bone lesions identified. Probable
tiny benign bone islands noted within the pelvis.
IMPRESSION: No acute findings within the abdomen or pelvis.

Tiny sub-cm indeterminate liver lesions, which are likely benign.
Consider follow-up imaging in 6 months, with abdomen MRI without and
with contrast as preferred modality. This recommendation follows ACR
consensus guidelines: Managing Incidental Findings on Abdominal CT:
White Paper of the ACR Incidental Findings Committee. [HOSPITAL] [GR];[DATE]

## 2015-06-22 MED ORDER — IOPAMIDOL (ISOVUE-300) INJECTION 61%
125.0000 mL | Freq: Once | INTRAVENOUS | Status: AC | PRN
  Administered 2015-06-22: 125 mL via INTRAVENOUS

## 2015-06-25 ENCOUNTER — Ambulatory Visit (INDEPENDENT_AMBULATORY_CARE_PROVIDER_SITE_OTHER): Payer: BLUE CROSS/BLUE SHIELD | Admitting: Family Medicine

## 2015-06-25 ENCOUNTER — Encounter: Payer: Self-pay | Admitting: Family Medicine

## 2015-06-25 VITALS — BP 122/68 | HR 73 | Temp 98.7°F | Wt 256.0 lb

## 2015-06-25 DIAGNOSIS — I10 Essential (primary) hypertension: Secondary | ICD-10-CM

## 2015-06-25 DIAGNOSIS — F330 Major depressive disorder, recurrent, mild: Secondary | ICD-10-CM

## 2015-06-25 DIAGNOSIS — R1011 Right upper quadrant pain: Secondary | ICD-10-CM

## 2015-06-25 NOTE — Assessment & Plan Note (Signed)
S: cymbalta 60mg  restarted last visit. phq9 of 3 with no depressed mood and no anhedonia. Enjoying activities more A/P: continue current medication- well controlled.

## 2015-06-25 NOTE — Assessment & Plan Note (Signed)
S:Still has some issues with pain. Workup including CT, NM hepatobiliary, EGD unrevealing. Did have moderate stool burden. Primarily managed by Dr. Loreta AveMann. US no gallstones.  A/P:Advised patient to trial miralax (does get constipated at times) could be etiology of pain. Continue PPI for now.

## 2015-06-25 NOTE — Progress Notes (Signed)
Tana Conch, MD  Subjective:  Jason Lopez is a 55 y.o. year old very pleasant male patient who presents for/with See problem oriented charting ROS- No chest pain or shortness of breath. No headache or blurry vision.   Past Medical History-  Patient Active Problem List   Diagnosis Date Noted  . Hyperglycemia 12/27/2014    Priority: Medium  . Obesity 04/17/2014    Priority: Medium  . Depression, major, recurrent, mild (HCC) 04/22/2010    Priority: Medium  . Hypogonadism in male 01/08/2010    Priority: Medium  . ERECTILE DYSFUNCTION, MILD 08/02/2007    Priority: Medium  . Hyperlipidemia 12/09/2006    Priority: Medium  . Essential hypertension 12/09/2006    Priority: Medium  . Seborrheic dermatitis 04/17/2014    Priority: Low  . PLANTAR FASCIITIS 01/21/2010    Priority: Low  . RADIAL NERVE INJURY 01/21/2010    Priority: Low  . Allergic rhinitis 12/09/2006    Priority: Low  . GERD 12/09/2006    Priority: Low  . History of colonic polyps 12/09/2006    Priority: Low  . RUQ abdominal pain 05/14/2015  . Tendinopathy of right rotator cuff 12/27/2014    Medications- reviewed and updated Current Outpatient Prescriptions  Medication Sig Dispense Refill  . DULoxetine (CYMBALTA) 60 MG capsule Take 1 capsule (60 mg total) by mouth daily. 30 capsule 2  . esomeprazole (NEXIUM) 20 MG capsule Take 40 mg by mouth daily at 12 noon. OTC    . fluticasone (FLONASE) 50 MCG/ACT nasal spray PLACE 2 SPRAYS INTO THE NOSE DAILY. 16 g 0  . lisinopril-hydrochlorothiazide (PRINZIDE,ZESTORETIC) 20-25 MG per tablet Take 1 tablet by mouth daily. 90 tablet 3  . meloxicam (MOBIC) 15 MG tablet TAKE 1 TABLET BY MOUTH EVERY DAY 30 tablet 5  . Multiple Vitamin (MULTIVITAMIN) tablet Take 1 tablet by mouth daily.      . pimecrolimus (ELIDEL) 1 % cream USE AS DIRECTED 30 g 6  . Testosterone (VOGELXO PUMP) 12.5 MG/ACT (1%) GEL Place 2 application onto the skin daily. 75 g 3   No current  facility-administered medications for this visit.    Objective: BP 122/68 mmHg  Pulse 73  Temp(Src) 98.7 F (37.1 C)  Wt 256 lb (116.121 kg) Gen: NAD, resting comfortably CV: RRR no murmurs rubs or gallops Lungs: CTAB no crackles, wheeze, rhonchi Abdomen: soft/nontender/nondistended/normal bowel sounds. No rebound or guarding.  Some pain near bottom side of ribs on right side anteriorly Ext: no edema Skin: warm, dry Neuro: grossly normal, moves all extremities  Assessment/Plan:  Essential hypertension S: poorly controlled on lisinopril-hctz 20-25mg  with large cuff last check. Given dash diet and focused on weight loss efforts.  Wt Readings from Last 3 Encounters:  06/25/15 256 lb (116.121 kg)  05/14/15 260 lb (117.935 kg)  12/27/14 261 lb (118.389 kg)   BP Readings from Last 3 Encounters:  06/25/15 122/68  05/14/15 142/84  12/27/14 120/70  A/P:Continue current meds:  Well controlled with weight loss  Depression, major, recurrent, mild (HCC) S: cymbalta  restarted last visit. phq9 of 3 with no depressed mood and no anhedonia. Enjoying activities more A/P: continue current medication- well controlled.   RUQ abdominal pain S:Still has some issues with pain. Workup including CT, NM hepatobiliary, EGD unrevealing. Did have moderate stool burden. Primarily managed by Dr. Loreta Ave. [place] no gallstones.  A/P:Advised patient to trial miralax (does get constipated at times) could be etiology of pain. Continue PPI for now.    Return in  about 6 months (around 12/28/2015) for physical. or later. Return precautions advised.   The duration of face-to-face time during this visit was 25 minutes. Greater than 50% of this time was spent in counseling, explanation of diagnosis, planning of further management, and/or coordination of care.

## 2015-06-25 NOTE — Assessment & Plan Note (Signed)
S: poorly controlled on lisinopril-hctz 20-25mg  with large cuff last check. Given dash diet and focused on weight loss efforts.  Wt Readings from Last 3 Encounters:  06/25/15 256 lb (116.121 kg)  05/14/15 260 lb (117.935 kg)  12/27/14 261 lb (118.389 kg)   BP Readings from Last 3 Encounters:  06/25/15 122/68  05/14/15 142/84  12/27/14 120/70  A/P:Continue current meds:  Well controlled with weight loss

## 2015-06-25 NOTE — Patient Instructions (Signed)
Blood pressure looks far better with 4 lbs weight loss.   So glad the depression is doing better- continue cymbalta. Contact us with worsening symptoms or immediately if any thoughts of hurting yourself

## 2015-07-12 DIAGNOSIS — K219 Gastro-esophageal reflux disease without esophagitis: Secondary | ICD-10-CM | POA: Diagnosis not present

## 2015-07-12 DIAGNOSIS — R1011 Right upper quadrant pain: Secondary | ICD-10-CM | POA: Diagnosis not present

## 2015-07-12 DIAGNOSIS — Z86010 Personal history of colonic polyps: Secondary | ICD-10-CM | POA: Diagnosis not present

## 2015-07-12 DIAGNOSIS — E669 Obesity, unspecified: Secondary | ICD-10-CM | POA: Diagnosis not present

## 2015-07-13 ENCOUNTER — Other Ambulatory Visit: Payer: Self-pay | Admitting: Family Medicine

## 2015-07-24 IMAGING — US US ABDOMEN COMPLETE
1 series · 14 of 25 positions shown · non-contrast
Comparison: None.

CLINICAL DATA: Abdominal pain.

EXAM:
ABDOMEN ULTRASOUND COMPLETE

[Series 1: us abdomen complete · 0.25mm/px · 14 of 95 slices shown]
[im 1/95]
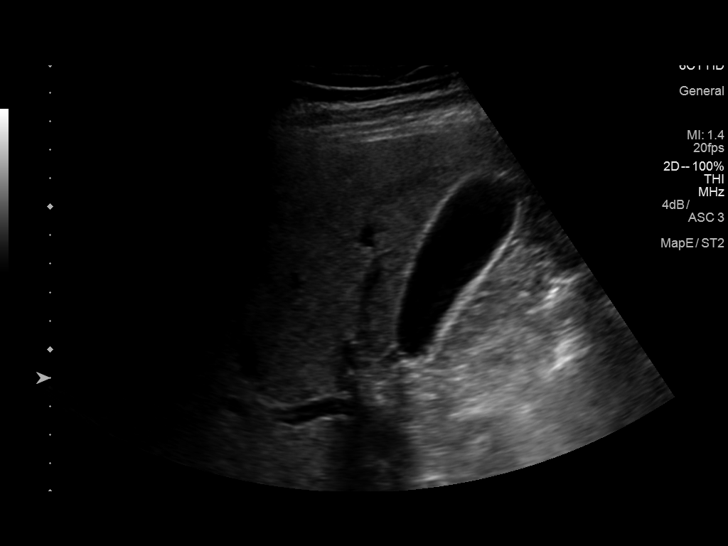
[im 8/95]
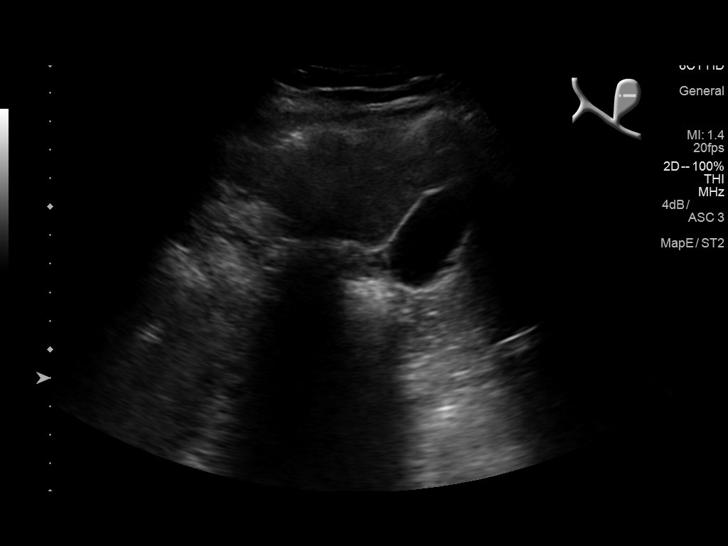
[im 16/95]
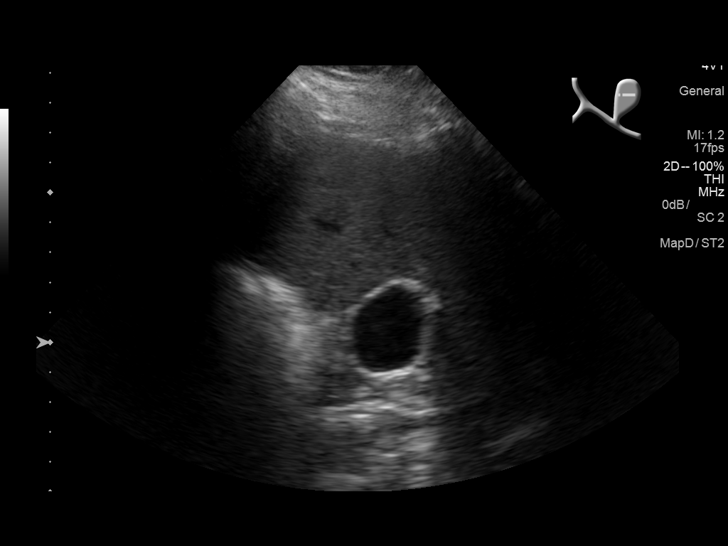
[im 24/95]
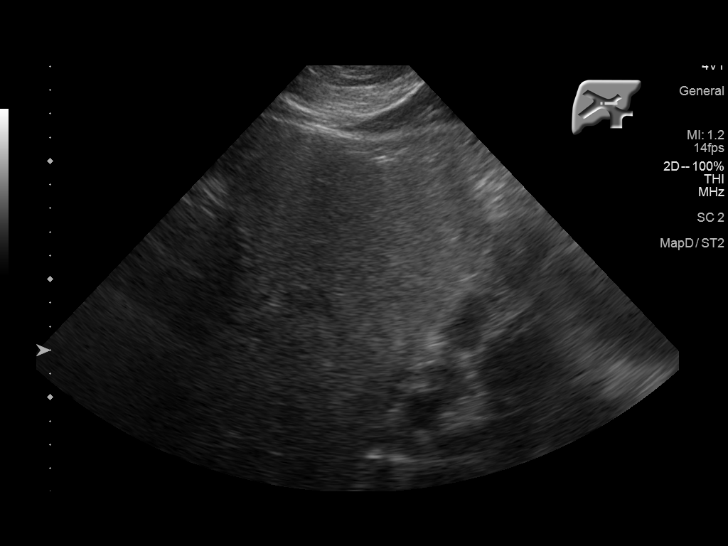
[im 32/95]
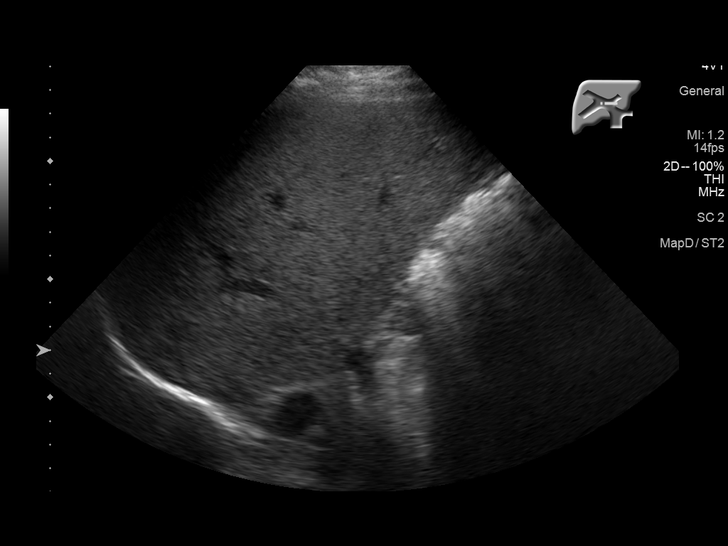
[im 36/95]
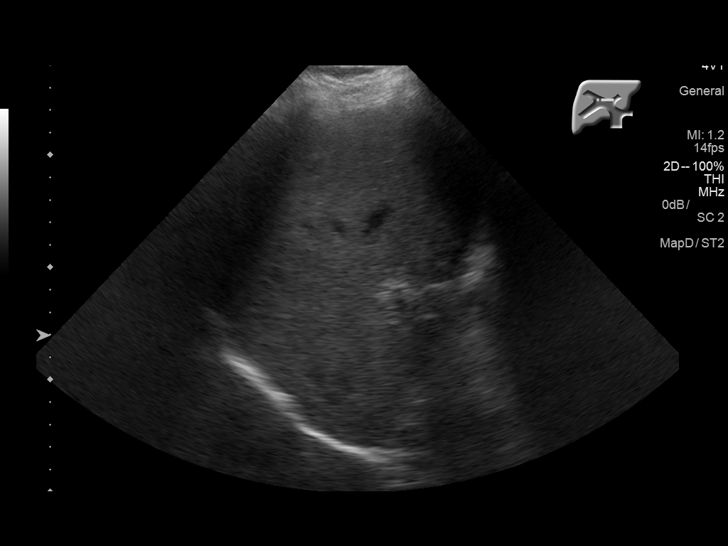
[im 44/95]
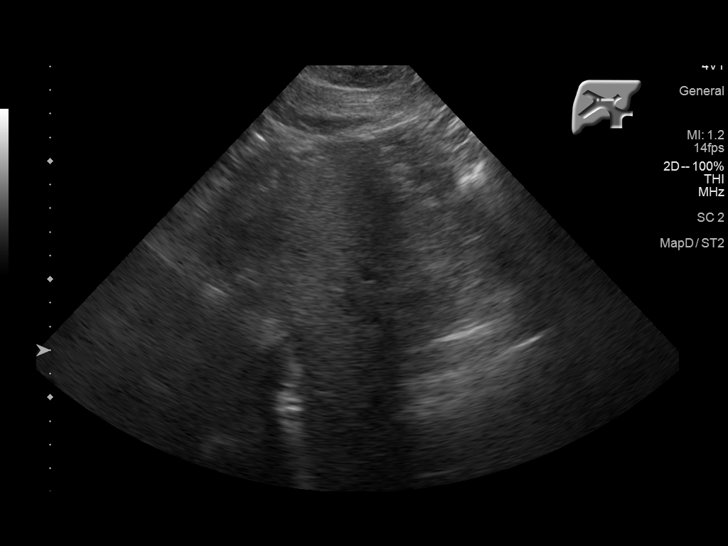
[im 51/95]
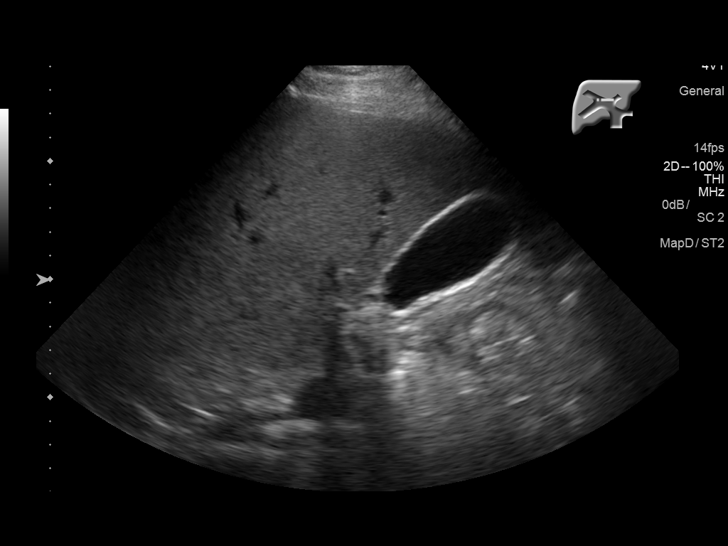
[im 59/95]
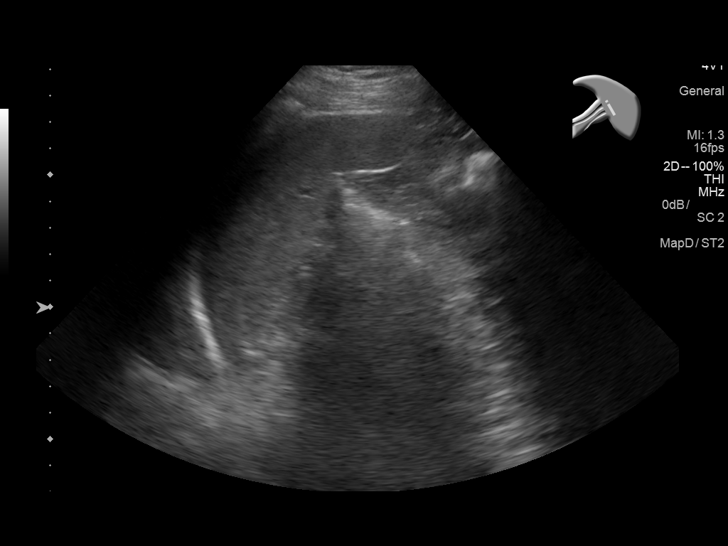
[im 63/95]
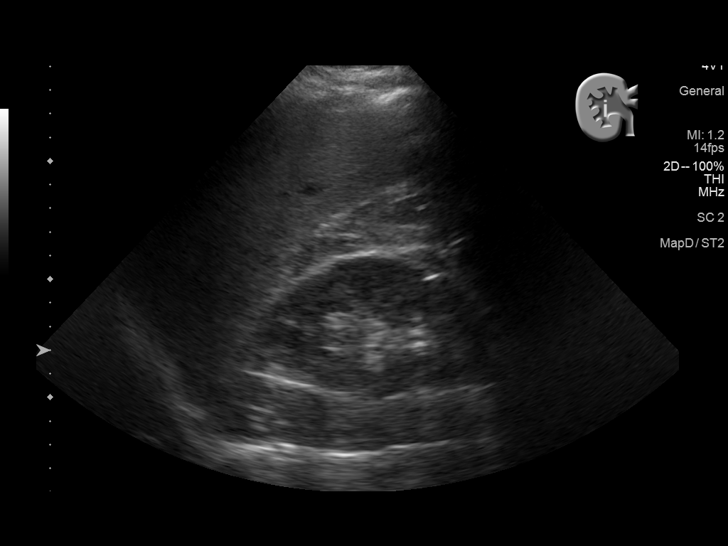
[im 71/95]
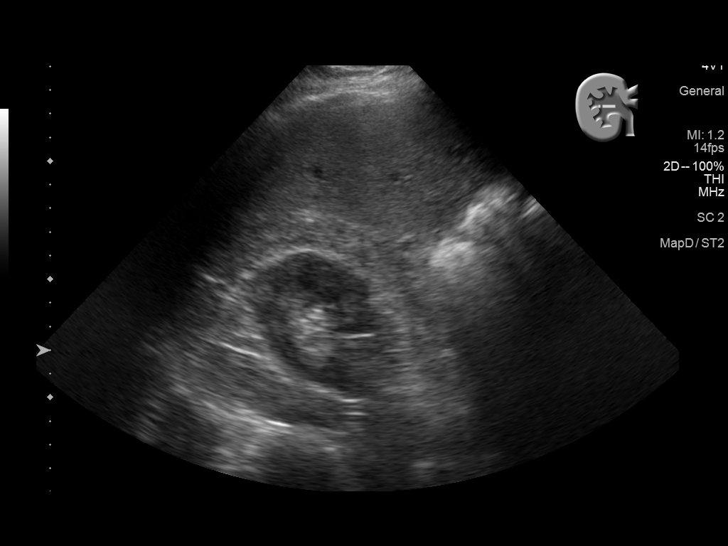
[im 79/95]
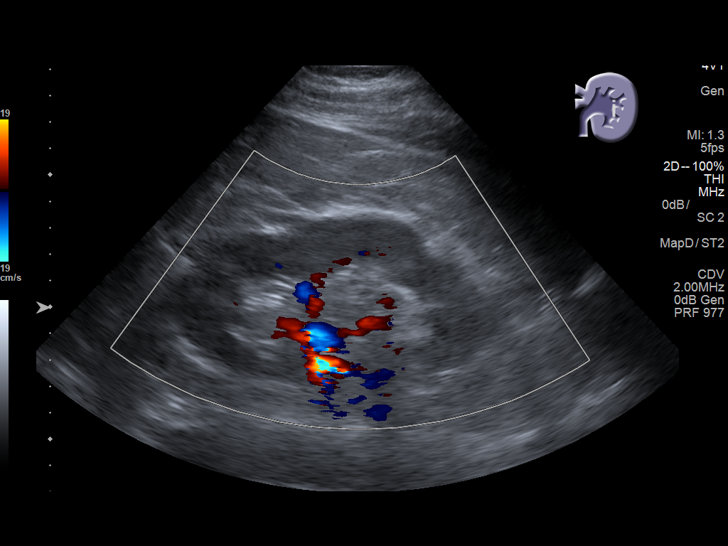
[im 87/95]
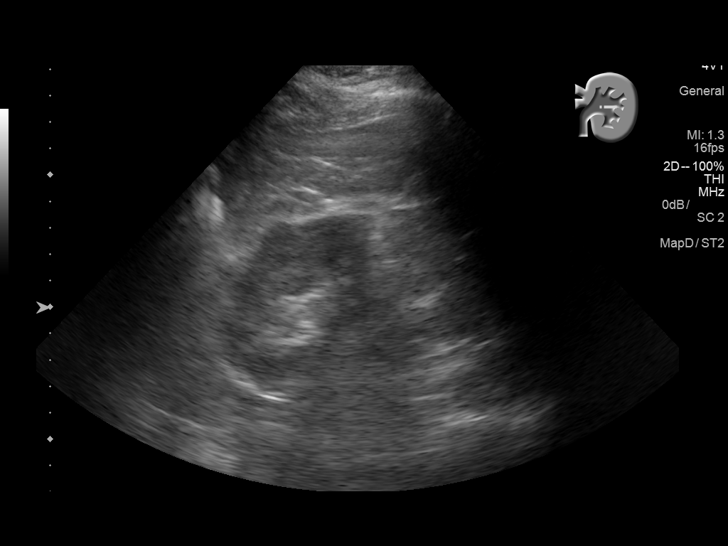
[im 95/95]
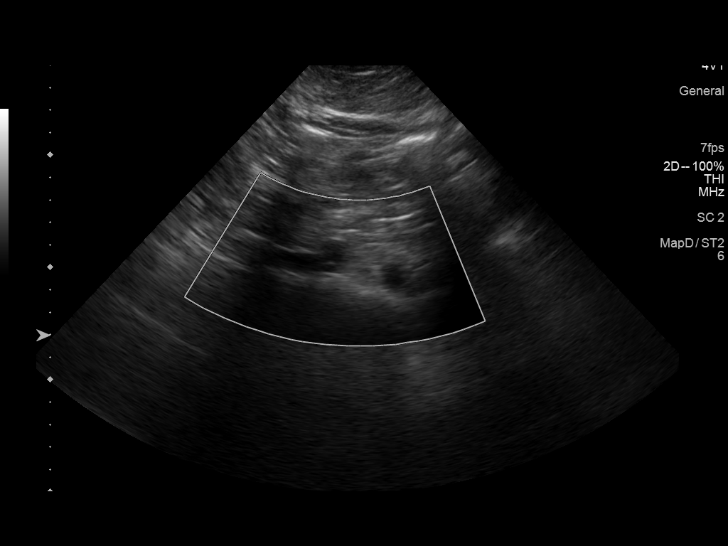

[14 of 25 positions shown; findings below may reference images not displayed]

FINDINGS: Gallbladder: No gallstones or wall thickening visualized. No
sonographic Murphy sign noted by sonographer.

Common bile duct: Diameter: 5.7 mm

Liver: No focal lesion identified. Within normal limits in
parenchymal echogenicity.

IVC: No abnormality visualized.

Pancreas: Visualized portion unremarkable.

Spleen: Size and appearance within normal limits.

Right Kidney: Length: 11.3 cm. Echogenicity within normal limits. No
mass or hydronephrosis visualized.

Left Kidney: Length: 11.4 cm. Echogenicity within normal limits. No
mass or hydronephrosis visualized.

Abdominal aorta: No aneurysm visualized.

Other findings: None.
IMPRESSION: Negative exam.

## 2015-08-20 ENCOUNTER — Other Ambulatory Visit: Payer: Self-pay | Admitting: Family Medicine

## 2015-08-23 ENCOUNTER — Telehealth: Payer: Self-pay | Admitting: Family Medicine

## 2015-08-23 DIAGNOSIS — E291 Testicular hypofunction: Secondary | ICD-10-CM

## 2015-08-23 MED ORDER — TESTOSTERONE 12.5 MG/ACT (1%) TD GEL: 2.0000 "application " | Freq: Every day | TRANSDERMAL | Status: DC

## 2015-08-23 NOTE — Telephone Encounter (Signed)
Pt request refill  Testosterone (VOGELXO PUMP) 12.5 MG/ACT (1%) GEL  [facility]  Pt states he has been trying to refill for a week, but not heard anything. (I do not see we have received) Pt will be out on Saturday.

## 2015-08-28 ENCOUNTER — Other Ambulatory Visit: Payer: Self-pay

## 2015-08-28 DIAGNOSIS — E291 Testicular hypofunction: Secondary | ICD-10-CM

## 2015-08-28 MED ORDER — TESTOSTERONE 12.5 MG/ACT (1%) TD GEL: 2.0000 "application " | Freq: Every day | TRANSDERMAL | Status: DC

## 2015-08-30 NOTE — Telephone Encounter (Signed)
Pt following up on this rx.  I see where it was sent on 6/13.

## 2015-09-10 DIAGNOSIS — M75101 Unspecified rotator cuff tear or rupture of right shoulder, not specified as traumatic: Secondary | ICD-10-CM | POA: Diagnosis not present

## 2015-09-22 DIAGNOSIS — M25511 Pain in right shoulder: Secondary | ICD-10-CM | POA: Diagnosis not present

## 2015-10-15 DIAGNOSIS — M19011 Primary osteoarthritis, right shoulder: Secondary | ICD-10-CM | POA: Diagnosis not present

## 2015-10-15 DIAGNOSIS — M7541 Impingement syndrome of right shoulder: Secondary | ICD-10-CM | POA: Diagnosis not present

## 2015-11-06 DIAGNOSIS — H43393 Other vitreous opacities, bilateral: Secondary | ICD-10-CM | POA: Diagnosis not present

## 2015-12-01 ENCOUNTER — Other Ambulatory Visit: Payer: Self-pay | Admitting: Family Medicine

## 2015-12-18 ENCOUNTER — Other Ambulatory Visit: Payer: Self-pay

## 2015-12-18 MED ORDER — DULOXETINE HCL 60 MG PO CPEP
60.0000 mg | ORAL_CAPSULE | Freq: Every day | ORAL | 5 refills | Status: DC
Start: 2015-12-18 — End: 2016-07-02

## 2015-12-31 ENCOUNTER — Ambulatory Visit: Payer: BLUE CROSS/BLUE SHIELD | Admitting: Family Medicine

## 2016-01-07 ENCOUNTER — Ambulatory Visit (INDEPENDENT_AMBULATORY_CARE_PROVIDER_SITE_OTHER): Payer: BLUE CROSS/BLUE SHIELD | Admitting: Family Medicine

## 2016-01-07 ENCOUNTER — Encounter: Payer: Self-pay | Admitting: Family Medicine

## 2016-01-07 VITALS — BP 118/80 | HR 69 | Temp 98.7°F | Wt 256.6 lb

## 2016-01-07 DIAGNOSIS — R739 Hyperglycemia, unspecified: Secondary | ICD-10-CM

## 2016-01-07 DIAGNOSIS — Z23 Encounter for immunization: Secondary | ICD-10-CM | POA: Diagnosis not present

## 2016-01-07 DIAGNOSIS — F330 Major depressive disorder, recurrent, mild: Secondary | ICD-10-CM

## 2016-01-07 DIAGNOSIS — E291 Testicular hypofunction: Secondary | ICD-10-CM

## 2016-01-07 DIAGNOSIS — E6609 Other obesity due to excess calories: Secondary | ICD-10-CM

## 2016-01-07 DIAGNOSIS — I10 Essential (primary) hypertension: Secondary | ICD-10-CM

## 2016-01-07 NOTE — Patient Instructions (Signed)
internet down at time of visit- handwritten avs given 

## 2016-01-07 NOTE — Assessment & Plan Note (Signed)
S: patient weight stable. Last cbg was 118.  A/P: we discussed links to weight and diabetes risk. Plan is to add a1c to next labs as well with 6 month CPe

## 2016-01-07 NOTE — Assessment & Plan Note (Signed)
S: controlled on lisinopril hctz 20-25mg  BP Readings from Last 3 Encounters:  01/07/16 118/80  06/25/15 122/68  05/14/15 (!) 142/84  A/P:Continue current medications.

## 2016-01-07 NOTE — Progress Notes (Addendum)
Subjective:  Jason Lopez is a 55 y.o. year old very pleasant male patient who presents for/with See problem oriented charting ROS- no fatigue. No SI/HI. Some right hip aching- thinks he tweaked this in last week. No chest pain or shortness of breath. No headache or blurry vision. .see any ROS included in HPI as well.   Past Medical History-  Patient Active Problem List   Diagnosis Date Noted  . Hyperglycemia 12/27/2014    Priority: Medium  . Obesity 04/17/2014    Priority: Medium  . Depression, major, recurrent, mild (HCC) 04/22/2010    Priority: Medium  . Hypogonadism in male 01/08/2010    Priority: Medium  . ERECTILE DYSFUNCTION, MILD 08/02/2007    Priority: Medium  . Hyperlipidemia 12/09/2006    Priority: Medium  . Essential hypertension 12/09/2006    Priority: Medium  . Seborrheic dermatitis 04/17/2014    Priority: Low  . PLANTAR FASCIITIS 01/21/2010    Priority: Low  . RADIAL NERVE INJURY 01/21/2010    Priority: Low  . Allergic rhinitis 12/09/2006    Priority: Low  . GERD 12/09/2006    Priority: Low  . History of colonic polyps 12/09/2006    Priority: Low  . RUQ abdominal pain 05/14/2015  . Tendinopathy of right rotator cuff 12/27/2014    Medications- reviewed and updated Current Outpatient Prescriptions  Medication Sig Dispense Refill  . DULoxetine (CYMBALTA) 60 MG capsule Take 1 capsule (60 mg total) by mouth daily. 30 capsule 5  . esomeprazole (NEXIUM) 20 MG capsule Take 40 mg by mouth daily at 12 noon. OTC    . fluticasone (FLONASE) 50 MCG/ACT nasal spray PLACE 2 SPRAYS INTO THE NOSE DAILY. 16 g 0  . lisinopril-hydrochlorothiazide (PRINZIDE,ZESTORETIC) 20-25 MG tablet TAKE 1 TABLET BY MOUTH DAILY. 90 tablet 1  . meloxicam (MOBIC) 15 MG tablet TAKE 1 TABLET BY MOUTH DAILY 30 tablet 2  . Multiple Vitamin (MULTIVITAMIN) tablet Take 1 tablet by mouth daily.      . pimecrolimus (ELIDEL) 1 % cream USE AS DIRECTED 30 g 6  . Testosterone (VOGELXO PUMP) 12.5 MG/ACT  (1%) GEL Place 2 application onto the skin daily. 75 g 3   No current facility-administered medications for this visit.     Objective: BP 118/80 (BP Location: Left Arm, Patient Position: Sitting, Cuff Size: Large)   Pulse 69   Temp 98.7 F (37.1 C) (Oral)   Wt 256 lb 9.6 oz (116.4 kg)   SpO2 94%   BMI 32.95 kg/m  Gen: NAD, resting comfortably CV: RRR no murmurs rubs or gallops Lungs: CTAB no crackles, wheeze, rhonchi Abdomen: obese No edema  Assessment/Plan:  Essential hypertension S: controlled on lisinopril hctz 20-25mg  BP Readings from Last 3 Encounters:  01/07/16 118/80  06/25/15 122/68  05/14/15 (!) 142/84  A/P:Continue current medications.    Obesity Encouraged need for healthy eating, regular exercise, weight loss.  Weight largely stable since last visit but goal was weight loss. He states he gained 10 lbs and has since lost that with low carb diet over last month. He has really cut down on candy, potatoes, other sweets. Got motivated when realized clothes were not fitting him as well anymore- now fitting better.   Depression, major, recurrent, mild (HCC) S: reasonable control on cymbalta 60mg  with phq2 of 0. He worris about winter months being harder.  A/P: due to his concern of weight loss- discussed if we could treat with counseling would be ideal in winter months. He is going to  consider seeing [facility] if he starts to get the winter blues and also discussed [email or web address] as well.    Hyperglycemia S: patient weight stable. Last cbg was 118.  A/P: we discussed links to weight and diabetes risk. Plan is to add a1c to next labs as well with 6 month CPe   Hypogonadism in male S: severe fatigue off testosterone has not recurred while on medication- he remains onlisted dose of testosterone A/P:considered updating testosterone today but patient prefers to add on with physical labs- wrote in on his handwritten AVS as system down today.   6  months CPE. a1c with hyperglycemia. Testosterone with hypogonadism  Orders Placed This Encounter  Procedures  . Flu Vaccine QUAD 36+ mos IM   Epic down at time of visit- unaware in need of Tdap- can do at CPE  The duration of face-to-face time during this visit was greater than 25 minutes. Greater than 50% of this time was spent in counseling on lifestyle choices/need for weight loss, discussion of cbg andrisk for diabetes, discussion of needed labwork, discussion of options for depression and SE of medicines.   Return precautions advised.  Tana ConchStephen Valeria Boza, MD

## 2016-01-07 NOTE — Addendum Note (Signed)
Addended by: Shelva MajesticHUNTER, Xylia Scherger O on: 01/07/2016 09:21 PM   Modules accepted: Orders

## 2016-01-07 NOTE — Assessment & Plan Note (Signed)
S: reasonable control on cymbalta 60mg  with phq2 of 0. He worris about winter months being harder.  A/P: due to his concern of weight loss- discussed if we could treat with counseling would be ideal in winter months. He is going to consider seeing [facility] if he starts to get the winter blues and also discussed [email or web address] as well.

## 2016-01-07 NOTE — Assessment & Plan Note (Signed)
S: severe fatigue off testosterone has not recurred while on medication- he remains onlisted dose of testosterone A/P:considered updating testosterone today but patient prefers to add on with physical labs- wrote in on his handwritten AVS as system down today.

## 2016-01-07 NOTE — Progress Notes (Signed)
Pre visit review using our clinic review tool, if applicable. No additional management support is needed unless otherwise documented below in the visit note.[id number]

## 2016-01-07 NOTE — Assessment & Plan Note (Signed)
Encouraged need for healthy eating, regular exercise, weight loss.  Weight largely stable since last visit but goal was weight loss. He states he gained 10 lbs and has since lost that with low carb diet over last month. He has really cut down on candy, potatoes, other sweets. Got motivated when realized clothes were not fitting him as well anymore- now fitting better.

## 2016-02-18 DIAGNOSIS — M7541 Impingement syndrome of right shoulder: Secondary | ICD-10-CM | POA: Diagnosis not present

## 2016-03-20 ENCOUNTER — Encounter: Payer: Self-pay | Admitting: Family Medicine

## 2016-03-20 ENCOUNTER — Telehealth: Payer: Self-pay

## 2016-03-20 NOTE — Telephone Encounter (Signed)
Called patient and left a voicemail message asking for a return phone call. I need to confirm that patient needs a refill on his testosterone.

## 2016-03-21 ENCOUNTER — Encounter: Payer: Self-pay | Admitting: Adult Health

## 2016-03-21 ENCOUNTER — Ambulatory Visit (INDEPENDENT_AMBULATORY_CARE_PROVIDER_SITE_OTHER): Payer: BLUE CROSS/BLUE SHIELD | Admitting: Adult Health

## 2016-03-21 VITALS — BP 152/82 | Temp 98.4°F | Ht 74.0 in | Wt 250.0 lb

## 2016-03-21 DIAGNOSIS — J0100 Acute maxillary sinusitis, unspecified: Secondary | ICD-10-CM | POA: Diagnosis not present

## 2016-03-21 MED ORDER — DOXYCYCLINE HYCLATE 100 MG PO CAPS: 100.0000 mg | ORAL_CAPSULE | Freq: Two times a day (BID) | ORAL | 0 refills | Status: DC

## 2016-03-21 NOTE — Progress Notes (Signed)
Subjective:    Patient ID: Jason Lopez, male    DOB: 25-Aug-1960, 56 y.o.   MRN: [id number]  HPI  56 year old male who  has a past medical history of COLONIC POLYPS, HX OF (12/09/2006); ERECTILE DYSFUNCTION, MILD (08/02/2007); GERD (12/09/2006); HYPERLIPIDEMIA (12/09/2006); HYPERTENSION (12/09/2006); HYPOGONADISM (01/08/2010); PLANTAR FASCIITIS (01/21/2010); and RADIAL NERVE INJURY (01/21/2010). He is a patient of Dr. Durene Cal, who I am seeing today for an acute issue of sinusitis type symptoms. He reports that his symptoms started about 2.5 weeks ago. His symptoms include dizziness, ear pain, sinus pain and pressure, sore throat ( resolved), chest congestion.   He has been using Dayquil and thera flu, which he reports helped with the sinus congestion.   He denies, feeling ill, fevers, n/v/d   Review of Systems See HPI   Past Medical History:  Diagnosis Date  . COLONIC POLYPS, HX OF 12/09/2006  . ERECTILE DYSFUNCTION, MILD 08/02/2007  . GERD 12/09/2006  . HYPERLIPIDEMIA 12/09/2006  . HYPERTENSION 12/09/2006  . HYPOGONADISM 01/08/2010  . PLANTAR FASCIITIS 01/21/2010  . RADIAL NERVE INJURY 01/21/2010    Social History   Social History  . Marital status: Single    Spouse name: N/A  . Number of children: N/A  . Years of education: N/A   Occupational History  . OWNER Film/video editor    Social History Main Topics  . Smoking status: Never Smoker  . Smokeless tobacco: Not on file  . Alcohol use 0.0 - 3.0 oz/week  . Drug use: No  . Sexual activity: Not on file   Other Topics Concern  . Not on file   Social History Narrative   Lives alone with mom living at him at times.       Work as a Firefighter: working on house, garden, shop    Past Surgical History:  Procedure Laterality Date  . COLONOSCOPY  2008, 2011    Family History  Problem Relation Age of Onset  . Breast cancer Mother   . Pancreatic cancer Maternal Grandmother   . Diabetes Father     . Hypertension Father     mother  . Hyperlipidemia Father   . Lung cancer Maternal Grandfather     smoker    No Known Allergies  Current Outpatient Prescriptions on File Prior to Visit  Medication Sig Dispense Refill  . DULoxetine (CYMBALTA) 60 MG capsule Take 1 capsule (60 mg total) by mouth daily. 30 capsule 5  . esomeprazole (NEXIUM) 20 MG capsule Take 40 mg by mouth daily at 12 noon. OTC    . lisinopril-hydrochlorothiazide (PRINZIDE,ZESTORETIC) 20-25 MG tablet TAKE 1 TABLET BY MOUTH DAILY. 90 tablet 1  . Multiple Vitamin (MULTIVITAMIN) tablet Take 1 tablet by mouth daily.      . pimecrolimus (ELIDEL) 1 % cream USE AS DIRECTED 30 g 6  . Testosterone (VOGELXO PUMP) 12.5 MG/ACT (1%) GEL Place 2 application onto the skin daily. 75 g 3  . fluticasone (FLONASE) 50 MCG/ACT nasal spray PLACE 2 SPRAYS INTO THE NOSE DAILY. (Patient not taking: Reported on 03/21/2016) 16 g 0   No current facility-administered medications on file prior to visit.     BP (!) 152/82   Temp 98.4 F (36.9 C) (Oral)   Ht 6\' 2"  (1.88 m)   Wt 250 lb (113.4 kg)   BMI 32.10 kg/m       Objective:   Physical Exam  Constitutional: He is oriented  to person, place, and time. He appears well-developed and well-nourished. No distress.  HENT:  Head: Normocephalic and atraumatic.  Right Ear: Hearing, external ear and ear canal normal. Tympanic membrane is bulging. Tympanic membrane is not erythematous.  Left Ear: Hearing, external ear and ear canal normal. Tympanic membrane is bulging. Tympanic membrane is not erythematous.  Nose: Mucosal edema and rhinorrhea present. Right sinus exhibits maxillary sinus tenderness. Left sinus exhibits maxillary sinus tenderness. Left sinus exhibits no frontal sinus tenderness.  Mouth/Throat: Uvula is midline, oropharynx is clear and moist and mucous membranes are normal. No oropharyngeal exudate, posterior oropharyngeal edema, posterior oropharyngeal erythema or tonsillar abscesses.   Eyes: Conjunctivae and EOM are normal. Pupils are equal, round, and reactive to light. Right eye exhibits no discharge. Left eye exhibits no discharge. No scleral icterus.  Neck: Normal range of motion. Neck supple. No thyromegaly present.  Cardiovascular: Normal rate, regular rhythm, normal heart sounds and intact distal pulses.  Exam reveals no gallop and no friction rub.   No murmur heard. Pulmonary/Chest: Effort normal and breath sounds normal. No respiratory distress. He has no wheezes. He has no rales. He exhibits no tenderness.  Musculoskeletal: Normal range of motion. He exhibits no edema, tenderness or deformity.  Lymphadenopathy:    He has no cervical adenopathy.  Neurological: He is alert and oriented to person, place, and time. He has normal reflexes. He displays normal reflexes. No cranial nerve deficit. He exhibits normal muscle tone. Coordination normal.  Skin: Skin is warm and dry. No rash noted. He is not diaphoretic. No erythema. No pallor.  Psychiatric: He has a normal mood and affect. His behavior is normal. Judgment and thought content normal.  Vitals reviewed.     Assessment & Plan:  1. Acute non-recurrent maxillary sinusitis - doxycycline (VIBRAMYCIN) 100 MG capsule; Take 1 capsule (100 mg total) by mouth 2 (two) times daily.  Dispense: 14 capsule; Refill: 0 - Flonase  - Rest and stay hydrated.   Shirline Freesory Devri Kreher, NP

## 2016-03-24 DIAGNOSIS — H43393 Other vitreous opacities, bilateral: Secondary | ICD-10-CM | POA: Diagnosis not present

## 2016-03-25 ENCOUNTER — Other Ambulatory Visit: Payer: Self-pay

## 2016-03-25 DIAGNOSIS — E291 Testicular hypofunction: Secondary | ICD-10-CM

## 2016-03-25 MED ORDER — TESTOSTERONE 12.5 MG/ACT (1%) TD GEL: 2.0000 "application " | Freq: Every day | TRANSDERMAL | 3 refills | Status: DC

## 2016-06-02 ENCOUNTER — Other Ambulatory Visit: Payer: Self-pay | Admitting: Family Medicine

## 2016-06-04 DIAGNOSIS — R635 Abnormal weight gain: Secondary | ICD-10-CM | POA: Diagnosis not present

## 2016-06-04 DIAGNOSIS — E291 Testicular hypofunction: Secondary | ICD-10-CM | POA: Diagnosis not present

## 2016-06-05 DIAGNOSIS — R7303 Prediabetes: Secondary | ICD-10-CM | POA: Diagnosis not present

## 2016-06-05 DIAGNOSIS — E8881 Metabolic syndrome: Secondary | ICD-10-CM | POA: Diagnosis not present

## 2016-06-05 DIAGNOSIS — E538 Deficiency of other specified B group vitamins: Secondary | ICD-10-CM | POA: Diagnosis not present

## 2016-06-05 DIAGNOSIS — E782 Mixed hyperlipidemia: Secondary | ICD-10-CM | POA: Diagnosis not present

## 2016-06-05 DIAGNOSIS — I10 Essential (primary) hypertension: Secondary | ICD-10-CM | POA: Diagnosis not present

## 2016-06-13 DIAGNOSIS — E538 Deficiency of other specified B group vitamins: Secondary | ICD-10-CM | POA: Diagnosis not present

## 2016-06-13 DIAGNOSIS — E8881 Metabolic syndrome: Secondary | ICD-10-CM | POA: Diagnosis not present

## 2016-06-13 DIAGNOSIS — E291 Testicular hypofunction: Secondary | ICD-10-CM | POA: Diagnosis not present

## 2016-06-13 DIAGNOSIS — R7303 Prediabetes: Secondary | ICD-10-CM | POA: Diagnosis not present

## 2016-06-20 DIAGNOSIS — E538 Deficiency of other specified B group vitamins: Secondary | ICD-10-CM | POA: Diagnosis not present

## 2016-06-20 DIAGNOSIS — E6609 Other obesity due to excess calories: Secondary | ICD-10-CM | POA: Diagnosis not present

## 2016-06-20 DIAGNOSIS — E291 Testicular hypofunction: Secondary | ICD-10-CM | POA: Diagnosis not present

## 2016-06-20 DIAGNOSIS — E8881 Metabolic syndrome: Secondary | ICD-10-CM | POA: Diagnosis not present

## 2016-06-20 DIAGNOSIS — E782 Mixed hyperlipidemia: Secondary | ICD-10-CM | POA: Diagnosis not present

## 2016-06-26 DIAGNOSIS — E782 Mixed hyperlipidemia: Secondary | ICD-10-CM | POA: Diagnosis not present

## 2016-06-26 DIAGNOSIS — E538 Deficiency of other specified B group vitamins: Secondary | ICD-10-CM | POA: Diagnosis not present

## 2016-06-26 DIAGNOSIS — R7303 Prediabetes: Secondary | ICD-10-CM | POA: Diagnosis not present

## 2016-06-26 DIAGNOSIS — E291 Testicular hypofunction: Secondary | ICD-10-CM | POA: Diagnosis not present

## 2016-07-02 ENCOUNTER — Other Ambulatory Visit: Payer: Self-pay | Admitting: Family Medicine

## 2016-07-04 DIAGNOSIS — E782 Mixed hyperlipidemia: Secondary | ICD-10-CM | POA: Diagnosis not present

## 2016-07-04 DIAGNOSIS — E538 Deficiency of other specified B group vitamins: Secondary | ICD-10-CM | POA: Diagnosis not present

## 2016-07-04 DIAGNOSIS — R7303 Prediabetes: Secondary | ICD-10-CM | POA: Diagnosis not present

## 2016-07-04 DIAGNOSIS — E291 Testicular hypofunction: Secondary | ICD-10-CM | POA: Diagnosis not present

## 2016-07-07 DIAGNOSIS — R635 Abnormal weight gain: Secondary | ICD-10-CM | POA: Diagnosis not present

## 2016-07-07 DIAGNOSIS — E291 Testicular hypofunction: Secondary | ICD-10-CM | POA: Diagnosis not present

## 2016-07-11 DIAGNOSIS — E8881 Metabolic syndrome: Secondary | ICD-10-CM | POA: Diagnosis not present

## 2016-07-11 DIAGNOSIS — R7303 Prediabetes: Secondary | ICD-10-CM | POA: Diagnosis not present

## 2016-07-11 DIAGNOSIS — E291 Testicular hypofunction: Secondary | ICD-10-CM | POA: Diagnosis not present

## 2016-07-11 DIAGNOSIS — E538 Deficiency of other specified B group vitamins: Secondary | ICD-10-CM | POA: Diagnosis not present

## 2016-07-18 DIAGNOSIS — E538 Deficiency of other specified B group vitamins: Secondary | ICD-10-CM | POA: Diagnosis not present

## 2016-07-18 DIAGNOSIS — E6609 Other obesity due to excess calories: Secondary | ICD-10-CM | POA: Diagnosis not present

## 2016-07-18 DIAGNOSIS — R7303 Prediabetes: Secondary | ICD-10-CM | POA: Diagnosis not present

## 2016-07-18 DIAGNOSIS — E782 Mixed hyperlipidemia: Secondary | ICD-10-CM | POA: Diagnosis not present

## 2016-07-25 DIAGNOSIS — E8881 Metabolic syndrome: Secondary | ICD-10-CM | POA: Diagnosis not present

## 2016-07-25 DIAGNOSIS — R7303 Prediabetes: Secondary | ICD-10-CM | POA: Diagnosis not present

## 2016-07-25 DIAGNOSIS — E291 Testicular hypofunction: Secondary | ICD-10-CM | POA: Diagnosis not present

## 2016-07-25 DIAGNOSIS — E6609 Other obesity due to excess calories: Secondary | ICD-10-CM | POA: Diagnosis not present

## 2016-08-01 DIAGNOSIS — E782 Mixed hyperlipidemia: Secondary | ICD-10-CM | POA: Diagnosis not present

## 2016-08-01 DIAGNOSIS — E291 Testicular hypofunction: Secondary | ICD-10-CM | POA: Diagnosis not present

## 2016-08-01 DIAGNOSIS — E8881 Metabolic syndrome: Secondary | ICD-10-CM | POA: Diagnosis not present

## 2016-08-01 DIAGNOSIS — E6609 Other obesity due to excess calories: Secondary | ICD-10-CM | POA: Diagnosis not present

## 2016-08-08 DIAGNOSIS — E8881 Metabolic syndrome: Secondary | ICD-10-CM | POA: Diagnosis not present

## 2016-08-08 DIAGNOSIS — E6609 Other obesity due to excess calories: Secondary | ICD-10-CM | POA: Diagnosis not present

## 2016-08-08 DIAGNOSIS — E782 Mixed hyperlipidemia: Secondary | ICD-10-CM | POA: Diagnosis not present

## 2016-08-08 DIAGNOSIS — I10 Essential (primary) hypertension: Secondary | ICD-10-CM | POA: Diagnosis not present

## 2016-08-15 DIAGNOSIS — E782 Mixed hyperlipidemia: Secondary | ICD-10-CM | POA: Diagnosis not present

## 2016-08-15 DIAGNOSIS — E6609 Other obesity due to excess calories: Secondary | ICD-10-CM | POA: Diagnosis not present

## 2016-08-15 DIAGNOSIS — E291 Testicular hypofunction: Secondary | ICD-10-CM | POA: Diagnosis not present

## 2016-08-15 DIAGNOSIS — R7303 Prediabetes: Secondary | ICD-10-CM | POA: Diagnosis not present

## 2016-08-22 DIAGNOSIS — E291 Testicular hypofunction: Secondary | ICD-10-CM | POA: Diagnosis not present

## 2016-08-22 DIAGNOSIS — K219 Gastro-esophageal reflux disease without esophagitis: Secondary | ICD-10-CM | POA: Diagnosis not present

## 2016-08-22 DIAGNOSIS — E6609 Other obesity due to excess calories: Secondary | ICD-10-CM | POA: Diagnosis not present

## 2016-08-22 DIAGNOSIS — I10 Essential (primary) hypertension: Secondary | ICD-10-CM | POA: Diagnosis not present

## 2016-08-22 DIAGNOSIS — E782 Mixed hyperlipidemia: Secondary | ICD-10-CM | POA: Diagnosis not present

## 2016-08-28 DIAGNOSIS — E6609 Other obesity due to excess calories: Secondary | ICD-10-CM | POA: Diagnosis not present

## 2016-08-28 DIAGNOSIS — E8881 Metabolic syndrome: Secondary | ICD-10-CM | POA: Diagnosis not present

## 2016-08-28 DIAGNOSIS — E291 Testicular hypofunction: Secondary | ICD-10-CM | POA: Diagnosis not present

## 2016-09-03 ENCOUNTER — Other Ambulatory Visit: Payer: BLUE CROSS/BLUE SHIELD

## 2016-09-05 DIAGNOSIS — I10 Essential (primary) hypertension: Secondary | ICD-10-CM | POA: Diagnosis not present

## 2016-09-05 DIAGNOSIS — E6609 Other obesity due to excess calories: Secondary | ICD-10-CM | POA: Diagnosis not present

## 2016-09-05 DIAGNOSIS — E782 Mixed hyperlipidemia: Secondary | ICD-10-CM | POA: Diagnosis not present

## 2016-09-05 DIAGNOSIS — E291 Testicular hypofunction: Secondary | ICD-10-CM | POA: Diagnosis not present

## 2016-09-08 ENCOUNTER — Encounter: Payer: BLUE CROSS/BLUE SHIELD | Admitting: Family Medicine

## 2016-09-12 ENCOUNTER — Telehealth: Payer: Self-pay | Admitting: Family Medicine

## 2016-09-12 ENCOUNTER — Other Ambulatory Visit: Payer: Self-pay

## 2016-09-12 DIAGNOSIS — E291 Testicular hypofunction: Secondary | ICD-10-CM | POA: Diagnosis not present

## 2016-09-12 MED ORDER — PIMECROLIMUS 1 % EX CREA: TOPICAL_CREAM | CUTANEOUS | 6 refills | Status: DC

## 2016-09-12 NOTE — Telephone Encounter (Signed)
°  Pt call to ask if the below med can be refill it is a cream that he uses for scalely skin     Pt request refill of the following:  ELIQUIL CREAM    [facility]

## 2016-09-12 NOTE — Telephone Encounter (Signed)
Rx has been sent in.  Thanks!

## 2016-09-19 DIAGNOSIS — E8881 Metabolic syndrome: Secondary | ICD-10-CM | POA: Diagnosis not present

## 2016-09-19 DIAGNOSIS — E6609 Other obesity due to excess calories: Secondary | ICD-10-CM | POA: Diagnosis not present

## 2016-09-19 DIAGNOSIS — E291 Testicular hypofunction: Secondary | ICD-10-CM | POA: Diagnosis not present

## 2016-09-19 DIAGNOSIS — K219 Gastro-esophageal reflux disease without esophagitis: Secondary | ICD-10-CM | POA: Diagnosis not present

## 2016-09-22 ENCOUNTER — Telehealth: Payer: Self-pay

## 2016-09-22 ENCOUNTER — Other Ambulatory Visit: Payer: Self-pay | Admitting: Family Medicine

## 2016-09-22 MED ORDER — DULOXETINE HCL 60 MG PO CPEP
60.0000 mg | ORAL_CAPSULE | Freq: Every day | ORAL | 1 refills | Status: DC
Start: 2016-09-22 — End: 2016-09-24

## 2016-09-22 NOTE — Telephone Encounter (Signed)
Discuss at visit.

## 2016-09-22 NOTE — Telephone Encounter (Signed)
PA for Elidel is denied. Patient has to try generic tacrolimus ointment before Elidel will be covered.

## 2016-09-22 NOTE — Telephone Encounter (Signed)
Pharmacy is requesting a 90 day rx.  Please advise.  Thanks!!

## 2016-09-24 ENCOUNTER — Other Ambulatory Visit: Payer: Self-pay

## 2016-09-24 ENCOUNTER — Encounter: Payer: Self-pay | Admitting: Family Medicine

## 2016-09-24 ENCOUNTER — Ambulatory Visit (INDEPENDENT_AMBULATORY_CARE_PROVIDER_SITE_OTHER): Payer: BLUE CROSS/BLUE SHIELD | Admitting: Family Medicine

## 2016-09-24 VITALS — BP 136/84 | HR 71 | Temp 99.0°F | Ht 74.75 in | Wt 237.8 lb

## 2016-09-24 DIAGNOSIS — L219 Seborrheic dermatitis, unspecified: Secondary | ICD-10-CM | POA: Diagnosis not present

## 2016-09-24 DIAGNOSIS — E291 Testicular hypofunction: Secondary | ICD-10-CM | POA: Diagnosis not present

## 2016-09-24 DIAGNOSIS — I10 Essential (primary) hypertension: Secondary | ICD-10-CM | POA: Diagnosis not present

## 2016-09-24 DIAGNOSIS — Z0000 Encounter for general adult medical examination without abnormal findings: Secondary | ICD-10-CM | POA: Diagnosis not present

## 2016-09-24 DIAGNOSIS — Z125 Encounter for screening for malignant neoplasm of prostate: Secondary | ICD-10-CM

## 2016-09-24 DIAGNOSIS — F330 Major depressive disorder, recurrent, mild: Secondary | ICD-10-CM | POA: Diagnosis not present

## 2016-09-24 DIAGNOSIS — Z23 Encounter for immunization: Secondary | ICD-10-CM

## 2016-09-24 DIAGNOSIS — R739 Hyperglycemia, unspecified: Secondary | ICD-10-CM

## 2016-09-24 DIAGNOSIS — E785 Hyperlipidemia, unspecified: Secondary | ICD-10-CM | POA: Diagnosis not present

## 2016-09-24 LAB — COMPREHENSIVE METABOLIC PANEL
ALT: 27 U/L (ref 0–53)
AST: 28 U/L (ref 0–37)
Albumin: 4.3 g/dL (ref 3.5–5.2)
Alkaline Phosphatase: 67 U/L (ref 39–117)
BILIRUBIN TOTAL: 0.6 mg/dL (ref 0.2–1.2)
BUN: 23 mg/dL (ref 6–23)
CO2: 34 meq/L — AB (ref 19–32)
CREATININE: 0.98 mg/dL (ref 0.40–1.50)
Calcium: 9.5 mg/dL (ref 8.4–10.5)
Chloride: 98 mEq/L (ref 96–112)
GFR: 84.6 mL/min (ref >60.00)
GLUCOSE: 104 mg/dL — AB (ref 70–99)
Potassium: 4 mEq/L (ref 3.5–5.1)
SODIUM: 139 meq/L (ref 135–145)
Total Protein: 6.9 g/dL (ref 6.0–8.3)

## 2016-09-24 LAB — CBC
HCT: 49.7 % (ref 39.0–52.0)
Hemoglobin: 16.2 g/dL (ref 13.0–17.0)
MCHC: 32.7 g/dL (ref 30.0–36.0)
MCV: 81.4 fl (ref 78.0–100.0)
Platelets: 200 10*3/uL (ref 150.0–400.0)
RBC: 6.11 Mil/uL — ABNORMAL HIGH (ref 4.22–5.81)
RDW: 14.6 % (ref 11.5–15.5)
WBC: 6.4 10*3/uL (ref 4.0–10.5)

## 2016-09-24 LAB — LIPID PANEL
CHOL/HDL RATIO: 4
Cholesterol: 184 mg/dL (ref 0–200)
HDL: 47.7 mg/dL (ref >39.00)
LDL Cholesterol: 116 mg/dL — ABNORMAL HIGH (ref 0–99)
NONHDL: 136.23
Triglycerides: 100 mg/dL (ref 0.0–149.0)
VLDL: 20 mg/dL (ref 0.0–40.0)

## 2016-09-24 LAB — TESTOSTERONE: TESTOSTERONE: 814.68 ng/dL (ref 300.00–890.00)

## 2016-09-24 LAB — PSA: PSA: 0.74 ng/mL (ref 0.10–4.00)

## 2016-09-24 LAB — HEMOGLOBIN A1C: HEMOGLOBIN A1C: 5.9 % (ref 4.6–6.5)

## 2016-09-24 MED ORDER — DULOXETINE HCL 60 MG PO CPEP: 60.0000 mg | ORAL_CAPSULE | Freq: Every day | ORAL | 1 refills | Status: DC

## 2016-09-24 MED ORDER — TACROLIMUS 0.1 % EX OINT: TOPICAL_OINTMENT | Freq: Two times a day (BID) | CUTANEOUS | 5 refills | Status: DC

## 2016-09-24 NOTE — Patient Instructions (Addendum)
Tdap today  Shingrix #1 today. Repeat injection in 2-3 months. Schedule this before you leave.   Great job on weight loss- would love to see you have at least another 10 lbs off by 6 month follow up  Trial tacrolimus instead of elidel for the seborrheic dermatotis

## 2016-09-24 NOTE — Assessment & Plan Note (Signed)
S: poorly controlled despite being on vytorin in the past- risk was below 7.5 % off medicine and we have been working on weight loss. No myalgias.  Lab Results  Component Value Date   CHOL 238 (H) 12/11/2014   HDL 49.30 12/11/2014   LDLCALC 160 (H) 12/11/2014   LDLDIRECT 141.8 08/02/2012   TRIG 144.0 12/11/2014   CHOLHDL 5 12/11/2014   A/P: update cholesterol today and recalculate risk

## 2016-09-24 NOTE — Progress Notes (Signed)
Phone: [phone]  Subjective:  Patient presents today for their annual physical. Chief complaint-noted.   See problem oriented charting- ROS- full  review of systems was completed and negative except for: more energy since being on testosterone injections  The following were reviewed and entered/updated in epic: Past Medical History:  Diagnosis Date  . COLONIC POLYPS, HX OF 12/09/2006  . ERECTILE DYSFUNCTION, MILD 08/02/2007  . GERD 12/09/2006  . HYPERLIPIDEMIA 12/09/2006  . HYPERTENSION 12/09/2006  . HYPOGONADISM 01/08/2010  . PLANTAR FASCIITIS 01/21/2010  . RADIAL NERVE INJURY 01/21/2010   Patient Active Problem List   Diagnosis Date Noted  . Hyperglycemia 12/27/2014    Priority: Medium  . Obesity 04/17/2014    Priority: Medium  . Depression, major, recurrent, mild (HCC) 04/22/2010    Priority: Medium  . Hypogonadism in male 01/08/2010    Priority: Medium  . ERECTILE DYSFUNCTION, MILD 08/02/2007    Priority: Medium  . Hyperlipidemia 12/09/2006    Priority: Medium  . Essential hypertension 12/09/2006    Priority: Medium  . Seborrheic dermatitis 04/17/2014    Priority: Low  . PLANTAR FASCIITIS 01/21/2010    Priority: Low  . RADIAL NERVE INJURY 01/21/2010    Priority: Low  . Allergic rhinitis 12/09/2006    Priority: Low  . GERD 12/09/2006    Priority: Low  . History of colonic polyps 12/09/2006    Priority: Low  . RUQ abdominal pain 05/14/2015  . Tendinopathy of right rotator cuff 12/27/2014   Past Surgical History:  Procedure Laterality Date  . COLONOSCOPY  2008, 2011    Family History  Problem Relation Age of Onset  . Breast cancer Mother   . Pancreatic cancer Maternal Grandmother   . Diabetes Father   . Hypertension Father        mother  . Hyperlipidemia Father   . Lung cancer Maternal Grandfather        smoker    Medications- reviewed and updated Current Outpatient Prescriptions  Medication Sig Dispense Refill  . DULoxetine (CYMBALTA) 60 MG  capsule Take 1 capsule (60 mg total) by mouth daily. 90 capsule 1  . esomeprazole (NEXIUM) 20 MG capsule Take 40 mg by mouth daily at 12 noon. OTC    . fluticasone (FLONASE) 50 MCG/ACT nasal spray PLACE 2 SPRAYS INTO THE NOSE DAILY. 16 g 0  . lisinopril-hydrochlorothiazide (PRINZIDE,ZESTORETIC) 20-25 MG tablet TAKE 1 TABLET BY MOUTH DAILY. 90 tablet 1  . Multiple Vitamin (MULTIVITAMIN) tablet Take 1 tablet by mouth daily.      . pimecrolimus (ELIDEL) 1 % cream USE AS DIRECTED 30 g 6  . tacrolimus (PROTOPIC) 0.1 % ointment Apply topically 2 (two) times daily. As needed for seborrheic dermatitis 100 g 5   No current facility-administered medications for this visit.     Allergies-reviewed and updated No Known Allergies  Social History   Social History  . Marital status: Single    Spouse name: N/A  . Number of children: N/A  . Years of education: N/A   Occupational History  . OWNER Film/video editor    Social History Main Topics  . Smoking status: Never Smoker  . Smokeless tobacco: Never Used  . Alcohol use 0.0 - 3.0 oz/week  . Drug use: No  . Sexual activity: Not Asked   Other Topics Concern  . None   Social History Narrative   Lives alone with mom living at him at times.       Work as a Social worker  Hobbies: working on house, garden, shop    Objective: BP 136/84 (BP Location: Left Arm, Patient Position: Sitting, Cuff Size: Large)   Pulse 71   Temp 99 F (37.2 C) (Oral)   Ht 6' 2.75" (1.899 m)   Wt 237 lb 12.8 oz (107.9 kg)   SpO2 95%   BMI 29.92 kg/m  Gen: NAD, resting comfortably HEENT: Mucous membranes are moist. Oropharynx normal Neck: no thyromegaly CV: RRR no murmurs rubs or gallops Lungs: CTAB no crackles, wheeze, rhonchi Abdomen: soft/nontender/nondistended/normal bowel sounds. No rebound or guarding. Overweight but improved Ext: no edema Skin: warm, dry Neuro: grossly normal, moves all extremities, PERRLA Rectal: normal tone, normal  sized prostate(large side), no masses or tenderness   Assessment/Plan:  56 y.o. male presenting for annual physical.  Health Maintenance counseling: 1. Anticipatory guidance: Patient counseled regarding regular dental exams q6 month, eye exams yearly, wearing seatbelts.  2. Risk factor reduction:  Advised patient of need for regular exercise and diet rich and fruits and vegetables to reduce risk of heart attack and stroke. Exercise- minimal- encouraged 150 minutes a week. Diet-was doing 1200 calorie diet through [facility] now up to 1500 and still gradually losing Wt Readings from Last 3 Encounters:  09/24/16 237 lb 12.8 oz (107.9 kg)  03/21/16 250 lb (113.4 kg)  01/07/16 256 lb 9.6 oz (116.4 kg)  3. Immunizations/screenings/ancillary studies- advised and given Tdap today as well as shingrix Immunization History  Administered Date(s) Administered  . Influenza Split 01/15/2012  . Influenza Whole 04/19/2007, 12/25/2008, 01/21/2010  . Influenza,inj,Quad PF,36+ Mos 12/06/2012, 04/17/2014, 12/27/2014, 01/07/2016  . Td 09/14/2005  4. Prostate cancer screening-  low risk prior PSA trend- update today. Rectal exam low risk Lab Results  Component Value Date   PSA 0.45 12/11/2014   PSA 0.69 06/07/2013   PSA 0.44 05/10/2008   5. Colon cancer screening - 05/28/15 with 5 year follow up- he reports polyp or two 6. Skin cancer screening- uses sunscreen regularly  Status of chronic or acute concerns   On nexium for GERD  Seborrheic dermatitis PA for Elidel is denied. Patient has to try generic tacrolimus ointment before Elidel will be covered. We will trial this.   Hypogonadism in male S has [facility] and they have started on weekly Friday testosterone injections- he is not using the creams. Apparently was on 0.75 cc, now up to 1 cc. On estrogen blocker as well. Has noted big improvement in mood and energy A/P: update testosterone today  Essential hypertension S: controlled on  lisinopril-hct 20-25mg . Did not take medicine yet today and has been doing really well at [place] 120s/80s or lower ASCVD 10 year risk calculation if age 31-79: get lipids today BP Readings from Last 3 Encounters:  09/24/16 136/84  03/21/16 (!) 152/82  01/07/16 118/80  A/P: We discussed blood pressure goal of <140/90. Continue current meds  Hyperlipidemia S: poorly controlled despite being on vytorin in the past- risk was below 7.5 % off medicine and we have been working on weight loss. No myalgias.  Lab Results  Component Value Date   CHOL 238 (H) 12/11/2014   HDL 49.30 12/11/2014   LDLCALC 160 (H) 12/11/2014   LDLDIRECT 141.8 08/02/2012   TRIG 144.0 12/11/2014   CHOLHDL 5 12/11/2014   A/P: update cholesterol today and recalculate risk  Hyperglycemia S: at risk for diabetes based on cbg- we have discussed needing weight loss No results found for: HGBA1C  A/P: update a1c today and  continue efforts on weigh tloss  Depression, major, recurrent, mild (HCC) S: depression well controlled on cymbalta 60mg . No SI. Sometimes winter months are harder so thankful summer months for him.  A/P: continue current meds- asks about effexor for less weight loss help but per uptodate weight gain issue are similar   6 month  Orders Placed This Encounter  Procedures  . Hemoglobin A1c    [place]  . Testosterone  . CBC    [place]  . Comprehensive metabolic panel    [place]    Order Specific Question:   Has the patient fasted?    Answer:   No  . Lipid panel        Order Specific Question:   Has the patient fasted?    Answer:   No  . PSA    Meds ordered this encounter  Medications  . tacrolimus (PROTOPIC) 0.1 % ointment    Sig: Apply topically 2 (two) times daily. As needed for seborrheic dermatitis    Dispense:  100 g    Refill:  5    Return precautions advised.  Tana ConchStephen Karleen Seebeck, MD

## 2016-09-24 NOTE — Assessment & Plan Note (Signed)
S: depression well controlled on cymbalta 60mg . No SI. Sometimes winter months are harder so thankful summer months for him.  A/P: continue current meds- asks about effexor for less weight loss help but per uptodate weight gain issue are similar

## 2016-09-24 NOTE — Assessment & Plan Note (Signed)
PA for Elidel is denied. Patient has to try generic tacrolimus ointment before Elidel will be covered. We will trial this.

## 2016-09-24 NOTE — Assessment & Plan Note (Signed)
S: at risk for diabetes based on cbg- we have discussed needing weight loss No results found for: HGBA1C  A/P: update a1c today and continue efforts on weigh tloss

## 2016-09-24 NOTE — Assessment & Plan Note (Signed)
[facility] and they have started on weekly Friday testosterone injections- he is not using the creams. Apparently was on 0.75 cc, now up to 1 cc. On estrogen blocker as well. Has noted big improvement in mood and energy A/P: update testosterone today

## 2016-09-24 NOTE — Assessment & Plan Note (Signed)
S: controlled on lisinopril-hct 20-25mg . Did not take medicine yet today and has been doing really well at [place] 120s/80s or lower ASCVD 10 year risk calculation if age 54-79: get lipids today BP Readings from Last 3 Encounters:  09/24/16 136/84  03/21/16 (!) 152/82  01/07/16 118/80  A/P: We discussed blood pressure goal of <140/90. Continue current meds

## 2016-09-24 NOTE — Addendum Note (Signed)
Addended by: Heide SparkSOUTHERN HIZER, JAMIE M on: 09/24/2016 09:06 AM   Modules accepted: Orders

## 2016-09-25 ENCOUNTER — Telehealth: Payer: Self-pay | Admitting: Family Medicine

## 2016-09-25 NOTE — Telephone Encounter (Signed)
He may try hydrocortisone 1% over the counter to start. Would use 2.5% as next step if that doesn't help. Should use twice a day with flares for no more than 7 days

## 2016-09-25 NOTE — Telephone Encounter (Signed)
I spoke with patient after speaking to the pharmacy. He wants to know if Westcort cream will work for him? His mom is using it so he is thinking it might be cheaper? Please advise

## 2016-09-25 NOTE — Telephone Encounter (Signed)
Pt need to see if there is another cream that could be prescribed for him the one that was sent in was too expensive ($800.00 and his copay will be $186.00) he is not able to afford this medication.

## 2016-09-26 DIAGNOSIS — E291 Testicular hypofunction: Secondary | ICD-10-CM | POA: Diagnosis not present

## 2016-10-03 DIAGNOSIS — E782 Mixed hyperlipidemia: Secondary | ICD-10-CM | POA: Diagnosis not present

## 2016-10-03 DIAGNOSIS — E6609 Other obesity due to excess calories: Secondary | ICD-10-CM | POA: Diagnosis not present

## 2016-10-03 DIAGNOSIS — R7303 Prediabetes: Secondary | ICD-10-CM | POA: Diagnosis not present

## 2016-10-03 DIAGNOSIS — E291 Testicular hypofunction: Secondary | ICD-10-CM | POA: Diagnosis not present

## 2016-10-03 NOTE — Telephone Encounter (Signed)
Spoke with patient who verbalized understanding.

## 2016-10-10 DIAGNOSIS — E291 Testicular hypofunction: Secondary | ICD-10-CM | POA: Diagnosis not present

## 2016-10-13 DIAGNOSIS — E291 Testicular hypofunction: Secondary | ICD-10-CM | POA: Diagnosis not present

## 2016-10-17 DIAGNOSIS — E291 Testicular hypofunction: Secondary | ICD-10-CM | POA: Diagnosis not present

## 2016-10-17 DIAGNOSIS — E6609 Other obesity due to excess calories: Secondary | ICD-10-CM | POA: Diagnosis not present

## 2016-10-17 DIAGNOSIS — L709 Acne, unspecified: Secondary | ICD-10-CM | POA: Diagnosis not present

## 2016-10-17 DIAGNOSIS — Z8659 Personal history of other mental and behavioral disorders: Secondary | ICD-10-CM | POA: Diagnosis not present

## 2016-10-30 DIAGNOSIS — R7303 Prediabetes: Secondary | ICD-10-CM | POA: Diagnosis not present

## 2016-10-30 DIAGNOSIS — E291 Testicular hypofunction: Secondary | ICD-10-CM | POA: Diagnosis not present

## 2016-10-30 DIAGNOSIS — L709 Acne, unspecified: Secondary | ICD-10-CM | POA: Diagnosis not present

## 2016-10-30 DIAGNOSIS — E782 Mixed hyperlipidemia: Secondary | ICD-10-CM | POA: Diagnosis not present

## 2016-11-13 DIAGNOSIS — E291 Testicular hypofunction: Secondary | ICD-10-CM | POA: Diagnosis not present

## 2016-11-13 DIAGNOSIS — E669 Obesity, unspecified: Secondary | ICD-10-CM | POA: Diagnosis not present

## 2016-11-13 DIAGNOSIS — I10 Essential (primary) hypertension: Secondary | ICD-10-CM | POA: Diagnosis not present

## 2016-11-13 DIAGNOSIS — R7303 Prediabetes: Secondary | ICD-10-CM | POA: Diagnosis not present

## 2016-11-24 DIAGNOSIS — M722 Plantar fascial fibromatosis: Secondary | ICD-10-CM | POA: Diagnosis not present

## 2016-11-27 DIAGNOSIS — E291 Testicular hypofunction: Secondary | ICD-10-CM | POA: Diagnosis not present

## 2016-11-27 DIAGNOSIS — E782 Mixed hyperlipidemia: Secondary | ICD-10-CM | POA: Diagnosis not present

## 2016-11-27 DIAGNOSIS — E6609 Other obesity due to excess calories: Secondary | ICD-10-CM | POA: Diagnosis not present

## 2016-11-27 DIAGNOSIS — R7303 Prediabetes: Secondary | ICD-10-CM | POA: Diagnosis not present

## 2016-11-28 ENCOUNTER — Ambulatory Visit (INDEPENDENT_AMBULATORY_CARE_PROVIDER_SITE_OTHER): Payer: BLUE CROSS/BLUE SHIELD

## 2016-11-28 DIAGNOSIS — Z23 Encounter for immunization: Secondary | ICD-10-CM | POA: Diagnosis not present

## 2016-12-01 ENCOUNTER — Ambulatory Visit: Payer: BLUE CROSS/BLUE SHIELD

## 2016-12-03 ENCOUNTER — Other Ambulatory Visit: Payer: Self-pay | Admitting: Family Medicine

## 2016-12-15 ENCOUNTER — Ambulatory Visit (INDEPENDENT_AMBULATORY_CARE_PROVIDER_SITE_OTHER): Payer: BLUE CROSS/BLUE SHIELD | Admitting: Family Medicine

## 2016-12-15 DIAGNOSIS — Z23 Encounter for immunization: Secondary | ICD-10-CM

## 2016-12-15 NOTE — Progress Notes (Signed)
Patient here today for nurse visit for flu shot.  I have reviewed the patient's encounter and agree with the documentation.  Katina Degree. Jimmey Ralph, MD 12/15/2016 2:40 PM

## 2016-12-18 DIAGNOSIS — E669 Obesity, unspecified: Secondary | ICD-10-CM | POA: Diagnosis not present

## 2016-12-29 DIAGNOSIS — E669 Obesity, unspecified: Secondary | ICD-10-CM | POA: Diagnosis not present

## 2016-12-29 DIAGNOSIS — E782 Mixed hyperlipidemia: Secondary | ICD-10-CM | POA: Diagnosis not present

## 2016-12-29 DIAGNOSIS — E8881 Metabolic syndrome: Secondary | ICD-10-CM | POA: Diagnosis not present

## 2016-12-29 DIAGNOSIS — E6609 Other obesity due to excess calories: Secondary | ICD-10-CM | POA: Diagnosis not present

## 2016-12-29 DIAGNOSIS — R7303 Prediabetes: Secondary | ICD-10-CM | POA: Diagnosis not present

## 2017-01-05 DIAGNOSIS — E669 Obesity, unspecified: Secondary | ICD-10-CM | POA: Diagnosis not present

## 2017-01-19 DIAGNOSIS — R7303 Prediabetes: Secondary | ICD-10-CM | POA: Diagnosis not present

## 2017-01-19 DIAGNOSIS — E782 Mixed hyperlipidemia: Secondary | ICD-10-CM | POA: Diagnosis not present

## 2017-01-19 DIAGNOSIS — I10 Essential (primary) hypertension: Secondary | ICD-10-CM | POA: Diagnosis not present

## 2017-01-19 DIAGNOSIS — Z713 Dietary counseling and surveillance: Secondary | ICD-10-CM | POA: Diagnosis not present

## 2017-01-19 DIAGNOSIS — E669 Obesity, unspecified: Secondary | ICD-10-CM | POA: Diagnosis not present

## 2017-02-12 DIAGNOSIS — E669 Obesity, unspecified: Secondary | ICD-10-CM | POA: Diagnosis not present

## 2017-04-13 DIAGNOSIS — E291 Testicular hypofunction: Secondary | ICD-10-CM | POA: Diagnosis not present

## 2017-04-20 ENCOUNTER — Other Ambulatory Visit: Payer: Self-pay | Admitting: Family Medicine

## 2017-04-20 DIAGNOSIS — R6882 Decreased libido: Secondary | ICD-10-CM | POA: Diagnosis not present

## 2017-04-20 DIAGNOSIS — E291 Testicular hypofunction: Secondary | ICD-10-CM | POA: Diagnosis not present

## 2017-04-27 ENCOUNTER — Ambulatory Visit: Payer: BLUE CROSS/BLUE SHIELD | Admitting: Family Medicine

## 2017-04-27 ENCOUNTER — Encounter: Payer: Self-pay | Admitting: Family Medicine

## 2017-04-27 VITALS — BP 130/80 | HR 71 | Temp 97.9°F | Ht 74.75 in | Wt 246.5 lb

## 2017-04-27 DIAGNOSIS — R05 Cough: Secondary | ICD-10-CM | POA: Diagnosis not present

## 2017-04-27 DIAGNOSIS — R059 Cough, unspecified: Secondary | ICD-10-CM

## 2017-04-27 MED ORDER — METHYLPREDNISOLONE ACETATE 80 MG/ML IJ SUSP
80.0000 mg | Freq: Once | INTRAMUSCULAR | Status: AC
  Administered 2017-04-27: 80 mg via INTRAMUSCULAR

## 2017-04-27 MED ORDER — AZITHROMYCIN 250 MG PO TABS: ORAL_TABLET | ORAL | 0 refills | Status: DC

## 2017-04-27 MED ORDER — IPRATROPIUM BROMIDE 0.06 % NA SOLN: 2.0000 | Freq: Four times a day (QID) | NASAL | 0 refills | Status: DC

## 2017-04-27 MED ORDER — GUAIFENESIN-CODEINE 100-10 MG/5ML PO SOLN: 5.0000 mL | Freq: Three times a day (TID) | ORAL | 0 refills | Status: DC | PRN

## 2017-04-27 NOTE — Patient Instructions (Signed)
Start the atrovent.  Start cough syrup for your cough.  Start the zpack if your symptoms worsen or do not improve in a few days.  Please stay well hydrated.  You can take tylenol and/or motrin as needed for low grade fever and pain.  Please let me know if your symptoms worsen or fail to improve.  Take care, Dr Sonal Dorwart  

## 2017-04-27 NOTE — Addendum Note (Signed)
Addended by: Jimmye NormanPHANOS, Aliyanna Wassmer J on: 04/27/2017 04:41 PM   Modules accepted: Orders

## 2017-04-27 NOTE — Progress Notes (Signed)
    Subjective:  Jason Lopez is a 57 y.o. male who presents today for same-day appointment with a chief complaint of cough.   HPI:  Cough, Acute Issue Started about 5 days ago. Worsened over that time. Tried taking OTC meds which has not helped significantly. No known sick contacts. No fevers or chills. Symptoms worse at night. No other obvious alleviating or aggravating factors.   ROS: Per HPI  PMH: He reports that  has never smoked. he has never used smokeless tobacco. He reports that he drinks alcohol. He reports that he does not use drugs.  Objective:  Physical Exam: BP 130/80 (BP Location: Left Arm, Patient Position: Sitting, Cuff Size: Large)   Pulse 71   Temp 97.9 F (36.6 C) (Oral)   Ht 6' 2.75" (1.899 m)   Wt 246 lb 8 oz (111.8 kg)   SpO2 97%   BMI 31.02 kg/m   Gen: NAD, resting comfortably HEENT: TMs with clear effusion bilaterally. OP clear.  Maxillary sinuses with decreased transillumination bilaterally.  No lymphadenopathy. CV: RRR with no murmurs appreciated Pulm: NWOB, CTAB with no crackles, wheezes, or rhonchi  Assessment/Plan:  Cough Likely secondary to viral URI. No signs of bacterial infection. Start atrovent for rhinorrhea/sinus congestion. Will give 80mg  IM depo-medrol for sore throat. Start guaifenesin-codeine for cough. Sent in a "pocket prescription" for azithromycin with strict instruction to not start unless symptoms worsen or fail to improve within the next several days. Recommended tylenol and/or motrin as needed for low grade fever and pain. Encouraged good oral hydration. Return precautions reviewed. Follow up as needed.    Katina Degreealeb M. Jimmey RalphParker, MD 04/27/2017 3:57 PM

## 2017-06-08 ENCOUNTER — Other Ambulatory Visit: Payer: Self-pay

## 2017-06-08 DIAGNOSIS — E291 Testicular hypofunction: Secondary | ICD-10-CM | POA: Diagnosis not present

## 2017-06-08 DIAGNOSIS — R6882 Decreased libido: Secondary | ICD-10-CM | POA: Diagnosis not present

## 2017-06-08 MED ORDER — LISINOPRIL-HYDROCHLOROTHIAZIDE 20-25 MG PO TABS: 1.0000 | ORAL_TABLET | Freq: Every day | ORAL | 1 refills | Status: DC

## 2017-06-10 DIAGNOSIS — L709 Acne, unspecified: Secondary | ICD-10-CM | POA: Diagnosis not present

## 2017-06-10 DIAGNOSIS — I10 Essential (primary) hypertension: Secondary | ICD-10-CM | POA: Diagnosis not present

## 2017-06-10 DIAGNOSIS — R6882 Decreased libido: Secondary | ICD-10-CM | POA: Diagnosis not present

## 2017-06-10 DIAGNOSIS — E291 Testicular hypofunction: Secondary | ICD-10-CM | POA: Diagnosis not present

## 2017-06-24 DIAGNOSIS — H35033 Hypertensive retinopathy, bilateral: Secondary | ICD-10-CM | POA: Diagnosis not present

## 2017-07-10 DIAGNOSIS — Z8659 Personal history of other mental and behavioral disorders: Secondary | ICD-10-CM | POA: Diagnosis not present

## 2017-07-10 DIAGNOSIS — E291 Testicular hypofunction: Secondary | ICD-10-CM | POA: Diagnosis not present

## 2017-07-10 DIAGNOSIS — R6882 Decreased libido: Secondary | ICD-10-CM | POA: Diagnosis not present

## 2017-07-10 DIAGNOSIS — L709 Acne, unspecified: Secondary | ICD-10-CM | POA: Diagnosis not present

## 2017-07-13 DIAGNOSIS — L709 Acne, unspecified: Secondary | ICD-10-CM | POA: Diagnosis not present

## 2017-07-13 DIAGNOSIS — R6882 Decreased libido: Secondary | ICD-10-CM | POA: Diagnosis not present

## 2017-07-13 DIAGNOSIS — R5383 Other fatigue: Secondary | ICD-10-CM | POA: Diagnosis not present

## 2017-07-13 DIAGNOSIS — E291 Testicular hypofunction: Secondary | ICD-10-CM | POA: Diagnosis not present

## 2017-07-31 ENCOUNTER — Other Ambulatory Visit: Payer: Self-pay | Admitting: Family Medicine

## 2017-08-17 ENCOUNTER — Ambulatory Visit: Payer: BLUE CROSS/BLUE SHIELD | Admitting: Family Medicine

## 2017-08-17 ENCOUNTER — Encounter: Payer: Self-pay | Admitting: Family Medicine

## 2017-08-17 VITALS — BP 138/88 | HR 76 | Temp 98.4°F | Ht 74.75 in | Wt 253.6 lb

## 2017-08-17 DIAGNOSIS — L219 Seborrheic dermatitis, unspecified: Secondary | ICD-10-CM | POA: Diagnosis not present

## 2017-08-17 DIAGNOSIS — F330 Major depressive disorder, recurrent, mild: Secondary | ICD-10-CM

## 2017-08-17 DIAGNOSIS — E785 Hyperlipidemia, unspecified: Secondary | ICD-10-CM

## 2017-08-17 DIAGNOSIS — I10 Essential (primary) hypertension: Secondary | ICD-10-CM | POA: Diagnosis not present

## 2017-08-17 MED ORDER — DULOXETINE HCL 60 MG PO CPEP: ORAL_CAPSULE | ORAL | 2 refills | Status: DC

## 2017-08-17 MED ORDER — FLUTICASONE PROPIONATE 50 MCG/ACT NA SUSP: NASAL | 2 refills | Status: AC

## 2017-08-17 MED ORDER — TACROLIMUS 0.1 % EX OINT: TOPICAL_OINTMENT | Freq: Two times a day (BID) | CUTANEOUS | 0 refills | Status: DC

## 2017-08-17 MED ORDER — DULOXETINE HCL 60 MG PO CPEP: ORAL_CAPSULE | ORAL | 0 refills | Status: DC

## 2017-08-17 NOTE — Assessment & Plan Note (Signed)
S: Patient is using generic tacrolimus for seborrheic dermatitis. Eye brows will get real irritated, flaky and even burn. Has had good success with Elidel in past but not covered as first step A/P: We will send in tacrolimus 1% ointment to use up to 7 days as needed for seborrheic dermatitis.  If fails to improve could submit prior authorization for Elidel

## 2017-08-17 NOTE — Assessment & Plan Note (Signed)
S: Lipids elevated with 10-year ASCVD risk of 7.1% A/P: We discussed importance of weight loss, healthy eating, regular exercise-hold off on statin and continue to focus on these.  Staying off statin may help reduce diabetes risk as well. He will be due in July (11th or later)

## 2017-08-17 NOTE — Assessment & Plan Note (Addendum)
S: controlled on lisinopril-hydrochlorthiazide 20-25 mg though upper limit of normal. also on spironolactone through [facility] (potassium levels checked) BP Readings from Last 3 Encounters:  08/17/17 138/88  04/27/17 130/80  09/24/16 136/84  A/P: We discussed blood pressure goal of <140/90. Continue current meds:  Encouraged weight loss as well

## 2017-08-17 NOTE — Patient Instructions (Addendum)
I refilled your Flonase-hope you are not congestion improves  Refilled her Cymbalta and you are doing well with this.  Try to get back on track on the weight loss.  Phentermine helps but you can certainly do this on your own as well.  Get back on your calorie counting.  Focus on regular exercise-would like to see lose at least 5 to 10 pounds by next visit.  Schedule physical in 4 to 5 months.  Come fasting.  Hopefully the tach: This is affordable for seborrheic dermatitis-thanks for trying hydrocortisone 1%

## 2017-08-17 NOTE — Assessment & Plan Note (Signed)
S: Reasonably controlled with PHQ 9 of 2 on Cymbalta 60 mg.  No SI. A/P: Continue current medications-he is doing well. Recurrent mild in full remission.

## 2017-08-17 NOTE — Progress Notes (Signed)
Subjective:  Jason Lopez is a 57 y.o. year old very pleasant male patient who presents for/with See problem oriented charting ROS- No chest pain or shortness of breath. No headache or blurry vision.  No SI.  Past Medical History-  Patient Active Problem List   Diagnosis Date Noted  . Hyperglycemia 12/27/2014    Priority: Medium  . Obesity 04/17/2014    Priority: Medium  . Depression, major, recurrent, mild (HCC) 04/22/2010    Priority: Medium  . Hypogonadism in male 01/08/2010    Priority: Medium  . ERECTILE DYSFUNCTION, MILD 08/02/2007    Priority: Medium  . Hyperlipidemia 12/09/2006    Priority: Medium  . Essential hypertension 12/09/2006    Priority: Medium  . Seborrheic dermatitis 04/17/2014    Priority: Low  . PLANTAR FASCIITIS 01/21/2010    Priority: Low  . RADIAL NERVE INJURY 01/21/2010    Priority: Low  . Allergic rhinitis 12/09/2006    Priority: Low  . GERD 12/09/2006    Priority: Low  . History of colonic polyps 12/09/2006    Priority: Low  . RUQ abdominal pain 05/14/2015  . Tendinopathy of right rotator cuff 12/27/2014    Medications- reviewed and updated Current Outpatient Medications  Medication Sig Dispense Refill  . DULoxetine (CYMBALTA) 60 MG capsule TAKE ONE CAPSULE EACH DAY 90 capsule 2  . esomeprazole (NEXIUM) 20 MG capsule Take 40 mg by mouth daily at 12 noon. OTC    . fluticasone (FLONASE) 50 MCG/ACT nasal spray PLACE 2 SPRAYS INTO THE NOSE DAILY. 16 g 2  . guaiFENesin-codeine 100-10 MG/5ML syrup Take 5 mLs by mouth 3 (three) times daily as needed for cough. 120 mL 0  . ipratropium (ATROVENT) 0.06 % nasal spray Place 2 sprays into both nostrils 4 (four) times daily. 15 mL 0  . lisinopril-hydrochlorothiazide (PRINZIDE,ZESTORETIC) 20-25 MG tablet Take 1 tablet by mouth daily. 90 tablet 1  . Multiple Vitamin (MULTIVITAMIN) tablet Take 1 tablet by mouth daily.      Marland Kitchen spironolactone (ALDACTONE) 25 MG tablet Take 25 mg by mouth daily.   0  .  tacrolimus (PROTOPIC) 0.1 % ointment Apply topically 2 (two) times daily. For up to 7 days for seborrheic dermatitis 100 g 0  . testosterone cypionate (DEPOTESTOTERONE CYPIONATE) 100 MG/ML injection Inject 50 mg into the muscle 2 (two) times a week. For IM use only     No current facility-administered medications for this visit.     Objective: BP 138/88 (BP Location: Right Arm, Patient Position: Sitting, Cuff Size: Normal)   Pulse 76   Temp 98.4 F (36.9 C) (Oral)   Ht 6' 2.75" (1.899 m)   Wt 253 lb 9.6 oz (115 kg)   SpO2 96%   BMI 31.91 kg/m  Gen: NAD, resting comfortably Mild erythema and eyebrows with some flaking Oropharynx normal CV: RRR no murmurs rubs or gallops Lungs: CTAB no crackles, wheeze, rhonchi Abdomen: soft/nontender/nondistended/normal bowel sounds.  Obese Ext: no edema Skin: warm, dry  Assessment/Plan:  Other notes: 1. wakes up feeling congested in middle of night. Using antihistamine and flonase (needs refill)  2. Weight up 7 lbs unfortunately- says he had been using phentermine (advised against with high BP) . See avs instructions 3. Had potassium checked with [facility]- he will have them send [place] bloodwork  Essential hypertension S: controlled on lisinopril-hydrochlorthiazide 20-25 mg though upper limit of normal. also on spironolactone through [facility] (potassium levels checked) BP Readings from Last 3 Encounters:  08/17/17 138/88  04/27/17  130/80  09/24/16 136/84  A/P: We discussed blood pressure goal of <140/90. Continue current meds:  Encouraged weight loss as well  Hyperlipidemia S: Lipids elevated with 10-year ASCVD risk of 7.1% A/P: We discussed importance of weight loss, healthy eating, regular exercise-hold off on statin and continue to focus on these.  Staying off statin may help reduce diabetes risk as well. He will be due in July (11th or later)  Depression, major, recurrent, mild (HCC) S: Reasonably controlled with PHQ 9 of 2 on Cymbalta 60  mg.  No SI. A/P: Continue current medications-he is doing well. Recurrent mild in full remission.   Seborrheic dermatitis S: Patient is using generic tacrolimus for seborrheic dermatitis. Eye brows will get real irritated, flaky and even burn. Has had good success with Elidel in past but not covered as first step A/P: We will send in tacrolimus 1% ointment to use up to 7 days as needed for seborrheic dermatitis.  If fails to improve could submit prior authorization for Elidel  Schedule physical in 4 to 5 months.  Come fasting.  Meds ordered this encounter  Medications  . DISCONTD: DULoxetine (CYMBALTA) 60 MG capsule    Sig: TAKE ONE CAPSULE EACH DAY    Dispense:  90 capsule    Refill:  0  . tacrolimus (PROTOPIC) 0.1 % ointment    Sig: Apply topically 2 (two) times daily. For up to 7 days for seborrheic dermatitis    Dispense:  100 g    Refill:  0  . DULoxetine (CYMBALTA) 60 MG capsule    Sig: TAKE ONE CAPSULE EACH DAY    Dispense:  90 capsule    Refill:  2  . fluticasone (FLONASE) 50 MCG/ACT nasal spray    Sig: PLACE 2 SPRAYS INTO THE NOSE DAILY.    Dispense:  16 g    Refill:  2   Return precautions advised.  Tana ConchStephen Meshilem Machuca, MD

## 2017-09-18 DIAGNOSIS — R5383 Other fatigue: Secondary | ICD-10-CM | POA: Diagnosis not present

## 2017-09-18 DIAGNOSIS — R6882 Decreased libido: Secondary | ICD-10-CM | POA: Diagnosis not present

## 2017-09-18 DIAGNOSIS — L709 Acne, unspecified: Secondary | ICD-10-CM | POA: Diagnosis not present

## 2017-09-18 DIAGNOSIS — E291 Testicular hypofunction: Secondary | ICD-10-CM | POA: Diagnosis not present

## 2017-09-21 DIAGNOSIS — R232 Flushing: Secondary | ICD-10-CM | POA: Diagnosis not present

## 2017-09-21 DIAGNOSIS — R5383 Other fatigue: Secondary | ICD-10-CM | POA: Diagnosis not present

## 2017-09-21 DIAGNOSIS — M2550 Pain in unspecified joint: Secondary | ICD-10-CM | POA: Diagnosis not present

## 2017-09-21 DIAGNOSIS — E291 Testicular hypofunction: Secondary | ICD-10-CM | POA: Diagnosis not present

## 2017-10-22 DIAGNOSIS — E291 Testicular hypofunction: Secondary | ICD-10-CM | POA: Diagnosis not present

## 2017-10-22 DIAGNOSIS — Z72820 Sleep deprivation: Secondary | ICD-10-CM | POA: Diagnosis not present

## 2017-10-26 DIAGNOSIS — Z8659 Personal history of other mental and behavioral disorders: Secondary | ICD-10-CM | POA: Diagnosis not present

## 2017-10-26 DIAGNOSIS — R5383 Other fatigue: Secondary | ICD-10-CM | POA: Diagnosis not present

## 2017-10-26 DIAGNOSIS — L709 Acne, unspecified: Secondary | ICD-10-CM | POA: Diagnosis not present

## 2017-10-26 DIAGNOSIS — E291 Testicular hypofunction: Secondary | ICD-10-CM | POA: Diagnosis not present

## 2017-12-06 ENCOUNTER — Other Ambulatory Visit: Payer: Self-pay | Admitting: Family Medicine

## 2017-12-10 ENCOUNTER — Other Ambulatory Visit: Payer: Self-pay | Admitting: Family Medicine

## 2017-12-21 DIAGNOSIS — M7541 Impingement syndrome of right shoulder: Secondary | ICD-10-CM | POA: Diagnosis not present

## 2017-12-21 DIAGNOSIS — M25572 Pain in left ankle and joints of left foot: Secondary | ICD-10-CM | POA: Diagnosis not present

## 2018-01-04 ENCOUNTER — Telehealth: Payer: Self-pay

## 2018-01-04 ENCOUNTER — Ambulatory Visit (INDEPENDENT_AMBULATORY_CARE_PROVIDER_SITE_OTHER): Payer: BLUE CROSS/BLUE SHIELD | Admitting: Family Medicine

## 2018-01-04 ENCOUNTER — Encounter: Payer: Self-pay | Admitting: Family Medicine

## 2018-01-04 VITALS — BP 128/76 | HR 67 | Temp 98.4°F | Ht 74.75 in | Wt 258.4 lb

## 2018-01-04 DIAGNOSIS — L219 Seborrheic dermatitis, unspecified: Secondary | ICD-10-CM

## 2018-01-04 DIAGNOSIS — E291 Testicular hypofunction: Secondary | ICD-10-CM | POA: Diagnosis not present

## 2018-01-04 DIAGNOSIS — E785 Hyperlipidemia, unspecified: Secondary | ICD-10-CM | POA: Diagnosis not present

## 2018-01-04 DIAGNOSIS — Z125 Encounter for screening for malignant neoplasm of prostate: Secondary | ICD-10-CM | POA: Diagnosis not present

## 2018-01-04 DIAGNOSIS — R739 Hyperglycemia, unspecified: Secondary | ICD-10-CM | POA: Diagnosis not present

## 2018-01-04 DIAGNOSIS — F330 Major depressive disorder, recurrent, mild: Secondary | ICD-10-CM

## 2018-01-04 DIAGNOSIS — Z0000 Encounter for general adult medical examination without abnormal findings: Secondary | ICD-10-CM

## 2018-01-04 DIAGNOSIS — I10 Essential (primary) hypertension: Secondary | ICD-10-CM | POA: Diagnosis not present

## 2018-01-04 DIAGNOSIS — Z79899 Other long term (current) drug therapy: Secondary | ICD-10-CM | POA: Diagnosis not present

## 2018-01-04 DIAGNOSIS — Z23 Encounter for immunization: Secondary | ICD-10-CM | POA: Diagnosis not present

## 2018-01-04 DIAGNOSIS — E6609 Other obesity due to excess calories: Secondary | ICD-10-CM

## 2018-01-04 LAB — CBC
HEMATOCRIT: 53.5 % — AB (ref 39.0–52.0)
Hemoglobin: 18.1 g/dL (ref 13.0–17.0)
MCHC: 33.8 g/dL (ref 30.0–36.0)
MCV: 83.2 fl (ref 78.0–100.0)
PLATELETS: 206 10*3/uL (ref 150.0–400.0)
RBC: 6.43 Mil/uL — ABNORMAL HIGH (ref 4.22–5.81)
RDW: 14.2 % (ref 11.5–15.5)
WBC: 6.9 10*3/uL (ref 4.0–10.5)

## 2018-01-04 LAB — COMPREHENSIVE METABOLIC PANEL
ALT: 29 U/L (ref 0–53)
AST: 21 U/L (ref 0–37)
Albumin: 4.6 g/dL (ref 3.5–5.2)
Alkaline Phosphatase: 67 U/L (ref 39–117)
BILIRUBIN TOTAL: 0.6 mg/dL (ref 0.2–1.2)
BUN: 16 mg/dL (ref 6–23)
CO2: 33 mEq/L — ABNORMAL HIGH (ref 19–32)
CREATININE: 0.97 mg/dL (ref 0.40–1.50)
Calcium: 9.8 mg/dL (ref 8.4–10.5)
Chloride: 95 mEq/L — ABNORMAL LOW (ref 96–112)
GFR: 85.2 mL/min (ref >60.00)
Glucose, Bld: 149 mg/dL — ABNORMAL HIGH (ref 70–99)
POTASSIUM: 3.7 meq/L (ref 3.5–5.1)
Sodium: 138 mEq/L (ref 135–145)
TOTAL PROTEIN: 7.5 g/dL (ref 6.0–8.3)

## 2018-01-04 LAB — TSH: TSH: 1.91 u[IU]/mL (ref 0.35–4.50)

## 2018-01-04 LAB — LIPID PANEL
CHOL/HDL RATIO: 5
Cholesterol: 242 mg/dL — ABNORMAL HIGH (ref 0–200)
HDL: 51.9 mg/dL (ref >39.00)
LDL Cholesterol: 160 mg/dL — ABNORMAL HIGH (ref 0–99)
NONHDL: 190.46
Triglycerides: 153 mg/dL — ABNORMAL HIGH (ref 0.0–149.0)
VLDL: 30.6 mg/dL (ref 0.0–40.0)

## 2018-01-04 LAB — HEMOGLOBIN A1C: HEMOGLOBIN A1C: 6.6 % — AB (ref 4.6–6.5)

## 2018-01-04 LAB — PSA: PSA: 0.59 ng/mL (ref 0.10–4.00)

## 2018-01-04 LAB — VITAMIN B12: Vitamin B-12: 1225 pg/mL — ABNORMAL HIGH (ref 211–911)

## 2018-01-04 LAB — TESTOSTERONE: Testosterone: 676.72 ng/dL (ref 300.00–890.00)

## 2018-01-04 NOTE — Progress Notes (Addendum)
Phone: [phone]  Subjective:  Patient presents today for their annual physical. Chief complaint-noted.   See problem oriented charting- ROS- full  review of systems was completed and negative except for: some back pain, sinus congestion with barometric pressure changes, sweating, heat intolerance, increased eating  The following were reviewed and entered/updated in epic: Past Medical History:  Diagnosis Date  . COLONIC POLYPS, HX OF 12/09/2006  . ERECTILE DYSFUNCTION, MILD 08/02/2007  . GERD 12/09/2006  . HYPERLIPIDEMIA 12/09/2006  . HYPERTENSION 12/09/2006  . HYPOGONADISM 01/08/2010  . PLANTAR FASCIITIS 01/21/2010  . RADIAL NERVE INJURY 01/21/2010   Patient Active Problem List   Diagnosis Date Noted  . Hyperglycemia 12/27/2014    Priority: Medium  . Obesity 04/17/2014    Priority: Medium  . Depression, major, recurrent, mild (HCC) 04/22/2010    Priority: Medium  . Hypogonadism in male 01/08/2010    Priority: Medium  . ERECTILE DYSFUNCTION, MILD 08/02/2007    Priority: Medium  . Hyperlipidemia 12/09/2006    Priority: Medium  . Essential hypertension 12/09/2006    Priority: Medium  . Seborrheic dermatitis 04/17/2014    Priority: Low  . PLANTAR FASCIITIS 01/21/2010    Priority: Low  . RADIAL NERVE INJURY 01/21/2010    Priority: Low  . Allergic rhinitis 12/09/2006    Priority: Low  . GERD 12/09/2006    Priority: Low  . History of colonic polyps 12/09/2006    Priority: Low  . RUQ abdominal pain 05/14/2015  . Tendinopathy of right rotator cuff 12/27/2014   Past Surgical History:  Procedure Laterality Date  . COLONOSCOPY  2008, 2011    Family History  Problem Relation Age of Onset  . Breast cancer Mother   . Pancreatic cancer Maternal Grandmother   . Diabetes Father   . Hypertension Father        mother  . Hyperlipidemia Father   . Lung cancer Maternal Grandfather        smoker    Medications- reviewed and updated Current Outpatient Medications    Medication Sig Dispense Refill  . anastrozole (ARIMIDEX) 1 MG tablet Take 1 tablet by mouth once a week.  0  . DULoxetine (CYMBALTA) 60 MG capsule TAKE ONE CAPSULE EACH DAY 90 capsule 2  . esomeprazole (NEXIUM) 20 MG capsule Take 40 mg by mouth daily at 12 noon. OTC    . fluticasone (FLONASE) 50 MCG/ACT nasal spray PLACE 2 SPRAYS INTO THE NOSE DAILY. 16 g 2  . lisinopril-hydrochlorothiazide (PRINZIDE,ZESTORETIC) 20-25 MG tablet TAKE 1 TABLET BY MOUTH EVERY DAY 90 tablet 1  . Multiple Vitamin (MULTIVITAMIN) tablet Take 1 tablet by mouth daily.      Marland Kitchen spironolactone (ALDACTONE) 25 MG tablet Take 25 mg by mouth daily.   0   No current facility-administered medications for this visit.     Allergies-reviewed and updated No Known Allergies  Social History   Social History Narrative   Lives alone with mom living at him at times.       Work as a Firefighter: working on house, garden, shop    Objective: BP 128/76 (BP Location: Left Arm, Patient Position: Sitting, Cuff Size: Large)   Pulse 67   Temp 98.4 F (36.9 C) (Oral)   Ht 6' 2.75" (1.899 m)   Wt 258 lb 6.4 oz (117.2 kg)   SpO2 97%   BMI 32.51 kg/m  Gen: NAD, resting comfortably HEENT: Mucous membranes are moist. Oropharynx normal Neck: no thyromegaly CV: RRR  no murmurs rubs or gallops Lungs: CTAB no crackles, wheeze, rhonchi Abdomen: soft/nontender/nondistended/normal bowel sounds. No rebound or guarding.  Ext: no edema Skin: warm, dry Neuro: grossly normal, moves all extremities, PERRLA  Rectal deferred unless PSA trends up  Assessment/Plan:  57 y.o. male presenting for annual physical.  Health Maintenance counseling: 1. Anticipatory guidance: Patient counseled regarding regular dental exams -q6 months, eye exams -yearly, wearing seatbelts.  2. Risk factor reduction:  Advised patient of need for regular exercise and diet rich and fruits and vegetables to reduce risk of heart attack and stroke.  Exercise- not doing well lately- motivation has been low. Diet-weight up 21 pounds in last year- states eating has been out of control lately- not doing [facility] for weight management- just for hormone management (was expensive).  Wt Readings from Last 3 Encounters:  01/04/18 258 lb 6.4 oz (117.2 kg)  08/17/17 253 lb 9.6 oz (115 kg)  04/27/17 246 lb 8 oz (111.8 kg)  3. Immunizations/screenings/ancillary studies-flu shot today  Immunization History  Administered Date(s) Administered  . Influenza Split 01/15/2012  . Influenza Whole 04/19/2007, 12/25/2008, 01/21/2010  . Influenza,inj,Quad PF,6+ Mos 12/06/2012, 04/17/2014, 12/27/2014, 01/07/2016, 12/15/2016  . Td 09/14/2005  . Tdap 09/24/2016  . Zoster Recombinat (Shingrix) 09/24/2016, 11/28/2016  4. Prostate cancer screening- low risk prior PSA trend.  Update PSA today.  Defer rectal unless PSA trend worsening Lab Results  Component Value Date   PSA 0.74 09/24/2016   PSA 0.45 12/11/2014   PSA 0.69 06/07/2013   5. Colon cancer screening - 05/28/2015 with 5-year follow-up due to history of polyps 6. Skin cancer screening- no dermatologist. advised regular sunscreen use. Denies worrisome, changing, or new skin lesions.   Status of chronic or acute concerns   On Nexium for GERD  Seborrheic dermatitis- Elidel was denied.  Still 240 for  generic tacrolimus not taking  Hypogonadism in male- has been [facility].  He is on testosterone injections as well as an estrogen blocker.  Update testosterone today  Hypertension- controlled on lisinopril hydrochlorothiazide 20-25 mg. Off now- I have advised against phentermine given his high blood pressure. On spironolactone through [facility]  Hyperlipidemia- we have been focusing on weight loss.  He was previously on Vytorin.  Update lipids and 10-year risk calculation.  Likely even if #s up- give some more time to work on weight loss- unless risk is well above 10%.   Hyperglycemia-  elevated A1c last year up to 5.9-we have discussed importance of healthy lifestyle choices  Depression-  Reasonably controlled on Cymbalta 60 mg.  No suicidal ideation.  Does tend to use sweets as a stress reliever and we discussed therapy with EMDR or [facility] to help with this portion.  Winter months typically harder than summer months for him- will let us know if depression records.   3-6 month follow up to see how he is doing with weight and blood pressure.   Lab/Order associations: Fasting Preventative health care  Hyperglycemia - Plan: Hemoglobin A1c  Depression, major, recurrent, mild (HCC)  Hypogonadism in male - Plan: Testosterone  Hyperlipidemia, unspecified hyperlipidemia type - Plan: CBC, Comprehensive metabolic panel, Lipid panel  Essential hypertension - Plan: CBC, Comprehensive metabolic panel, Lipid panel  Seborrheic dermatitis  Obesity due to excess calories with serious comorbidity, unspecified classification  Screening for prostate cancer - Plan: PSA  High risk medication use - Plan: Vitamin B12  Return precautions advised.  Tana Conch, MD

## 2018-01-04 NOTE — Addendum Note (Signed)
Addended by: Vicente Males on: 01/04/2018 09:51 AM   Modules accepted: Orders

## 2018-01-04 NOTE — Telephone Encounter (Signed)
CRITICAL VALUE STICKER  CRITICAL VALUE: Hemoglobin 18.1  RECEIVER (on-site recipient of call): London Sheer  DATE & TIME NOTIFIED: 01/04/2018 14:54  MESSENGER (representative from lab): Jeanice Lim  MD NOTIFIED: Dr. Tana Conch  TIME OF NOTIFICATION:14:56  RESPONSE: Pending

## 2018-01-04 NOTE — Addendum Note (Signed)
Addended by: Shelva Majestic on: 01/04/2018 09:40 AM   Modules accepted: Orders

## 2018-01-04 NOTE — Patient Instructions (Addendum)
Health Maintenance Due  Topic Date Due  . INFLUENZA VACCINE -completed today.  Thanks for doing this!  10/15/2017   Please stop by lab before you go  Are you fasting? Just need to know for lab interpretation  Id love if you could update me in about a month with how you are doing with your food choices and your exercise. I want you to pick 1 step in right direction for next month for both of these.   For mental wellness- consider reading changes that heal by Dr. Dola Factor, seeing one of [place] counselors or Dr. Elmarie Shiley

## 2018-01-04 NOTE — Telephone Encounter (Signed)
Noted- will respond on lab notes- he is going to need to have testosterone cut down through [facility]

## 2018-01-05 ENCOUNTER — Other Ambulatory Visit: Payer: Self-pay

## 2018-01-05 DIAGNOSIS — D751 Secondary polycythemia: Secondary | ICD-10-CM

## 2018-01-25 DIAGNOSIS — D751 Secondary polycythemia: Secondary | ICD-10-CM | POA: Diagnosis not present

## 2018-01-25 DIAGNOSIS — E291 Testicular hypofunction: Secondary | ICD-10-CM | POA: Diagnosis not present

## 2018-02-01 DIAGNOSIS — E291 Testicular hypofunction: Secondary | ICD-10-CM | POA: Diagnosis not present

## 2018-02-01 DIAGNOSIS — D751 Secondary polycythemia: Secondary | ICD-10-CM | POA: Diagnosis not present

## 2018-02-01 DIAGNOSIS — R6882 Decreased libido: Secondary | ICD-10-CM | POA: Diagnosis not present

## 2018-02-01 DIAGNOSIS — R4586 Emotional lability: Secondary | ICD-10-CM | POA: Diagnosis not present

## 2018-05-31 ENCOUNTER — Ambulatory Visit: Payer: BLUE CROSS/BLUE SHIELD | Admitting: Family Medicine

## 2018-05-31 ENCOUNTER — Other Ambulatory Visit: Payer: Self-pay

## 2018-05-31 ENCOUNTER — Encounter: Payer: Self-pay | Admitting: Family Medicine

## 2018-05-31 VITALS — BP 138/80 | HR 70 | Temp 98.8°F | Ht 74.75 in | Wt 260.0 lb

## 2018-05-31 DIAGNOSIS — I10 Essential (primary) hypertension: Secondary | ICD-10-CM

## 2018-05-31 DIAGNOSIS — E785 Hyperlipidemia, unspecified: Secondary | ICD-10-CM

## 2018-05-31 DIAGNOSIS — E291 Testicular hypofunction: Secondary | ICD-10-CM | POA: Diagnosis not present

## 2018-05-31 DIAGNOSIS — E1169 Type 2 diabetes mellitus with other specified complication: Secondary | ICD-10-CM

## 2018-05-31 DIAGNOSIS — R739 Hyperglycemia, unspecified: Secondary | ICD-10-CM | POA: Diagnosis not present

## 2018-05-31 DIAGNOSIS — I152 Hypertension secondary to endocrine disorders: Secondary | ICD-10-CM

## 2018-05-31 DIAGNOSIS — E1159 Type 2 diabetes mellitus with other circulatory complications: Secondary | ICD-10-CM

## 2018-05-31 LAB — COMPREHENSIVE METABOLIC PANEL
ALT: 38 U/L (ref 0–53)
AST: 26 U/L (ref 0–37)
Albumin: 4.4 g/dL (ref 3.5–5.2)
Alkaline Phosphatase: 76 U/L (ref 39–117)
BUN: 12 mg/dL (ref 6–23)
CALCIUM: 10 mg/dL (ref 8.4–10.5)
CHLORIDE: 95 meq/L — AB (ref 96–112)
CO2: 34 meq/L — AB (ref 19–32)
CREATININE: 0.89 mg/dL (ref 0.40–1.50)
GFR: 88.41 mL/min (ref >60.00)
GLUCOSE: 181 mg/dL — AB (ref 70–99)
Potassium: 3.9 mEq/L (ref 3.5–5.1)
Sodium: 137 mEq/L (ref 135–145)
Total Bilirubin: 0.4 mg/dL (ref 0.2–1.2)
Total Protein: 7.1 g/dL (ref 6.0–8.3)

## 2018-05-31 LAB — CBC
HCT: 47.5 % (ref 39.0–52.0)
Hemoglobin: 16.1 g/dL (ref 13.0–17.0)
MCHC: 33.8 g/dL (ref 30.0–36.0)
MCV: 80.6 fl (ref 78.0–100.0)
Platelets: 209 10*3/uL (ref 150.0–400.0)
RBC: 5.9 Mil/uL — ABNORMAL HIGH (ref 4.22–5.81)
RDW: 14.8 % (ref 11.5–15.5)
WBC: 7.3 10*3/uL (ref 4.0–10.5)

## 2018-05-31 LAB — HEMOGLOBIN A1C: Hgb A1c MFr Bld: 6.7 % — ABNORMAL HIGH (ref 4.6–6.5)

## 2018-05-31 MED ORDER — LISINOPRIL-HYDROCHLOROTHIAZIDE 20-25 MG PO TABS: 1.0000 | ORAL_TABLET | Freq: Every day | ORAL | 3 refills | Status: DC

## 2018-05-31 NOTE — Assessment & Plan Note (Signed)
S: Patient with elevated A1c last visit-we discussed if had elevated number on repeat today would diagnose diabetes.had lost weight down 8 lbs but then regained with recent stressors. Not exercising right now as time limited.  Lab Results  Component Value Date   HGBA1C 6.7 (H) 05/31/2018   HGBA1C 6.6 (H) 01/04/2018   HGBA1C 5.9 09/24/2016   A/P: Unfortunately patient with new diagnosis of diabetes- I am okay continue without medication as long as A1c is under 7-we will begin working on health maintenance items at follow-up

## 2018-05-31 NOTE — Progress Notes (Signed)
Phone [phone]   Subjective:  Jason Lopez is a 58 y.o. year old very pleasant male patient who presents for/with See problem oriented charting ROS-   No chest pain or shortness of breath. No headache or blurry vision.  Admits to high anxiety and stress recently  Past Medical History-  Patient Active Problem List   Diagnosis Date Noted  . Diabetes mellitus (HCC) 12/27/2014    Priority: High  . Obesity 04/17/2014    Priority: Medium  . Depression, major, recurrent, mild (HCC) 04/22/2010    Priority: Medium  . Hypogonadism in male 01/08/2010    Priority: Medium  . ERECTILE DYSFUNCTION, MILD 08/02/2007    Priority: Medium  . Hyperlipidemia associated with type 2 diabetes mellitus (HCC) 12/09/2006    Priority: Medium  . Hypertension associated with diabetes (HCC) 12/09/2006    Priority: Medium  . Seborrheic dermatitis 04/17/2014    Priority: Low  . PLANTAR FASCIITIS 01/21/2010    Priority: Low  . RADIAL NERVE INJURY 01/21/2010    Priority: Low  . Allergic rhinitis 12/09/2006    Priority: Low  . GERD 12/09/2006    Priority: Low  . History of colonic polyps 12/09/2006    Priority: Low  . RUQ abdominal pain 05/14/2015  . Tendinopathy of right rotator cuff 12/27/2014    Medications- reviewed and updated Current Outpatient Medications  Medication Sig Dispense Refill  . anastrozole (ARIMIDEX) 1 MG tablet Take 1 tablet by mouth once a week.  0  . DULoxetine (CYMBALTA) 60 MG capsule TAKE ONE CAPSULE EACH DAY 90 capsule 2  . esomeprazole (NEXIUM) 20 MG capsule Take 40 mg by mouth daily at 12 noon. OTC    . fluticasone (FLONASE) 50 MCG/ACT nasal spray PLACE 2 SPRAYS INTO THE NOSE DAILY. 16 g 2  . lisinopril-hydrochlorothiazide (PRINZIDE,ZESTORETIC) 20-25 MG tablet Take 1 tablet by mouth daily. 90 tablet 3  . Multiple Vitamin (MULTIVITAMIN) tablet Take 1 tablet by mouth daily.      Marland Kitchen spironolactone (ALDACTONE) 25 MG tablet Take 25 mg by mouth daily.   0   No current  facility-administered medications for this visit.      Objective:  BP 138/80 (BP Location: Left Arm, Patient Position: Sitting, Cuff Size: Normal)   Pulse 70   Temp 98.8 F (37.1 C) (Oral)   Ht 6' 2.75" (1.899 m)   Wt 260 lb (117.9 kg)   SpO2 96%   BMI 32.72 kg/m  Gen: NAD, resting comfortably CV: RRR no murmurs rubs or gallops Lungs: CTAB no crackles, wheeze, rhonchi Abdomen: soft/nontender/obese Ext: no edema Skin: warm, dry Neuro: Gait and speech normal    Assessment and Plan   #Diabetes S: Patient with elevated A1c last visit-we discussed if had elevated number on repeat today would diagnose diabetes.had lost weight down 8 lbs but then regained with recent stressors. Not exercising right now as time limited.  Lab Results  Component Value Date   HGBA1C 6.7 (H) 05/31/2018   HGBA1C 6.6 (H) 01/04/2018   HGBA1C 5.9 09/24/2016   A/P: Unfortunately patient with new diagnosis of diabetes- I am okay continue without medication as long as A1c is under 7-we will begin working on health maintenance items at follow-up.   #Depression S: Depression is controlled on Cymbalta.  PHQ 9 today of 3 A/P: Stable. Continue current medications.    #hypogonadism/polycythemia S: still getting steroid injections/pellets he says with [facility]. Also on arimidex. Has had therapeutic phlebotomy.  A/P: update [facility] today- hopeful this has  improved with their measures  #Hypertension-now associated with diabetes S: controlled on lisinopril hydrochlorothiazide 20-25 mg though high normal.  Also on [facility]actone through [facility]. BP Readings from Last 3 Encounters:  05/31/18 138/80  01/04/18 128/76  08/17/17 138/88  A/P:  Stable. Continue current medications.  Thankfully no hyperkalemia   Other notes: 1.Dad in [facility] for rehab and stuck for 2 weeks and he cant see him with covid 190 that's hard for him.  2. 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  Lab/Order associations: Hypertension associated with diabetes (HCC) - Plan: CBC, Comprehensive metabolic panel  Hyperlipidemia associated with type 2 diabetes mellitus (HCC)  Essential hypertension  Hypogonadism in male  Hyperglycemia - Plan: Hemoglobin A1c  Meds ordered this encounter  Medications  . lisinopril-hydrochlorothiazide (PRINZIDE,ZESTORETIC) 20-25 MG tablet    Sig: Take 1 tablet by mouth daily.    Dispense:  90 tablet    Refill:  3    Return precautions advised.  Tana Conch, MD

## 2018-05-31 NOTE — Patient Instructions (Addendum)
Please stop by lab before you go If you do not have mychart- we will call you about results within 5 business days of [place] receiving them.  If you have mychart- we will send your results within 3 business days of [place] receiving them.  If abnormal or we want to clarify a result, we will call or mychart you to make sure you receive the message.  If you have questions or concerns or don't hear within 5-7 days, please send [place] a message or call us.    4 month follow up

## 2018-08-31 DIAGNOSIS — M5030 Other cervical disc degeneration, unspecified cervical region: Secondary | ICD-10-CM | POA: Diagnosis not present

## 2018-08-31 DIAGNOSIS — R6882 Decreased libido: Secondary | ICD-10-CM | POA: Diagnosis not present

## 2018-08-31 DIAGNOSIS — Z72820 Sleep deprivation: Secondary | ICD-10-CM | POA: Diagnosis not present

## 2018-08-31 DIAGNOSIS — E291 Testicular hypofunction: Secondary | ICD-10-CM | POA: Diagnosis not present

## 2018-08-31 DIAGNOSIS — D751 Secondary polycythemia: Secondary | ICD-10-CM | POA: Diagnosis not present

## 2018-09-02 DIAGNOSIS — R6882 Decreased libido: Secondary | ICD-10-CM | POA: Diagnosis not present

## 2018-09-02 DIAGNOSIS — N529 Male erectile dysfunction, unspecified: Secondary | ICD-10-CM | POA: Diagnosis not present

## 2018-09-02 DIAGNOSIS — M2550 Pain in unspecified joint: Secondary | ICD-10-CM | POA: Diagnosis not present

## 2018-09-02 DIAGNOSIS — E291 Testicular hypofunction: Secondary | ICD-10-CM | POA: Diagnosis not present

## 2018-10-04 ENCOUNTER — Ambulatory Visit (INDEPENDENT_AMBULATORY_CARE_PROVIDER_SITE_OTHER): Payer: BC Managed Care – PPO | Admitting: Family Medicine

## 2018-10-04 ENCOUNTER — Encounter: Payer: Self-pay | Admitting: Family Medicine

## 2018-10-04 VITALS — Ht 74.75 in | Wt 260.0 lb

## 2018-10-04 DIAGNOSIS — F3342 Major depressive disorder, recurrent, in full remission: Secondary | ICD-10-CM

## 2018-10-04 DIAGNOSIS — E785 Hyperlipidemia, unspecified: Secondary | ICD-10-CM

## 2018-10-04 DIAGNOSIS — E1159 Type 2 diabetes mellitus with other circulatory complications: Secondary | ICD-10-CM

## 2018-10-04 DIAGNOSIS — E1169 Type 2 diabetes mellitus with other specified complication: Secondary | ICD-10-CM

## 2018-10-04 DIAGNOSIS — I152 Hypertension secondary to endocrine disorders: Secondary | ICD-10-CM

## 2018-10-04 DIAGNOSIS — E6609 Other obesity due to excess calories: Secondary | ICD-10-CM

## 2018-10-04 DIAGNOSIS — I10 Essential (primary) hypertension: Secondary | ICD-10-CM

## 2018-10-04 DIAGNOSIS — E291 Testicular hypofunction: Secondary | ICD-10-CM

## 2018-10-04 DIAGNOSIS — E119 Type 2 diabetes mellitus without complications: Secondary | ICD-10-CM

## 2018-10-04 NOTE — Patient Instructions (Addendum)
Health Maintenance Due  Topic Date Due  . PNEUMOCOCCAL POLYSACCHARIDE VACCINE AGE 56-64 HIGH RISK  05/14/1964  . FOOT EXAM  05/14/1972  . OPHTHALMOLOGY EXAM  05/14/1972    Depression screen [facility] 2/9 10/04/2018 05/31/2018 08/17/2017  Decreased Interest 1 1 0  Down, Depressed, Hopeless 1 0 0  PHQ - 2 Score 2 1 0  Altered sleeping 0 0 0  Tired, decreased energy 0 0 0  Change in appetite 0 1 1  Feeling bad or failure about yourself  1 0 0  Trouble concentrating 1 1 1   Moving slowly or fidgety/restless 0 0 0  Suicidal thoughts 0 0 0  PHQ-9 Score 4 3 2   Difficult doing work/chores Not difficult at all Not difficult at all Not difficult at all

## 2018-10-04 NOTE — Progress Notes (Signed)
Phone [phone]   Subjective:  Virtual visit via Video note. Chief complaint: Chief Complaint  Patient presents with  . Diabetes   This visit type was conducted due to national recommendations for restrictions regarding the COVID-19 Pandemic (e.g. social distancing).  This format is felt to be most appropriate for this patient at this time balancing risks to patient and risks to population by having him in for in person visit.  No physical exam was performed (except for noted visual exam or audio findings with Telehealth visits).    Our team/I connected with Jason Lopez at 11:20 AM EDT by a video enabled telemedicine application ([email or web address] or caregility through epic) and verified that I am speaking with the correct person using two identifiers.  Location patient: [place] Location provider: [facility], office Persons participating in the virtual visit:  patient  Our team/I discussed the limitations of evaluation and management by telemedicine and the availability of in person appointments. In light of current covid-19 pandemic, patient also understands that we are trying to protect them by minimizing in office contact if at all possible.  The patient expressed consent for telemedicine visit and agreed to proceed. Patient understands insurance will be billed.   ROS- No chest pain or shortness of breath. No headache or blurry vision.    Past Medical History-  Patient Active Problem List   Diagnosis Date Noted  . Diabetes mellitus (HCC) 12/27/2014    Priority: High  . Obesity 04/17/2014    Priority: Medium  . Major depression, recurrent, full remission (HCC) 04/22/2010    Priority: Medium  . Hypogonadism in male 01/08/2010    Priority: Medium  . ERECTILE DYSFUNCTION, MILD 08/02/2007    Priority: Medium  . Hyperlipidemia associated with type 2 diabetes mellitus (HCC) 12/09/2006    Priority: Medium  . Hypertension associated with diabetes (HCC) 12/09/2006    Priority: Medium  .  Seborrheic dermatitis 04/17/2014    Priority: Low  . PLANTAR FASCIITIS 01/21/2010    Priority: Low  . RADIAL NERVE INJURY 01/21/2010    Priority: Low  . Allergic rhinitis 12/09/2006    Priority: Low  . GERD 12/09/2006    Priority: Low  . History of colonic polyps 12/09/2006    Priority: Low  . RUQ abdominal pain 05/14/2015  . Tendinopathy of right rotator cuff 12/27/2014    Medications- reviewed and updated Current Outpatient Medications  Medication Sig Dispense Refill  . anastrozole (ARIMIDEX) 1 MG tablet Take 1 tablet by mouth once a week.  0  . DULoxetine (CYMBALTA) 60 MG capsule TAKE ONE CAPSULE EACH DAY 90 capsule 2  . esomeprazole (NEXIUM) 20 MG capsule Take 40 mg by mouth daily at 12 noon. OTC    . fluticasone (FLONASE) 50 MCG/ACT nasal spray PLACE 2 SPRAYS INTO THE NOSE DAILY. 16 g 2  . lisinopril-hydrochlorothiazide (PRINZIDE,ZESTORETIC) 20-25 MG tablet Take 1 tablet by mouth daily. 90 tablet 3  . Multiple Vitamin (MULTIVITAMIN) tablet Take 1 tablet by mouth daily.      Marland Kitchen. spironolactone (ALDACTONE) 25 MG tablet Take 25 mg by mouth daily.   0   No current facility-administered medications for this visit.      Objective:  Ht 6' 2.75" (1.899 m)   Wt 260 lb (117.9 kg)   BMI 32.72 kg/m  self reported vitals Gen: NAD, resting comfortably Lungs: nonlabored, normal respiratory rate  Skin: appears dry, no obvious rash    Assessment and Plan   # social update- dad with 2 broken  hips and broken nose now just got back at home. Also caring for mom- trying to get her back home. High stress recently.   # Diabetes S: hopefully controlled on no medicine- working on diet/exercise to try to control this. Has started walking other than with high heat Exercise and diet-  fortunately weight stable despite covid 19 and stressors.  Lab Results  Component Value Date   HGBA1C 6.7 (H) 05/31/2018   HGBA1C 6.6 (H) 01/04/2018   HGBA1C 5.9 09/24/2016   A/P: hopefully a1c stable- he  agrees to come by for labs. He prefers to stay off metformin- agreed to this as long as a1c is under 7.   # Depression S: doing reasonably well on cymbalta despite stressors Depression screen [facility] 2/9 10/04/2018  Decreased Interest 1  Down, Depressed, Hopeless 1  PHQ - 2 Score 2  Altered sleeping 0  Tired, decreased energy 0  Change in appetite 0  Feeling bad or failure about yourself  1  Trouble concentrating 1  Moving slowly or fidgety/restless 0  Suicidal thoughts 0  PHQ-9 Score 4  Difficult doing work/chores Not difficult at all  A/P: full remission- continue current rx.   #hypertension S: hopefully controlled on  Lisinopril hctz 20-25 mg and spironolactone (on through [facility]) BP Readings from Last 3 Encounters:  05/31/18 138/80  01/04/18 128/76  08/17/17 138/88  A/P: hopefully controlled- he will locate his cuff and update [place] through mychart with his # - continue current meds for now  # hypogonadism/polycythemia S:still doing steroid pellets through [facility].  Still on arimidex- every other week. Still doing therapeutic phlebtomy as needed A/P: Stable. Continue current medications.   - will check cbc to make sure no polycythemia  Recommended follow up:  Labs in 1-2 weeks, physical in 4 months advised  Lab/Order associations:   ICD-10-CM   1. Type 2 diabetes mellitus without complication, without long-term current use of insulin (HCC)  E11.9 CBC    Basic metabolic panel    Hemoglobin A1c  2. Major depression, recurrent, full remission ([place])  F33.42   3. Hypertension associated with diabetes ([place])  E11.59    I10   4. Hyperlipidemia associated with type 2 diabetes mellitus ([place])  E11.69    E78.5   5. Obesity due to excess calories with serious comorbidity, unspecified classification  E66.09   6. Hypogonadism in male  E29.1    Return precautions advised.  Garret Reddish, MD

## 2018-10-06 ENCOUNTER — Other Ambulatory Visit: Payer: Self-pay | Admitting: Family Medicine

## 2018-10-18 DIAGNOSIS — M7541 Impingement syndrome of right shoulder: Secondary | ICD-10-CM | POA: Diagnosis not present

## 2018-10-18 DIAGNOSIS — M5030 Other cervical disc degeneration, unspecified cervical region: Secondary | ICD-10-CM | POA: Diagnosis not present

## 2018-10-25 DIAGNOSIS — M6281 Muscle weakness (generalized): Secondary | ICD-10-CM | POA: Diagnosis not present

## 2018-10-25 DIAGNOSIS — M25511 Pain in right shoulder: Secondary | ICD-10-CM | POA: Diagnosis not present

## 2018-10-25 DIAGNOSIS — M25611 Stiffness of right shoulder, not elsewhere classified: Secondary | ICD-10-CM | POA: Diagnosis not present

## 2018-10-25 DIAGNOSIS — M542 Cervicalgia: Secondary | ICD-10-CM | POA: Diagnosis not present

## 2018-10-28 DIAGNOSIS — M6281 Muscle weakness (generalized): Secondary | ICD-10-CM | POA: Diagnosis not present

## 2018-10-28 DIAGNOSIS — M542 Cervicalgia: Secondary | ICD-10-CM | POA: Diagnosis not present

## 2018-10-28 DIAGNOSIS — M25611 Stiffness of right shoulder, not elsewhere classified: Secondary | ICD-10-CM | POA: Diagnosis not present

## 2018-10-28 DIAGNOSIS — M25511 Pain in right shoulder: Secondary | ICD-10-CM | POA: Diagnosis not present

## 2018-11-01 DIAGNOSIS — M6281 Muscle weakness (generalized): Secondary | ICD-10-CM | POA: Diagnosis not present

## 2018-11-01 DIAGNOSIS — M25511 Pain in right shoulder: Secondary | ICD-10-CM | POA: Diagnosis not present

## 2018-11-01 DIAGNOSIS — M542 Cervicalgia: Secondary | ICD-10-CM | POA: Diagnosis not present

## 2018-11-01 DIAGNOSIS — M25611 Stiffness of right shoulder, not elsewhere classified: Secondary | ICD-10-CM | POA: Diagnosis not present

## 2018-11-03 DIAGNOSIS — M25511 Pain in right shoulder: Secondary | ICD-10-CM | POA: Diagnosis not present

## 2018-11-03 DIAGNOSIS — M6281 Muscle weakness (generalized): Secondary | ICD-10-CM | POA: Diagnosis not present

## 2018-11-03 DIAGNOSIS — M25611 Stiffness of right shoulder, not elsewhere classified: Secondary | ICD-10-CM | POA: Diagnosis not present

## 2018-11-03 DIAGNOSIS — M542 Cervicalgia: Secondary | ICD-10-CM | POA: Diagnosis not present

## 2018-11-11 DIAGNOSIS — M6281 Muscle weakness (generalized): Secondary | ICD-10-CM | POA: Diagnosis not present

## 2018-11-11 DIAGNOSIS — M542 Cervicalgia: Secondary | ICD-10-CM | POA: Diagnosis not present

## 2018-11-11 DIAGNOSIS — M25611 Stiffness of right shoulder, not elsewhere classified: Secondary | ICD-10-CM | POA: Diagnosis not present

## 2018-11-11 DIAGNOSIS — M25511 Pain in right shoulder: Secondary | ICD-10-CM | POA: Diagnosis not present

## 2018-11-24 DIAGNOSIS — M6281 Muscle weakness (generalized): Secondary | ICD-10-CM | POA: Diagnosis not present

## 2018-11-24 DIAGNOSIS — M25511 Pain in right shoulder: Secondary | ICD-10-CM | POA: Diagnosis not present

## 2018-11-24 DIAGNOSIS — M25611 Stiffness of right shoulder, not elsewhere classified: Secondary | ICD-10-CM | POA: Diagnosis not present

## 2018-11-24 DIAGNOSIS — M542 Cervicalgia: Secondary | ICD-10-CM | POA: Diagnosis not present

## 2018-11-29 DIAGNOSIS — M6281 Muscle weakness (generalized): Secondary | ICD-10-CM | POA: Diagnosis not present

## 2018-11-29 DIAGNOSIS — M25511 Pain in right shoulder: Secondary | ICD-10-CM | POA: Diagnosis not present

## 2018-11-29 DIAGNOSIS — M542 Cervicalgia: Secondary | ICD-10-CM | POA: Diagnosis not present

## 2018-11-29 DIAGNOSIS — M25611 Stiffness of right shoulder, not elsewhere classified: Secondary | ICD-10-CM | POA: Diagnosis not present

## 2018-12-03 DIAGNOSIS — M6281 Muscle weakness (generalized): Secondary | ICD-10-CM | POA: Diagnosis not present

## 2018-12-03 DIAGNOSIS — M25511 Pain in right shoulder: Secondary | ICD-10-CM | POA: Diagnosis not present

## 2018-12-03 DIAGNOSIS — M25611 Stiffness of right shoulder, not elsewhere classified: Secondary | ICD-10-CM | POA: Diagnosis not present

## 2018-12-03 DIAGNOSIS — M542 Cervicalgia: Secondary | ICD-10-CM | POA: Diagnosis not present

## 2018-12-06 ENCOUNTER — Ambulatory Visit (INDEPENDENT_AMBULATORY_CARE_PROVIDER_SITE_OTHER): Payer: BC Managed Care – PPO

## 2018-12-06 ENCOUNTER — Encounter: Payer: Self-pay | Admitting: Family Medicine

## 2018-12-06 DIAGNOSIS — Z23 Encounter for immunization: Secondary | ICD-10-CM

## 2018-12-09 DIAGNOSIS — M25511 Pain in right shoulder: Secondary | ICD-10-CM | POA: Diagnosis not present

## 2018-12-09 DIAGNOSIS — M542 Cervicalgia: Secondary | ICD-10-CM | POA: Diagnosis not present

## 2018-12-09 DIAGNOSIS — M25611 Stiffness of right shoulder, not elsewhere classified: Secondary | ICD-10-CM | POA: Diagnosis not present

## 2018-12-09 DIAGNOSIS — M6281 Muscle weakness (generalized): Secondary | ICD-10-CM | POA: Diagnosis not present

## 2018-12-13 DIAGNOSIS — M6281 Muscle weakness (generalized): Secondary | ICD-10-CM | POA: Diagnosis not present

## 2018-12-13 DIAGNOSIS — M25611 Stiffness of right shoulder, not elsewhere classified: Secondary | ICD-10-CM | POA: Diagnosis not present

## 2018-12-13 DIAGNOSIS — M542 Cervicalgia: Secondary | ICD-10-CM | POA: Diagnosis not present

## 2018-12-13 DIAGNOSIS — M25511 Pain in right shoulder: Secondary | ICD-10-CM | POA: Diagnosis not present

## 2018-12-16 DIAGNOSIS — M25511 Pain in right shoulder: Secondary | ICD-10-CM | POA: Diagnosis not present

## 2018-12-16 DIAGNOSIS — M6281 Muscle weakness (generalized): Secondary | ICD-10-CM | POA: Diagnosis not present

## 2018-12-16 DIAGNOSIS — M25611 Stiffness of right shoulder, not elsewhere classified: Secondary | ICD-10-CM | POA: Diagnosis not present

## 2018-12-16 DIAGNOSIS — M542 Cervicalgia: Secondary | ICD-10-CM | POA: Diagnosis not present

## 2019-01-03 DIAGNOSIS — R6882 Decreased libido: Secondary | ICD-10-CM | POA: Diagnosis not present

## 2019-01-03 DIAGNOSIS — M2550 Pain in unspecified joint: Secondary | ICD-10-CM | POA: Diagnosis not present

## 2019-01-03 DIAGNOSIS — E291 Testicular hypofunction: Secondary | ICD-10-CM | POA: Diagnosis not present

## 2019-01-10 DIAGNOSIS — M25511 Pain in right shoulder: Secondary | ICD-10-CM | POA: Diagnosis not present

## 2019-01-10 DIAGNOSIS — M25611 Stiffness of right shoulder, not elsewhere classified: Secondary | ICD-10-CM | POA: Diagnosis not present

## 2019-01-10 DIAGNOSIS — Z72820 Sleep deprivation: Secondary | ICD-10-CM | POA: Diagnosis not present

## 2019-01-10 DIAGNOSIS — M542 Cervicalgia: Secondary | ICD-10-CM | POA: Diagnosis not present

## 2019-01-10 DIAGNOSIS — R6882 Decreased libido: Secondary | ICD-10-CM | POA: Diagnosis not present

## 2019-01-10 DIAGNOSIS — L709 Acne, unspecified: Secondary | ICD-10-CM | POA: Diagnosis not present

## 2019-01-10 DIAGNOSIS — E291 Testicular hypofunction: Secondary | ICD-10-CM | POA: Diagnosis not present

## 2019-02-14 DIAGNOSIS — H53143 Visual discomfort, bilateral: Secondary | ICD-10-CM | POA: Diagnosis not present

## 2019-05-03 ENCOUNTER — Other Ambulatory Visit: Payer: Self-pay | Admitting: Family Medicine

## 2019-05-09 ENCOUNTER — Other Ambulatory Visit: Payer: Self-pay | Admitting: Orthopedic Surgery

## 2019-05-09 DIAGNOSIS — R6882 Decreased libido: Secondary | ICD-10-CM | POA: Diagnosis not present

## 2019-05-09 DIAGNOSIS — M542 Cervicalgia: Secondary | ICD-10-CM

## 2019-05-09 DIAGNOSIS — E291 Testicular hypofunction: Secondary | ICD-10-CM | POA: Diagnosis not present

## 2019-05-09 DIAGNOSIS — Z72820 Sleep deprivation: Secondary | ICD-10-CM | POA: Diagnosis not present

## 2019-05-12 DIAGNOSIS — R5382 Chronic fatigue, unspecified: Secondary | ICD-10-CM | POA: Diagnosis not present

## 2019-05-12 DIAGNOSIS — R6882 Decreased libido: Secondary | ICD-10-CM | POA: Diagnosis not present

## 2019-05-12 DIAGNOSIS — E291 Testicular hypofunction: Secondary | ICD-10-CM | POA: Diagnosis not present

## 2019-05-30 ENCOUNTER — Ambulatory Visit
Admission: RE | Admit: 2019-05-30 | Discharge: 2019-05-30 | Disposition: A | Discharge status: Home / Self Care | Payer: BC Managed Care – PPO | Source: Ambulatory Visit | Attending: Orthopedic Surgery | Admitting: Orthopedic Surgery

## 2019-05-30 DIAGNOSIS — M542 Cervicalgia: Secondary | ICD-10-CM

## 2019-05-30 IMAGING — MR MR CERVICAL SPINE W/O CM
4 of 5 series · 27 of 48 positions shown · non-contrast
Comparison: None.

CLINICAL DATA: Neck pain

EXAM:
MRI CERVICAL SPINE WITHOUT CONTRAST
TECHNIQUE: Multiplanar, multisequence MR imaging of the cervical spine was
performed. No intravenous contrast was administered.

[Series 5: T1 · sagittal · 3.0mm · 0.41mm/px · 6 of 13 slices shown]
[im 1/13]
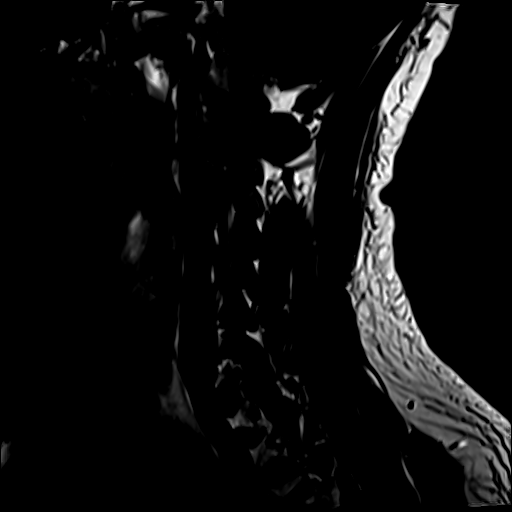
[im 3/13]
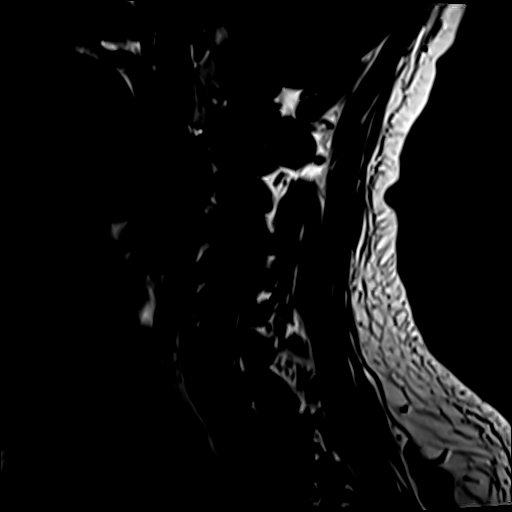
[im 5/13]
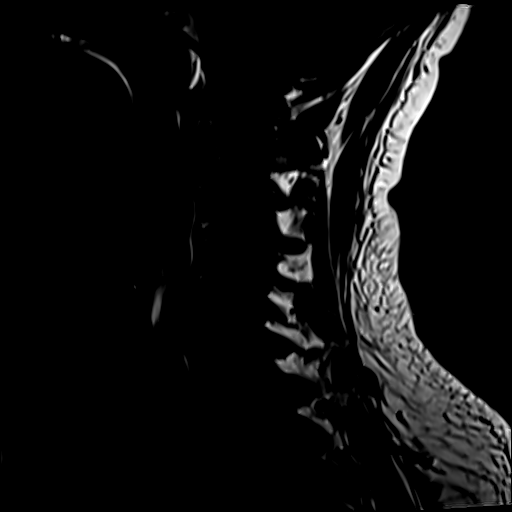
[im 8/13]
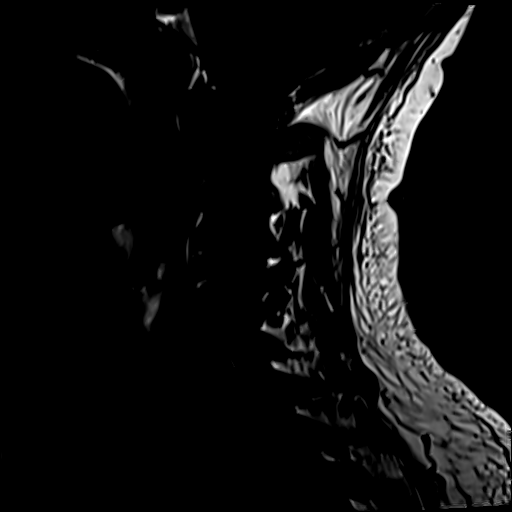
[im 10/13]
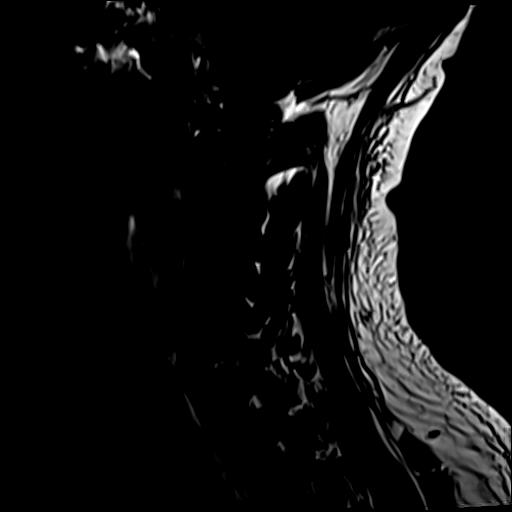
[im 13/13]
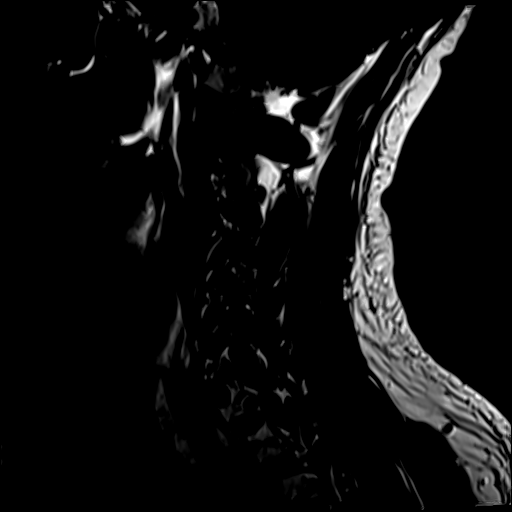

[Series 6: T2 · sagittal · 3.0mm · 0.55mm/px · 6 of 13 slices shown (1 of 2)]
[im 1/13]
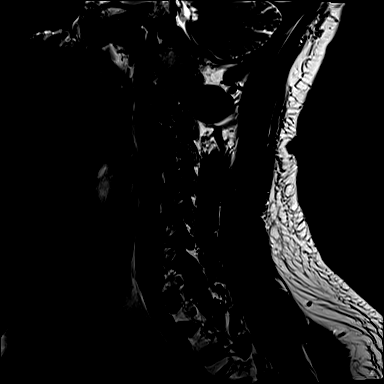
[im 3/13]
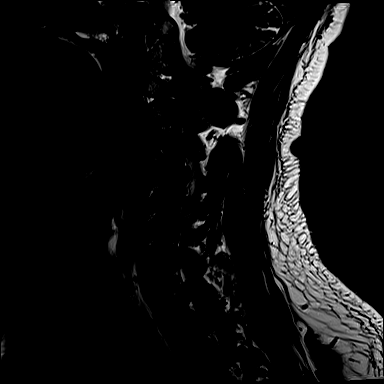
[im 5/13]
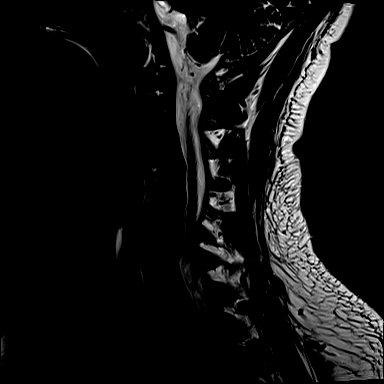
[im 8/13]
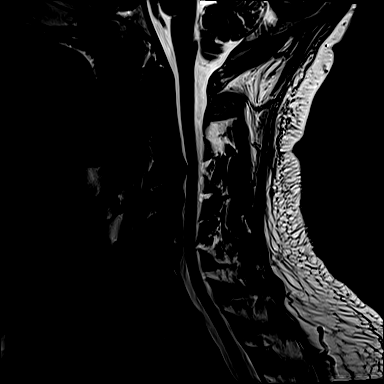
[im 10/13]
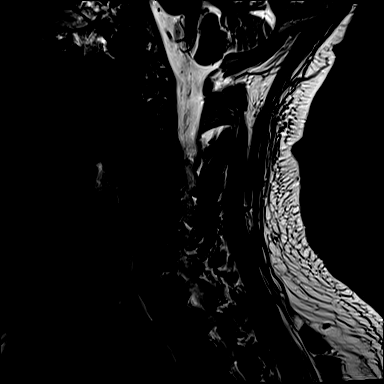
[im 13/13]
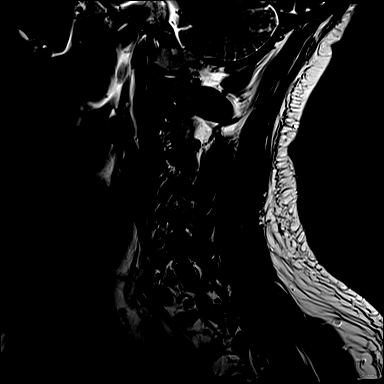

[Series 7: STIR · sagittal · 3.0mm · 0.41mm/px · 6 of 13 slices shown]
[im 1/13]
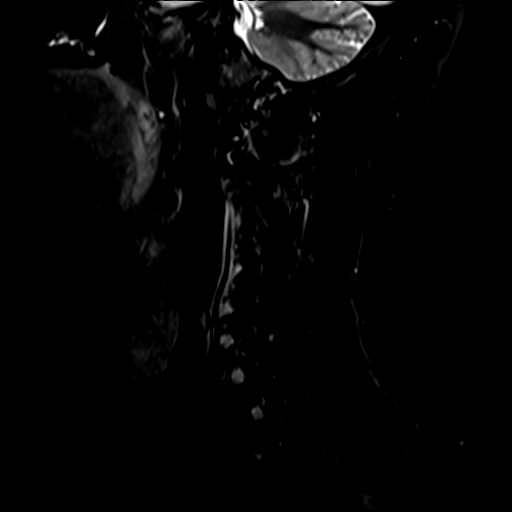
[im 3/13]
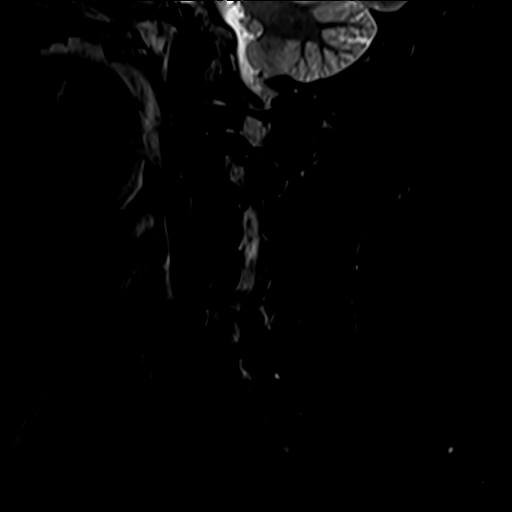
[im 5/13]
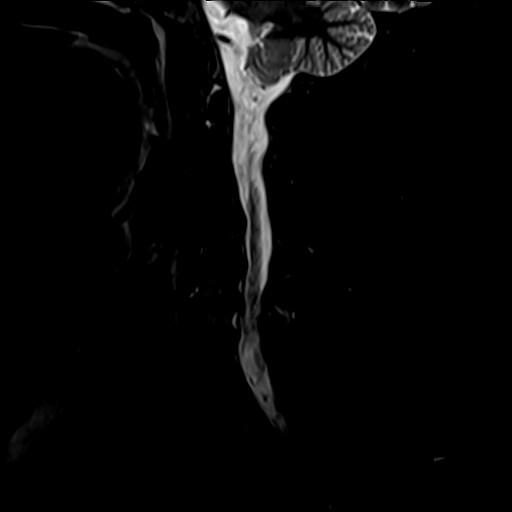
[im 8/13]
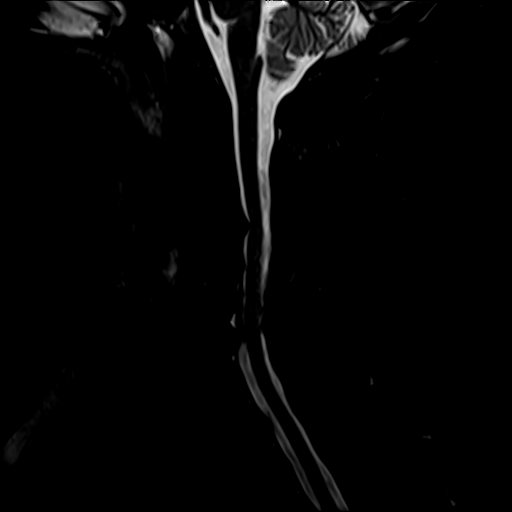
[im 10/13]
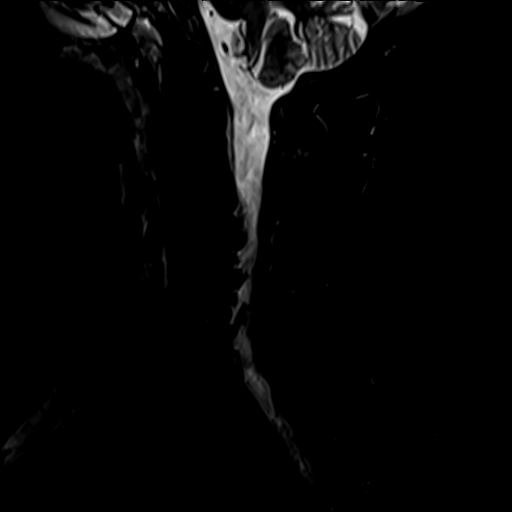
[im 13/13]
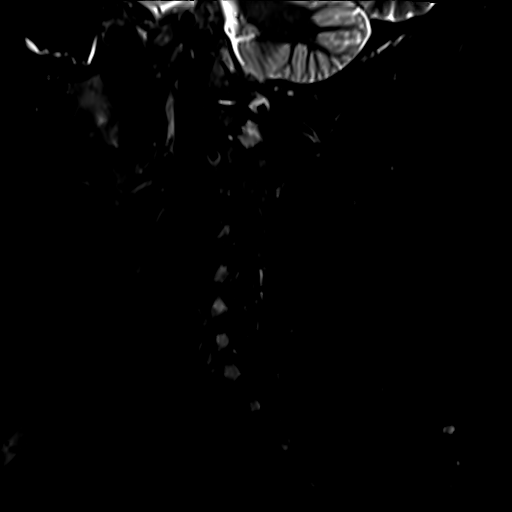

[Series 8: T2 · axial · 3.0mm · 0.50mm/px · z∈[-58,+49]mm · 9 of 30 slices shown (2 of 2)]
[im 1/30]
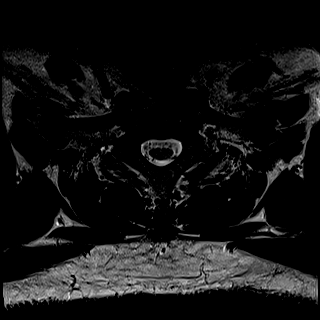
[im 5/30]
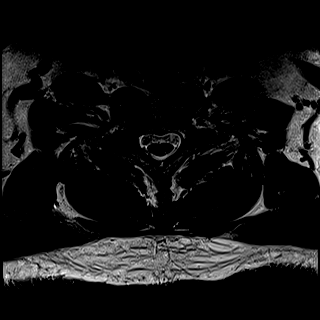
[im 9/30]
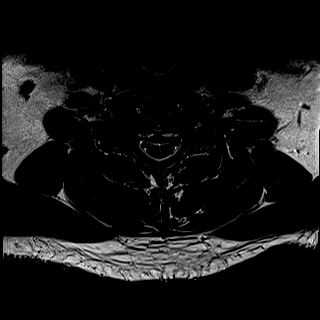
[im 13/30]
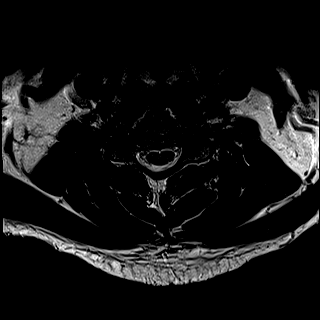
[im 15/30]
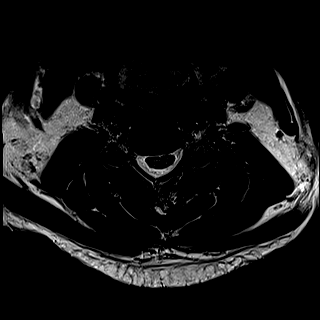
[im 17/30]
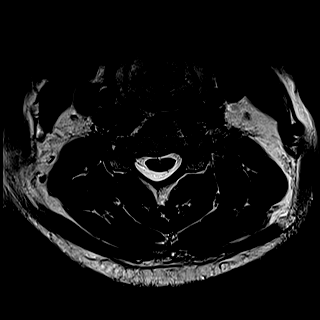
[im 21/30]
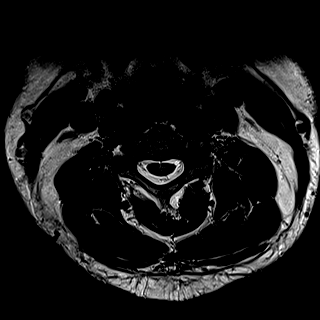
[im 25/30]
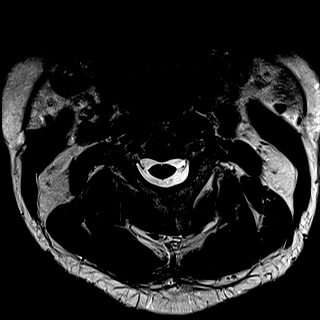
[im 30/30]
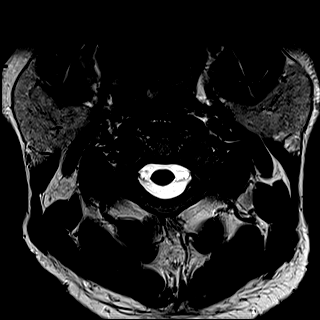

[27 of 48 positions shown; findings below may reference images not displayed]

FINDINGS: Alignment: Reversal of the cervical lordosis. Trace retrolisthesis
at C4-C5 and C5-C6.

Vertebrae: Vertebral body heights are maintained apart from minor
degenerative endplate irregularity. There is no substantial marrow
edema or suspicious osseous lesion.

Cord: No abnormal signal.

Posterior Fossa, vertebral arteries, paraspinal tissues:
Unremarkable.

Disc levels:

C2-C3: Left facet hypertrophy with joint fusion. No significant
canal or foraminal stenosis.

C3-C4: Disc bulge, uncovertebral hypertrophy, and left greater than
right facet hypertrophy. No significant canal or right foraminal
stenosis. Mild left foraminal stenosis.

C4-C5: Disc bulge, endplate osteophytes, and uncovertebral and facet
hypertrophy. No significant canal or foraminal stenosis.

C5-C6: Disc bulge, endplate osteophytes, and uncovertebral and facet
hypertrophy. Minor canal and foraminal stenosis.

C6-C7: Disc bulge with superimposed central protrusion, endplate
osteophytes, and uncovertebral and facet hypertrophy. Minor canal
stenosis. Mild foraminal stenosis.

C7-T1:  No canal or foraminal stenosis.
IMPRESSION: Mild multilevel degenerative changes as detailed above without
high-grade stenosis.

## 2019-06-13 ENCOUNTER — Other Ambulatory Visit: Payer: Self-pay | Admitting: Family Medicine

## 2019-09-05 DIAGNOSIS — R6882 Decreased libido: Secondary | ICD-10-CM | POA: Diagnosis not present

## 2019-09-05 DIAGNOSIS — R5382 Chronic fatigue, unspecified: Secondary | ICD-10-CM | POA: Diagnosis not present

## 2019-09-05 DIAGNOSIS — E291 Testicular hypofunction: Secondary | ICD-10-CM | POA: Diagnosis not present

## 2019-10-27 NOTE — Progress Notes (Deleted)
  Phone [phone] In person visit   Subjective:   Jason Lopez is a 59 y.o. year old very pleasant male patient who presents for/with See problem oriented charting No chief complaint on file.   This visit occurred during the SARS-CoV-2 public health emergency.  Safety protocols were in place, including screening questions prior to the visit, additional usage of staff PPE, and extensive cleaning of exam room while observing appropriate contact time as indicated for disinfecting solutions.   Past Medical History-  Patient Active Problem List   Diagnosis Date Noted  . RUQ abdominal pain 05/14/2015  . Diabetes mellitus (HCC) 12/27/2014  . Tendinopathy of right rotator cuff 12/27/2014  . Obesity 04/17/2014  . Seborrheic dermatitis 04/17/2014  . Major depression, recurrent, full remission (HCC) 04/22/2010  . PLANTAR FASCIITIS 01/21/2010  . RADIAL NERVE INJURY 01/21/2010  . Hypogonadism in male 01/08/2010  . ERECTILE DYSFUNCTION, MILD 08/02/2007  . Hyperlipidemia associated with type 2 diabetes mellitus (HCC) 12/09/2006  . Hypertension associated with diabetes (HCC) 12/09/2006  . Allergic rhinitis 12/09/2006  . GERD 12/09/2006  . History of colonic polyps 12/09/2006    Medications- reviewed and updated Current Outpatient Medications  Medication Sig Dispense Refill  . anastrozole (ARIMIDEX) 1 MG tablet Take 1 tablet by mouth once a week.  0  . DULoxetine (CYMBALTA) 60 MG capsule TAKE ONE CAPSULE EACH DAY 90 capsule 1  . esomeprazole (NEXIUM) 20 MG capsule Take 40 mg by mouth daily at 12 noon. OTC    . fluticasone (FLONASE) 50 MCG/ACT nasal spray PLACE 2 SPRAYS INTO THE NOSE DAILY. 16 g 2  . lisinopril-hydrochlorothiazide (ZESTORETIC) 20-25 MG tablet TAKE ONE TABLET DAILY 90 tablet 3  . Multiple Vitamin (MULTIVITAMIN) tablet Take 1 tablet by mouth daily.      Marland Kitchen spironolactone (ALDACTONE) 25 MG tablet Take 25 mg by mouth daily.   0   No current facility-administered medications  for this visit.     Objective:  There were no vitals taken for this visit. Gen: NAD, resting comfortably CV: RRR no murmurs rubs or gallops Lungs: CTAB no crackles, wheeze, rhonchi Abdomen: soft/nontender/nondistended/normal bowel sounds. No rebound or guarding.  Ext: no edema Skin: warm, dry Neuro: grossly normal, moves all extremities  ***    Assessment and Plan   # Diabetes S: Medication:***  CBGs- *** Exercise and diet- *** Lab Results  Component Value Date   HGBA1C 6.7 (H) 05/31/2018   HGBA1C 6.6 (H) 01/04/2018   HGBA1C 5.9 09/24/2016    A/P: ***   No specialty comments available.  No problem-specific Assessment & Plan notes found for this encounter.   Recommended follow up: ***No follow-ups on file. Future Appointments  Date Time Provider Department Center  10/31/2019  1:00 PM Shelva Majestic, MD [facility]    Lab/Order associations:   ICD-10-CM   1. Type 2 diabetes mellitus without complication, without long-term current use of insulin (HCC)  E11.9     No orders of the defined types were placed in this encounter.   Time Spent: *** minutes of total time (11:13 AM***- 11:13 AM***) was spent on the date of the encounter performing the following actions: chart review prior to seeing the patient, obtaining history, performing a medically necessary exam, counseling on the treatment plan, placing orders, and documenting in our EHR.   Return precautions advised.  Lieutenant Diego, CMA

## 2019-10-31 ENCOUNTER — Ambulatory Visit: Payer: BC Managed Care – PPO | Admitting: Family Medicine

## 2019-10-31 ENCOUNTER — Other Ambulatory Visit: Payer: Self-pay | Admitting: Family Medicine

## 2019-11-23 DIAGNOSIS — E291 Testicular hypofunction: Secondary | ICD-10-CM | POA: Diagnosis not present

## 2019-11-28 DIAGNOSIS — E559 Vitamin D deficiency, unspecified: Secondary | ICD-10-CM | POA: Diagnosis not present

## 2019-11-28 DIAGNOSIS — E291 Testicular hypofunction: Secondary | ICD-10-CM | POA: Diagnosis not present

## 2019-11-28 DIAGNOSIS — Z6834 Body mass index (BMI) 34.0-34.9, adult: Secondary | ICD-10-CM | POA: Diagnosis not present

## 2019-11-28 DIAGNOSIS — N529 Male erectile dysfunction, unspecified: Secondary | ICD-10-CM | POA: Diagnosis not present

## 2019-12-05 ENCOUNTER — Other Ambulatory Visit: Payer: Self-pay

## 2019-12-05 ENCOUNTER — Ambulatory Visit (INDEPENDENT_AMBULATORY_CARE_PROVIDER_SITE_OTHER): Payer: BC Managed Care – PPO | Admitting: Family Medicine

## 2019-12-05 ENCOUNTER — Encounter: Payer: Self-pay | Admitting: Family Medicine

## 2019-12-05 VITALS — BP 122/64 | HR 78 | Temp 98.3°F | Resp 18 | Ht 75.0 in | Wt 256.2 lb

## 2019-12-05 DIAGNOSIS — E1159 Type 2 diabetes mellitus with other circulatory complications: Secondary | ICD-10-CM

## 2019-12-05 DIAGNOSIS — E119 Type 2 diabetes mellitus without complications: Secondary | ICD-10-CM

## 2019-12-05 DIAGNOSIS — G720 Drug-induced myopathy: Secondary | ICD-10-CM | POA: Insufficient documentation

## 2019-12-05 DIAGNOSIS — Z79899 Other long term (current) drug therapy: Secondary | ICD-10-CM | POA: Diagnosis not present

## 2019-12-05 DIAGNOSIS — E785 Hyperlipidemia, unspecified: Secondary | ICD-10-CM

## 2019-12-05 DIAGNOSIS — F3342 Major depressive disorder, recurrent, in full remission: Secondary | ICD-10-CM

## 2019-12-05 DIAGNOSIS — Z23 Encounter for immunization: Secondary | ICD-10-CM | POA: Diagnosis not present

## 2019-12-05 DIAGNOSIS — T466X5A Adverse effect of antihyperlipidemic and antiarteriosclerotic drugs, initial encounter: Secondary | ICD-10-CM

## 2019-12-05 DIAGNOSIS — M791 Myalgia, unspecified site: Secondary | ICD-10-CM | POA: Diagnosis not present

## 2019-12-05 DIAGNOSIS — E1169 Type 2 diabetes mellitus with other specified complication: Secondary | ICD-10-CM | POA: Diagnosis not present

## 2019-12-05 DIAGNOSIS — I152 Hypertension secondary to endocrine disorders: Secondary | ICD-10-CM

## 2019-12-05 DIAGNOSIS — I10 Essential (primary) hypertension: Secondary | ICD-10-CM | POA: Diagnosis not present

## 2019-12-05 MED ORDER — SULFAMETHOXAZOLE-TRIMETHOPRIM 800-160 MG PO TABS
1.0000 | ORAL_TABLET | Freq: Two times a day (BID) | ORAL | 0 refills | Status: DC
Start: 2019-12-05 — End: 2020-04-23

## 2019-12-05 NOTE — Progress Notes (Signed)
Phone [phone] In person visit   Subjective:   Jason Lopez is a 59 y.o. year old very pleasant male patient who presents for/with See problem oriented charting Chief Complaint  Patient presents with  . Diabetes    This visit occurred during the SARS-CoV-2 public health emergency.  Safety protocols were in place, including screening questions prior to the visit, additional usage of staff PPE, and extensive cleaning of exam room while observing appropriate contact time as indicated for disinfecting solutions.   Past Medical History-  Patient Active Problem List   Diagnosis Date Noted  . Diabetes mellitus (HCC) 12/27/2014    Priority: High  . Myalgia due to statin 12/05/2019    Priority: Medium  . Obesity 04/17/2014    Priority: Medium  . Major depression, recurrent, full remission (HCC) 04/22/2010    Priority: Medium  . Hypogonadism in male 01/08/2010    Priority: Medium  . ERECTILE DYSFUNCTION, MILD 08/02/2007    Priority: Medium  . Hyperlipidemia associated with type 2 diabetes mellitus (HCC) 12/09/2006    Priority: Medium  . Hypertension associated with diabetes (HCC) 12/09/2006    Priority: Medium  . Seborrheic dermatitis 04/17/2014    Priority: Low  . PLANTAR FASCIITIS 01/21/2010    Priority: Low  . RADIAL NERVE INJURY 01/21/2010    Priority: Low  . Allergic rhinitis 12/09/2006    Priority: Low  . GERD 12/09/2006    Priority: Low  . History of colonic polyps 12/09/2006    Priority: Low  . RUQ abdominal pain 05/14/2015  . Tendinopathy of right rotator cuff 12/27/2014    Medications- reviewed and updated Current Outpatient Medications  Medication Sig Dispense Refill  . anastrozole (ARIMIDEX) 1 MG tablet Take 1 tablet by mouth. Every other week  0  . DULoxetine (CYMBALTA) 60 MG capsule TAKE ONE CAPSULE EACH DAY 90 capsule 1  . esomeprazole (NEXIUM) 20 MG capsule Take 40 mg by mouth daily at 12 noon. OTC    . fluticasone (FLONASE) 50 MCG/ACT nasal spray  PLACE 2 SPRAYS INTO THE NOSE DAILY. 16 g 2  . lisinopril-hydrochlorothiazide (ZESTORETIC) 20-25 MG tablet TAKE ONE TABLET DAILY 90 tablet 3  . Multiple Vitamin (MULTIVITAMIN) tablet Take 1 tablet by mouth daily.      Marland Kitchen spironolactone (ALDACTONE) 25 MG tablet Take 25 mg by mouth daily.   0  . sulfamethoxazole-trimethoprim (BACTRIM DS) 800-160 MG tablet Take 1 tablet by mouth 2 (two) times daily. 10 tablet 0   No current facility-administered medications for this visit.     Objective:  BP 122/64   Pulse 78   Temp 98.3 F (36.8 C) (Temporal)   Resp 18   Ht 6\' 3"  (1.905 m)   Wt 256 lb 3.2 oz (116.2 kg)   SpO2 97%   BMI 32.02 kg/m  Gen: NAD, resting comfortably CV: RRR no murmurs rubs or gallops Lungs: CTAB no crackles, wheeze, rhonchi Ext: no edema Skin: warm, dry, erythema, tenderness over left hip- attempted to remove 1 suture- both sides of suture came then large volume of pus- could not clearly identify that all suture came out but suspect it came out with purulent discharge. 10 minutes spent providing pressure to remove more purulence. Improvement in tenderness and erythema afterwards    Assessment and Plan  #Left Hip stitch removal after testosterone pellet placement S: Patient stated that his had Testerone pellets placed in his left hip and wanted to get them stitch removed if possible. Has noted more tenderness than  usual  Also on arimiedexA/P: Removed 1 suture-suture was rather tight and surrounding area was somewhat erythematous-after suture was removed a significant amount of purulent material drained from the area including at least 25 small white pellets-concerning these may be the testosterone pellets.  It appears an abscess developed in the area underneath the suture.  When I remove the suture it was rather tight and it appears clipped both sides of the suture-may have a small amount of remaining suture- also this may have come out with the purulent material. I do think we  need to place him on an antibiotic-[place] is headed to the beach so we will use Bactrim instead of doxycycline.  If he has expanding redness or worsening symptoms he should let us know.  I also want him to let [facility] know about this  #Joint Pain S: Patient mentioned that he has been helping his mother sale her house so he has moved a lot of her stuff. Baseline arthritic issues but also feels like worse after his covid 19 injection #2- would still recommend booster at 6 months due to risk factors including diabetes and obesity.  A/P: patient states shoulders in particular bothering him- he is going to call Dr. Dion Saucier for follow up  # Diabetes S: Medication: none  CBGs- does not check- does not have meter declines for now Exercise and diet- 3 months at the gym back working out. Down 4 lbs from march. Cut down on alcohol to cut down on sugar. Also tries to cut down on bread/s potatoes for most part Lab Results  Component Value Date   HGBA1C 6.7 (H) 05/31/2018   HGBA1C 6.6 (H) 01/04/2018   HGBA1C 5.9 09/24/2016    A/P: hopefully controlled update a1c  #hypertension S: medication: lisinopril and hctz 20-25mg , spironolactone 25 mg BP Readings from Last 3 Encounters:  12/05/19 122/64  05/31/18 138/80  01/04/18 128/76  A/P: Stable. Continue current medications.   # Depression S: Medication:cymbalta  Depression screen [facility] 2/9 12/05/2019 10/04/2018 05/31/2018  Decreased Interest 0 1 1  Down, Depressed, Hopeless 0 1 0  PHQ - 2 Score 0 2 1  Altered sleeping 0 0 0  Tired, decreased energy 0 0 0  Change in appetite 0 0 1  Feeling bad or failure about yourself  0 1 0  Trouble concentrating 0 1 1  Moving slowly or fidgety/restless 0 0 0  Suicidal thoughts 0 0 0  PHQ-9 Score 0 4 3  Difficult doing work/chores Not difficult at all Not difficult at all Not difficult at all   A/P: reasonably well controlled/full remission- continue current meds  #hyperlipidemia/myalgia on statin S:  Medication:vytorin in past a few times a week. crestor with joint pain.  Lab Results  Component Value Date   CHOL 242 (H) 01/04/2018   HDL 51.90 01/04/2018   LDLCALC 160 (H) 01/04/2018   LDLDIRECT 141.8 08/02/2012   TRIG 153.0 (H) 01/04/2018   CHOLHDL 5 01/04/2018   A/P:  Update lipids- likely will need to start statin- consider once a week atorvastatin with prior issues.   # umbilical hernia noted on exam at end of visit- reducible- declines surgery consult for now  Recommended follow up: Return in about 6 months (around 06/03/2020) for physical or sooner if needed.  Lab/Order associations:   ICD-10-CM   1. Type 2 diabetes mellitus without complication, without long-term current use of insulin (HCC)  E11.9 CBC With Differential/Platelet    COMPLETE METABOLIC PANEL WITH GFR  Lipid Panel (Refl)    Hemoglobin A1c  2. Hypertension associated with diabetes (HCC)  E11.59 CBC With Differential/Platelet   I10 COMPLETE METABOLIC PANEL WITH GFR    Lipid Panel (Refl)  3. Hyperlipidemia associated with type 2 diabetes mellitus (HCC)  E11.69 CBC With Differential/Platelet   E78.5 COMPLETE METABOLIC PANEL WITH GFR    Lipid Panel (Refl)  4. High risk medication use  Z79.899 PSA    Vitamin B12  5. Myalgia due to statin  M79.10    T46.6X5A     Meds ordered this encounter  Medications  . sulfamethoxazole-trimethoprim (BACTRIM DS) 800-160 MG tablet    Sig: Take 1 tablet by mouth 2 (two) times daily.    Dispense:  10 tablet    Refill:  0   Return precautions advised.  Tana Conch, MD

## 2019-12-05 NOTE — Patient Instructions (Addendum)
Health Maintenance Due  Topic Date Due  . PNEUMOCOCCAL POLYSACCHARIDE VACCINE AGE 57-64 HIGH RISK - consider next visit Never done  . INFLUENZA VACCINE In office flu shot 10/16/2019   Bactrim for abscess at injection site. Please [facility]. If new or worsening symptoms return to see [place]  Recommended follow up: Return in about 6 months (around 06/03/2020) for physical or sooner if needed.

## 2019-12-05 NOTE — Addendum Note (Signed)
Addended by: Manuela Schwartz on: 12/05/2019 11:32 AM   Modules accepted: Orders

## 2019-12-06 LAB — COMPLETE METABOLIC PANEL WITH GFR
AG Ratio: 1.6 (calc) (ref 1.0–2.5)
ALT: 31 U/L (ref 9–46)
AST: 21 U/L (ref 10–35)
Albumin: 4.3 g/dL (ref 3.6–5.1)
Alkaline phosphatase (APISO): 76 U/L (ref 35–144)
BUN: 10 mg/dL (ref 7–25)
CO2: 31 mmol/L (ref 20–32)
Calcium: 9.8 mg/dL (ref 8.6–10.3)
Chloride: 95 mmol/L — ABNORMAL LOW (ref 98–110)
Creat: 0.92 mg/dL (ref 0.70–1.33)
GFR, Est African American: 107 mL/min/{1.73_m2} (ref >=60)
GFR, Est Non African American: 92 mL/min/{1.73_m2} (ref >=60)
Globulin: 2.7 g/dL (calc) (ref 1.9–3.7)
Glucose, Bld: 284 mg/dL — ABNORMAL HIGH (ref 65–99)
Potassium: 4.1 mmol/L (ref 3.5–5.3)
Sodium: 135 mmol/L (ref 135–146)
Total Bilirubin: 0.4 mg/dL (ref 0.2–1.2)
Total Protein: 7 g/dL (ref 6.1–8.1)

## 2019-12-06 LAB — LIPID PANEL (REFL)
Cholesterol: 211 mg/dL — ABNORMAL HIGH (ref <200)
HDL: 46 mg/dL (ref >=40)
LDL Cholesterol (Calc): 126 mg/dL (calc) — ABNORMAL HIGH
Non-HDL Cholesterol (Calc): 165 mg/dL (calc) — ABNORMAL HIGH (ref <130)
Total CHOL/HDL Ratio: 4.6 (calc) (ref <5.0)
Triglycerides: 244 mg/dL — ABNORMAL HIGH (ref <150)

## 2019-12-06 LAB — CBC WITH DIFFERENTIAL/PLATELET
Absolute Monocytes: 608 cells/uL (ref 200–950)
Basophils Absolute: 61 cells/uL (ref 0–200)
Basophils Relative: 0.8 %
Eosinophils Absolute: 122 cells/uL (ref 15–500)
Eosinophils Relative: 1.6 %
HCT: 48.2 % (ref 38.5–50.0)
Hemoglobin: 15.8 g/dL (ref 13.2–17.1)
Lymphs Abs: 1847 cells/uL (ref 850–3900)
MCH: 26.2 pg — ABNORMAL LOW (ref 27.0–33.0)
MCHC: 32.8 g/dL (ref 32.0–36.0)
MCV: 79.9 fL — ABNORMAL LOW (ref 80.0–100.0)
MPV: 9.9 fL (ref 7.5–12.5)
Monocytes Relative: 8 %
Neutro Abs: 4963 cells/uL (ref 1500–7800)
Neutrophils Relative %: 65.3 %
Platelets: 218 10*3/uL (ref 140–400)
RBC: 6.03 10*6/uL — ABNORMAL HIGH (ref 4.20–5.80)
RDW: 14.5 % (ref 11.0–15.0)
Total Lymphocyte: 24.3 %
WBC: 7.6 10*3/uL (ref 3.8–10.8)

## 2019-12-06 LAB — HEMOGLOBIN A1C
Hgb A1c MFr Bld: 8.9 % of total Hgb — ABNORMAL HIGH (ref <5.7)
Mean Plasma Glucose: 209 (calc)
eAG (mmol/L): 11.6 (calc)

## 2019-12-06 LAB — PSA: PSA: 0.65 ng/mL (ref <=4.0)

## 2019-12-06 LAB — VITAMIN B12: Vitamin B-12: 734 pg/mL (ref 200–1100)

## 2019-12-07 ENCOUNTER — Other Ambulatory Visit: Payer: Self-pay

## 2019-12-07 MED ORDER — METFORMIN HCL 1000 MG PO TABS: 1000.0000 mg | ORAL_TABLET | Freq: Two times a day (BID) | ORAL | 3 refills | Status: DC

## 2019-12-07 MED ORDER — ATORVASTATIN CALCIUM 10 MG PO TABS: 10.0000 mg | ORAL_TABLET | ORAL | 3 refills | Status: DC

## 2020-01-09 DIAGNOSIS — E291 Testicular hypofunction: Secondary | ICD-10-CM | POA: Diagnosis not present

## 2020-01-12 ENCOUNTER — Other Ambulatory Visit: Payer: Self-pay | Admitting: Family Medicine

## 2020-01-16 DIAGNOSIS — F419 Anxiety disorder, unspecified: Secondary | ICD-10-CM | POA: Diagnosis not present

## 2020-01-16 DIAGNOSIS — E291 Testicular hypofunction: Secondary | ICD-10-CM | POA: Diagnosis not present

## 2020-01-16 DIAGNOSIS — F330 Major depressive disorder, recurrent, mild: Secondary | ICD-10-CM | POA: Diagnosis not present

## 2020-02-06 DIAGNOSIS — M25562 Pain in left knee: Secondary | ICD-10-CM | POA: Diagnosis not present

## 2020-02-06 DIAGNOSIS — M25512 Pain in left shoulder: Secondary | ICD-10-CM | POA: Diagnosis not present

## 2020-02-06 DIAGNOSIS — M25511 Pain in right shoulder: Secondary | ICD-10-CM | POA: Diagnosis not present

## 2020-03-05 DIAGNOSIS — M25562 Pain in left knee: Secondary | ICD-10-CM | POA: Diagnosis not present

## 2020-03-05 DIAGNOSIS — M25512 Pain in left shoulder: Secondary | ICD-10-CM | POA: Diagnosis not present

## 2020-03-05 DIAGNOSIS — M25511 Pain in right shoulder: Secondary | ICD-10-CM | POA: Diagnosis not present

## 2020-04-23 ENCOUNTER — Encounter: Payer: Self-pay | Admitting: Physician Assistant

## 2020-04-23 ENCOUNTER — Other Ambulatory Visit: Payer: Self-pay

## 2020-04-23 ENCOUNTER — Telehealth (INDEPENDENT_AMBULATORY_CARE_PROVIDER_SITE_OTHER): Payer: BC Managed Care – PPO | Admitting: Physician Assistant

## 2020-04-23 DIAGNOSIS — U071 COVID-19: Secondary | ICD-10-CM

## 2020-04-23 NOTE — Patient Instructions (Signed)

## 2020-04-23 NOTE — Progress Notes (Signed)
Virtual Visit via Video Note  I connected with Jason Lopez on 04/23/20 at  1:00 PM EST by a video enabled telemedicine application and verified that I am speaking with the correct person using two identifiers.  Location: Patient: home Provider: office Only the patient and myself were present for today's video call.    I discussed the limitations of evaluation and management by telemedicine and the availability of in person appointments. The patient expressed understanding and agreed to proceed.   History of Present Illness:  Chief complaint: COVID+ on home test 04/22/20 Symptom onset: 04/21/20 with congestion and dry coughing  Pertinent positives: Congestion, dry cough, headache, sinus pressure, fatigue Pertinent negatives: Loss of taste or smell, fever, chest pain, or SOB Treatments tried: Ibuprofen, Tylenol, Claritin-D Vaccine status: Pfizer, plus Booster Sick exposure: COVID EXPOSURE ON 04/18/20, co-worker  Pt has history of Type 2 DM and HTN, depression, hyperlipidemia.   58 yo mother lives with him and is fully vaccinated. She is doing well so far.   Observations/Objective:   Gen: Awake, alert, no acute distress Resp: Breathing is even and non-labored Psych: calm/pleasant demeanor Neuro: Alert and Oriented x 3, + facial symmetry, speech is clear.  Wt Readings from Last 3 Encounters:  12/05/19 256 lb 3.2 oz (116.2 kg)  10/04/18 260 lb (117.9 kg)  05/31/18 260 lb (117.9 kg)     Assessment and Plan:  1. COVID-19 Patient just tested positive for COVID-19 with an at home test last night.  His symptoms seem mild so far.  He does have a history of type 2 diabetes and hypertension.  I am going to refer for the Covid outpatient clinic to see if he will qualify for antibody infusion or other antivirals. He will push fluids, rest, deep breathing at home. He will try plain Mucinex and vitamins. He lives with his elderly mother and has been recommended to isolate at home.  He will  seek emergency treatment should he develop any sudden chest pain or shortness of breath or severe acute changes in his symptoms.  Follow Up Instructions:    I discussed the assessment and treatment plan with the patient. The patient was provided an opportunity to ask questions and all were answered. The patient agreed with the plan and demonstrated an understanding of the instructions.   The patient was advised to call back or seek an in-person evaluation if the symptoms worsen or if the condition fails to improve as anticipated.  Nichalas Coin M Revella Shelton, PA-C

## 2020-04-23 NOTE — Progress Notes (Signed)
I have discussed the procedure for the virtual visit with the patient who has given consent to proceed with assessment and treatment.   Jaslin Novitski S Aleksandar Duve, CMA     

## 2020-04-24 ENCOUNTER — Telehealth (HOSPITAL_COMMUNITY): Payer: Self-pay | Admitting: Pharmacist

## 2020-04-24 ENCOUNTER — Ambulatory Visit (HOSPITAL_COMMUNITY)
Admission: RE | Admit: 2020-04-24 | Discharge: 2020-04-24 | Disposition: A | Discharge status: Home / Self Care | Payer: BC Managed Care – PPO | Source: Ambulatory Visit | Attending: Pulmonary Disease | Admitting: Pulmonary Disease

## 2020-04-24 ENCOUNTER — Other Ambulatory Visit: Payer: Self-pay | Admitting: Adult Health

## 2020-04-24 ENCOUNTER — Telehealth: Payer: Self-pay | Admitting: Family

## 2020-04-24 DIAGNOSIS — U071 COVID-19: Secondary | ICD-10-CM

## 2020-04-24 LAB — BASIC METABOLIC PANEL
Anion gap: 11 (ref 5–15)
BUN: 16 mg/dL (ref 6–20)
CO2: 31 mmol/L (ref 22–32)
Calcium: 9.4 mg/dL (ref 8.9–10.3)
Chloride: 97 mmol/L — ABNORMAL LOW (ref 98–111)
Creatinine, Ser: 0.85 mg/dL (ref 0.61–1.24)
GFR, Estimated: >60 mL/min (ref >60)
Glucose, Bld: 126 mg/dL — ABNORMAL HIGH (ref 70–99)
Potassium: 4.1 mmol/L (ref 3.5–5.1)
Sodium: 139 mmol/L (ref 135–145)

## 2020-04-24 MED ORDER — NIRMATRELVIR/RITONAVIR (PAXLOVID)TABLET: 3.0000 | ORAL_TABLET | Freq: Two times a day (BID) | ORAL | 0 refills | Status: AC

## 2020-04-24 MED ORDER — NIRMATRELVIR/RITONAVIR (PAXLOVID)TABLET: 3.0000 | ORAL_TABLET | Freq: Two times a day (BID) | ORAL | 0 refills | Status: DC

## 2020-04-24 MED FILL — PAXLOVID 20 X 150 MG & 10 X: 20 X 150 MG | 5 days supply | Qty: 30 | Fill #0

## 2020-04-24 NOTE — Telephone Encounter (Signed)
Patient was prescribed oral covid treatment paxlovid and treatment note was reviewed. Medication has been received by [facility] and reviewed for appropriateness.  Drug Interactions or Dosage Adjustments Noted: No dosage adjustment for kidney function. Pt will hold atorvastatin and fluoxetine for the duration of paxlovid therapy  Delivery Method: pickup  Patient contacted for counseling on 04/24/2020 and verbalized understanding.   Delivery or Pick-Up Date: 04/24/2020   Rita Ohara 04/24/2020, 5:43 PM [facility] Outpatient Pharmacist Phone# [phone]

## 2020-04-24 NOTE — Progress Notes (Signed)
Outpatient Oral COVID Treatment Note  I connected with Jason Lopez on 04/24/2020/2:57 PM by telephone and verified that I am speaking with the correct person using two identifiers.  I discussed the limitations, risks, security, and privacy concerns of performing an evaluation and management service by telephone and the availability of in person appointments. I also discussed with the patient that there may be a patient responsible charge related to this service. The patient expressed understanding and agreed to proceed.  Patient location: home Provider location: home  Diagnosis: COVID-19 infection  Purpose of visit: Discussion of potential use of Molnupiravir or Paxlovid, a new treatment for mild to moderate COVID-19 viral infection in non-hospitalized patients.   Subjective: Patient is a 60 y.o. male who has been diagnosed with COVID 19 viral infection.  Their symptoms began on 2/6 with cough/congestion.    Past Medical History:  Diagnosis Date  . COLONIC POLYPS, HX OF 12/09/2006  . ERECTILE DYSFUNCTION, MILD 08/02/2007  . GERD 12/09/2006  . HYPERLIPIDEMIA 12/09/2006  . HYPERTENSION 12/09/2006  . HYPOGONADISM 01/08/2010  . PLANTAR FASCIITIS 01/21/2010  . RADIAL NERVE INJURY 01/21/2010    No Known Allergies   Current Outpatient Medications:  .  anastrozole (ARIMIDEX) 1 MG tablet, Take 1 tablet by mouth. Every other week, Disp: , Rfl: 0 .  atorvastatin (LIPITOR) 10 MG tablet, Take 1 tablet (10 mg total) by mouth once a week., Disp: 13 tablet, Rfl: 3 .  DULoxetine (CYMBALTA) 60 MG capsule, TAKE ONE CAPSULE EACH DAY, Disp: 90 capsule, Rfl: 1 .  esomeprazole (NEXIUM) 20 MG capsule, Take 40 mg by mouth daily at 12 noon. OTC, Disp: , Rfl:  .  fluticasone (FLONASE) 50 MCG/ACT nasal spray, PLACE 2 SPRAYS INTO THE NOSE DAILY., Disp: 16 g, Rfl: 2 .  lisinopril-hydrochlorothiazide (ZESTORETIC) 20-25 MG tablet, TAKE ONE TABLET DAILY, Disp: 90 tablet, Rfl: 3 .  metFORMIN (GLUCOPHAGE) 1000 MG  tablet, Take 1 tablet (1,000 mg total) by mouth 2 (two) times daily with a meal., Disp: 180 tablet, Rfl: 3 .  Multiple Vitamin (MULTIVITAMIN) tablet, Take 1 tablet by mouth daily., Disp: , Rfl:  .  spironolactone (ALDACTONE) 25 MG tablet, TAKE ONE TABLET DAILY, Disp: 90 tablet, Rfl: 2  Objective: Patient appears/sounds tired.  They are in no apparent distress.  Breathing is non labored.  Mood and behavior are normal.  Laboratory Data:  No results found for this or any previous visit (from the past 2160 hour(s)).   Assessment: 60 y.o. male with mild/moderate COVID 19 viral infection diagnosed at high risk for progression to severe COVID 19.  Plan:  This patient is a 60 y.o. male that meets the following criteria for Emergency Use Authorization of: Paxlovid 1. Age >12 yr AND > 40 kg 2. SARS-COV-2 positive test 3. Symptom onset < 5 days 4. Mild-to-moderate COVID disease with high risk for severe progression to hospitalization or death  I have spoken and communicated the following to the patient or parent/caregiver regarding: 1. Paxlovid is an unapproved drug that is authorized for use under an Emergency Use Authorization.  2. There are no adequate, approved, available products for the treatment of COVID-19 in adults who have mild-to-moderate COVID-19 and are at high risk for progressing to severe COVID-19, including hospitalization or death. 3. Other therapeutics are currently authorized. For additional information on all products authorized for treatment or prevention of COVID-19, please see [email or web address]://[email or web address]-[email or web address]  4. There are benefits and risks of taking this treatment as outlined in the "Fact Sheet  for Patients and Caregivers."  5. "Fact Sheet for Patients and Caregivers" was reviewed with patient. A hard copy will be provided to patient from pharmacy prior to the patient receiving  treatment. 6. Patients should continue to self-isolate and use infection control measures (e.g., wear mask, isolate, social distance, avoid sharing personal items, clean and disinfect "high touch" surfaces, and frequent handwashing) according to CDC guidelines.  7. The patient or parent/caregiver has the option to accept or refuse treatment. 8. Patient medication history was reviewed for potential drug interactions:Interaction with home meds: Instructed to hold his weekly lipitor that he takes once a week due again on Saturday; instructed him not to take any further flonase; he was recommended to keep a close eye on his blood sugars.   9. Patient's GFR is pending.  Will send in normal dose, and have pharmacy hold pending labs.  His most recent GFRs have been normal.     After reviewing above information with the patient, the patient agrees to receive Paxlovid.  Follow up instructions:    . Take prescription BID x 5 days as directed . Reach out to pharmacist for counseling on medication if desired . For concerns regarding further COVID symptoms please follow up with your PCP or urgent care . For urgent or life-threatening issues, seek care at your local emergency department  The patient was provided an opportunity to ask questions, and all were answered. The patient agreed with the plan and demonstrated an understanding of the instructions.   Script sent to [facility] and opted to pick up RX.  The patient was advised to call their PCP or seek an in-person evaluation if the symptoms worsen or if the condition fails to improve as anticipated.   I provided 20  minutes of non face-to-face telephone visit time during this encounter, and > 50% was spent counseling as documented under my assessment & plan.  Noreene Filbert, NP 04/24/2020 /2:57 PM

## 2020-04-24 NOTE — Progress Notes (Signed)
Patient arrived and had lab work completed. Education given that he would be notified when medication was available for pick up at the [facility] long pharmacy

## 2020-04-24 NOTE — Telephone Encounter (Signed)
Called patient and reviewed lab results showing normal renal function. Proceed with Paxlovid dose for normal renal function. Rx sent to [facility] and spoke with pharmacist. Jason Lopez with Jason Lopez and reviewed instructions for picking up his medication. Texted him both the address and phone number to call when he gets there.   Jason Sorrow, NP

## 2020-04-30 ENCOUNTER — Other Ambulatory Visit: Payer: Self-pay | Admitting: Family Medicine

## 2020-04-30 DIAGNOSIS — Z79890 Hormone replacement therapy (postmenopausal): Secondary | ICD-10-CM | POA: Diagnosis not present

## 2020-04-30 DIAGNOSIS — E291 Testicular hypofunction: Secondary | ICD-10-CM | POA: Diagnosis not present

## 2020-04-30 DIAGNOSIS — E559 Vitamin D deficiency, unspecified: Secondary | ICD-10-CM | POA: Diagnosis not present

## 2020-05-07 DIAGNOSIS — E291 Testicular hypofunction: Secondary | ICD-10-CM | POA: Diagnosis not present

## 2020-05-07 DIAGNOSIS — N529 Male erectile dysfunction, unspecified: Secondary | ICD-10-CM | POA: Diagnosis not present

## 2020-05-07 DIAGNOSIS — Z6833 Body mass index (BMI) 33.0-33.9, adult: Secondary | ICD-10-CM | POA: Diagnosis not present

## 2020-05-07 DIAGNOSIS — F419 Anxiety disorder, unspecified: Secondary | ICD-10-CM | POA: Diagnosis not present

## 2020-05-16 DIAGNOSIS — M542 Cervicalgia: Secondary | ICD-10-CM | POA: Diagnosis not present

## 2020-05-16 DIAGNOSIS — M25512 Pain in left shoulder: Secondary | ICD-10-CM | POA: Diagnosis not present

## 2020-05-30 NOTE — Patient Instructions (Signed)
Health Maintenance Due  Topic Date Due  . PNEUMOCOCCAL POLYSACCHARIDE VACCINE AGE 58-64 HIGH RISK  Never done  . FOOT EXAM  Never done  . OPHTHALMOLOGY EXAM  04/17/2020   Depression screen [facility] 2/9 12/05/2019 10/04/2018 05/31/2018  Decreased Interest 0 1 1  Down, Depressed, Hopeless 0 1 0  PHQ - 2 Score 0 2 1  Altered sleeping 0 0 0  Tired, decreased energy 0 0 0  Change in appetite 0 0 1  Feeling bad or failure about yourself  0 1 0  Trouble concentrating 0 1 1  Moving slowly or fidgety/restless 0 0 0  Suicidal thoughts 0 0 0  PHQ-9 Score 0 4 3  Difficult doing work/chores Not difficult at all Not difficult at all Not difficult at all

## 2020-05-30 NOTE — Progress Notes (Signed)
Visit had to be rescheduled

## 2020-06-04 ENCOUNTER — Telehealth: Payer: BC Managed Care – PPO | Admitting: Family Medicine

## 2020-06-04 DIAGNOSIS — E119 Type 2 diabetes mellitus without complications: Secondary | ICD-10-CM

## 2020-06-04 DIAGNOSIS — E1159 Type 2 diabetes mellitus with other circulatory complications: Secondary | ICD-10-CM

## 2020-06-04 DIAGNOSIS — K219 Gastro-esophageal reflux disease without esophagitis: Secondary | ICD-10-CM

## 2020-06-04 DIAGNOSIS — E1169 Type 2 diabetes mellitus with other specified complication: Secondary | ICD-10-CM

## 2020-06-14 ENCOUNTER — Telehealth: Payer: Self-pay

## 2020-06-14 DIAGNOSIS — E119 Type 2 diabetes mellitus without complications: Secondary | ICD-10-CM

## 2020-06-14 DIAGNOSIS — I152 Hypertension secondary to endocrine disorders: Secondary | ICD-10-CM

## 2020-06-14 DIAGNOSIS — E1169 Type 2 diabetes mellitus with other specified complication: Secondary | ICD-10-CM

## 2020-06-14 DIAGNOSIS — E1159 Type 2 diabetes mellitus with other circulatory complications: Secondary | ICD-10-CM

## 2020-06-14 DIAGNOSIS — E785 Hyperlipidemia, unspecified: Secondary | ICD-10-CM

## 2020-06-14 NOTE — Addendum Note (Signed)
Addended by: Lieutenant Diego A on: 06/14/2020 01:13 PM   Modules accepted: Orders

## 2020-06-14 NOTE — Addendum Note (Signed)
Addended by: Lieutenant Diego A on: 06/14/2020 01:12 PM   Modules accepted: Orders

## 2020-06-14 NOTE — Telephone Encounter (Signed)
Labs ordered, please see Pamala Hurry note about scheduling.

## 2020-06-14 NOTE — Telephone Encounter (Signed)
Pt is unable to make his 6 month follow up, due to his mom has 3 appointments that day that he has to take her to. Pt wants to know if he can come get labwork done one morning, then set an appt for the follow up..  Pt is on a plane today and wants to be called tomorrow

## 2020-06-14 NOTE — Telephone Encounter (Signed)
Will that be ok?

## 2020-06-14 NOTE — Telephone Encounter (Signed)
I do not mind him doing a lab visit but we also need to go ahead and have a planned follow-up visit within a few days-please schedule both at the same time.  May order CBC, CMP, direct LDL, A1c under his diabetes diagnosis

## 2020-06-15 NOTE — Telephone Encounter (Signed)
LVM to get pt scheduled

## 2020-06-18 ENCOUNTER — Telehealth: Payer: BC Managed Care – PPO | Admitting: Family Medicine

## 2020-06-18 DIAGNOSIS — S46012A Strain of muscle(s) and tendon(s) of the rotator cuff of left shoulder, initial encounter: Secondary | ICD-10-CM | POA: Diagnosis not present

## 2020-06-20 ENCOUNTER — Other Ambulatory Visit: Payer: BC Managed Care – PPO

## 2020-06-21 ENCOUNTER — Other Ambulatory Visit (INDEPENDENT_AMBULATORY_CARE_PROVIDER_SITE_OTHER): Payer: BC Managed Care – PPO

## 2020-06-21 DIAGNOSIS — E119 Type 2 diabetes mellitus without complications: Secondary | ICD-10-CM

## 2020-06-21 LAB — COMPREHENSIVE METABOLIC PANEL
ALT: 37 U/L (ref 0–53)
AST: 25 U/L (ref 0–37)
Albumin: 4.2 g/dL (ref 3.5–5.2)
Alkaline Phosphatase: 66 U/L (ref 39–117)
BUN: 16 mg/dL (ref 6–23)
CO2: 34 mEq/L — ABNORMAL HIGH (ref 19–32)
Calcium: 9.5 mg/dL (ref 8.4–10.5)
Chloride: 99 mEq/L (ref 96–112)
Creatinine, Ser: 0.92 mg/dL (ref 0.40–1.50)
GFR: 91.96 mL/min (ref >60.00)
Glucose, Bld: 118 mg/dL — ABNORMAL HIGH (ref 70–99)
Potassium: 4.1 mEq/L (ref 3.5–5.1)
Sodium: 140 mEq/L (ref 135–145)
Total Bilirubin: 0.5 mg/dL (ref 0.2–1.2)
Total Protein: 6.5 g/dL (ref 6.0–8.3)

## 2020-06-21 LAB — HEMOGLOBIN A1C: Hgb A1c MFr Bld: 6.5 % (ref 4.6–6.5)

## 2020-06-21 LAB — CBC WITH DIFFERENTIAL/PLATELET
Basophils Absolute: 0.1 10*3/uL (ref 0.0–0.1)
Basophils Relative: 0.7 % (ref 0.0–3.0)
Eosinophils Absolute: 0.1 10*3/uL (ref 0.0–0.7)
Eosinophils Relative: 0.8 % (ref 0.0–5.0)
HCT: 44.6 % (ref 39.0–52.0)
Hemoglobin: 14.9 g/dL (ref 13.0–17.0)
Lymphocytes Relative: 26.1 % (ref 12.0–46.0)
Lymphs Abs: 2 10*3/uL (ref 0.7–4.0)
MCHC: 33.4 g/dL (ref 30.0–36.0)
MCV: 81.5 fl (ref 78.0–100.0)
Monocytes Absolute: 0.8 10*3/uL (ref 0.1–1.0)
Monocytes Relative: 10.4 % (ref 3.0–12.0)
Neutro Abs: 4.8 10*3/uL (ref 1.4–7.7)
Neutrophils Relative %: 62 % (ref 43.0–77.0)
Platelets: 212 10*3/uL (ref 150.0–400.0)
RBC: 5.47 Mil/uL (ref 4.22–5.81)
RDW: 15.2 % (ref 11.5–15.5)
WBC: 7.8 10*3/uL (ref 4.0–10.5)

## 2020-06-21 LAB — LDL CHOLESTEROL, DIRECT: Direct LDL: 142 mg/dL

## 2020-06-22 ENCOUNTER — Other Ambulatory Visit: Payer: Self-pay

## 2020-06-22 MED ORDER — ATORVASTATIN CALCIUM 10 MG PO TABS: 10.0000 mg | ORAL_TABLET | ORAL | 3 refills | Status: DC

## 2020-07-20 ENCOUNTER — Other Ambulatory Visit: Payer: Self-pay | Admitting: Family Medicine

## 2020-07-23 DIAGNOSIS — M25512 Pain in left shoulder: Secondary | ICD-10-CM | POA: Diagnosis not present

## 2020-09-11 DIAGNOSIS — Z79890 Hormone replacement therapy (postmenopausal): Secondary | ICD-10-CM | POA: Diagnosis not present

## 2020-09-11 DIAGNOSIS — E291 Testicular hypofunction: Secondary | ICD-10-CM | POA: Diagnosis not present

## 2020-09-13 DIAGNOSIS — F330 Major depressive disorder, recurrent, mild: Secondary | ICD-10-CM | POA: Diagnosis not present

## 2020-09-13 DIAGNOSIS — N529 Male erectile dysfunction, unspecified: Secondary | ICD-10-CM | POA: Diagnosis not present

## 2020-09-13 DIAGNOSIS — I10 Essential (primary) hypertension: Secondary | ICD-10-CM | POA: Diagnosis not present

## 2020-09-13 DIAGNOSIS — E291 Testicular hypofunction: Secondary | ICD-10-CM | POA: Diagnosis not present

## 2020-10-15 DIAGNOSIS — I10 Essential (primary) hypertension: Secondary | ICD-10-CM | POA: Diagnosis not present

## 2020-10-15 DIAGNOSIS — H5213 Myopia, bilateral: Secondary | ICD-10-CM | POA: Diagnosis not present

## 2020-10-15 DIAGNOSIS — H35033 Hypertensive retinopathy, bilateral: Secondary | ICD-10-CM | POA: Diagnosis not present

## 2020-10-15 LAB — HM DIABETES EYE EXAM

## 2020-10-20 ENCOUNTER — Other Ambulatory Visit: Payer: Self-pay | Admitting: Family Medicine

## 2020-10-25 ENCOUNTER — Encounter: Payer: Self-pay | Admitting: Family Medicine

## 2020-11-07 ENCOUNTER — Other Ambulatory Visit: Payer: Self-pay | Admitting: Orthopedic Surgery

## 2020-11-07 DIAGNOSIS — S46012D Strain of muscle(s) and tendon(s) of the rotator cuff of left shoulder, subsequent encounter: Secondary | ICD-10-CM | POA: Diagnosis not present

## 2020-11-07 DIAGNOSIS — M25512 Pain in left shoulder: Secondary | ICD-10-CM

## 2020-11-17 ENCOUNTER — Ambulatory Visit
Admission: RE | Admit: 2020-11-17 | Discharge: 2020-11-17 | Disposition: A | Discharge status: Home / Self Care | Payer: BC Managed Care – PPO | Source: Ambulatory Visit | Attending: Orthopedic Surgery | Admitting: Orthopedic Surgery

## 2020-11-17 DIAGNOSIS — M19012 Primary osteoarthritis, left shoulder: Secondary | ICD-10-CM | POA: Diagnosis not present

## 2020-11-17 DIAGNOSIS — M65812 Other synovitis and tenosynovitis, left shoulder: Secondary | ICD-10-CM | POA: Diagnosis not present

## 2020-11-17 DIAGNOSIS — M7522 Bicipital tendinitis, left shoulder: Secondary | ICD-10-CM | POA: Diagnosis not present

## 2020-11-17 DIAGNOSIS — M25512 Pain in left shoulder: Secondary | ICD-10-CM

## 2020-11-17 DIAGNOSIS — M75122 Complete rotator cuff tear or rupture of left shoulder, not specified as traumatic: Secondary | ICD-10-CM | POA: Diagnosis not present

## 2020-11-17 IMAGING — MR MR SHOULDER*L* W/O CM
5 series · 35 of 40 positions shown · non-contrast
Comparison: None.

CLINICAL DATA: Left shoulder pain and decreased range of motion for
1 year

EXAM:
MRI OF THE LEFT SHOULDER WITHOUT CONTRAST
TECHNIQUE: Multiplanar, multisequence MR imaging of the shoulder was performed.
No intravenous contrast was administered.

[Series 3: T2 fat-sat · axial · 4.0mm · 0.55mm/px · z∈[-58,+58]mm · 8 of 26 slices shown (1 of 3)]
[im 1/26]
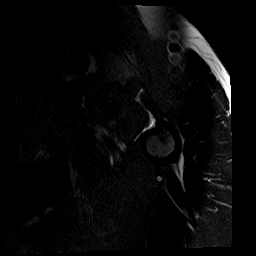
[im 3/26]
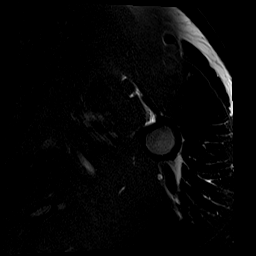
[im 9/26]
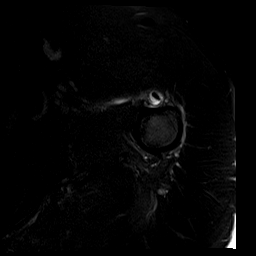
[im 12/26]
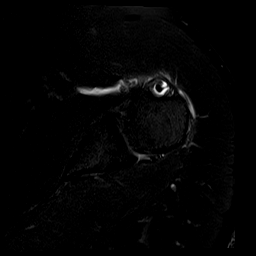
[im 14/26]
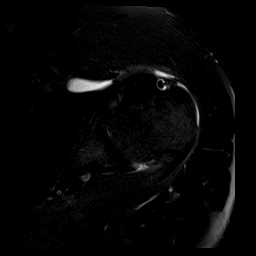
[im 17/26]
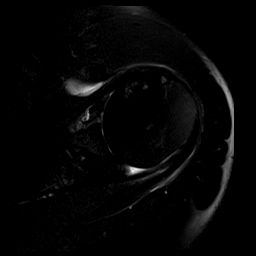
[im 23/26]
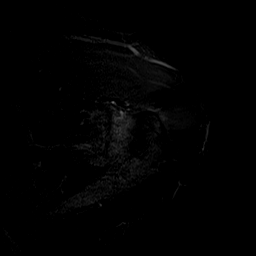
[im 26/26]
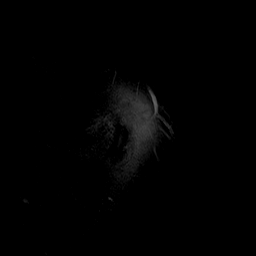

[Series 4: T2 fat-sat · oblique · 4.0mm · 0.55mm/px · 7 of 21 slices shown (2 of 3)]
[im 1/21]
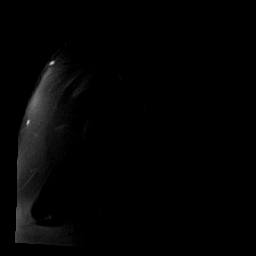
[im 4/21]
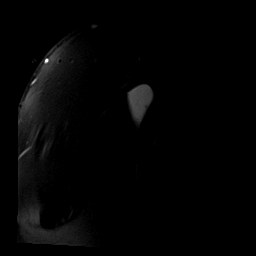
[im 7/21]
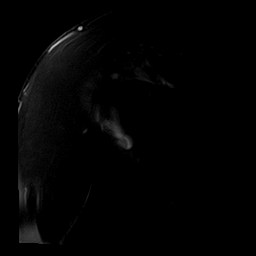
[im 11/21]
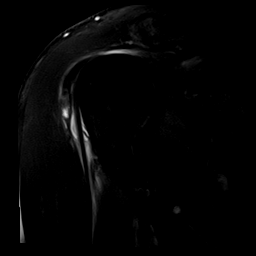
[im 14/21]
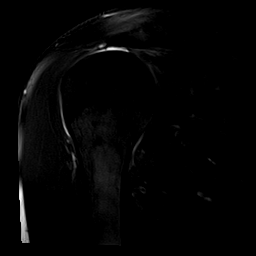
[im 17/21]
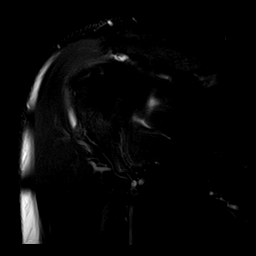
[im 21/21]
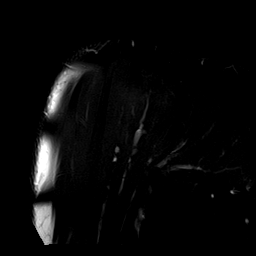

[Series 5: PD · oblique · 4.0mm · 0.27mm/px · 7 of 21 slices shown]
[im 1/21]
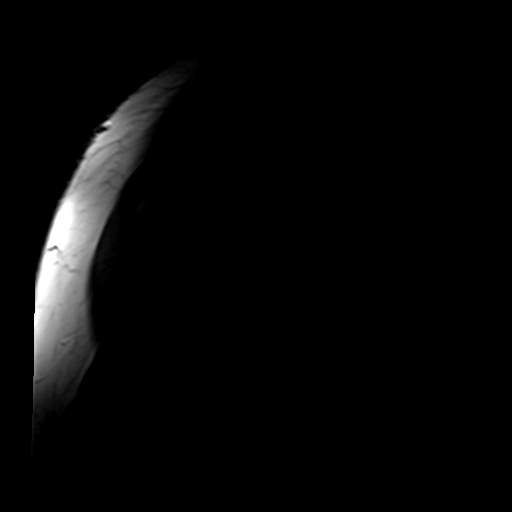
[im 4/21]
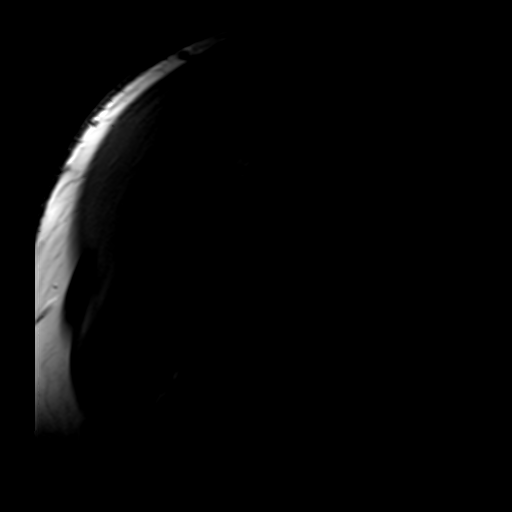
[im 7/21]
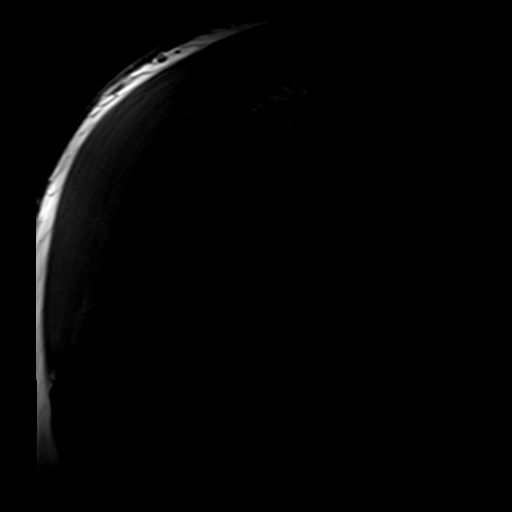
[im 11/21]
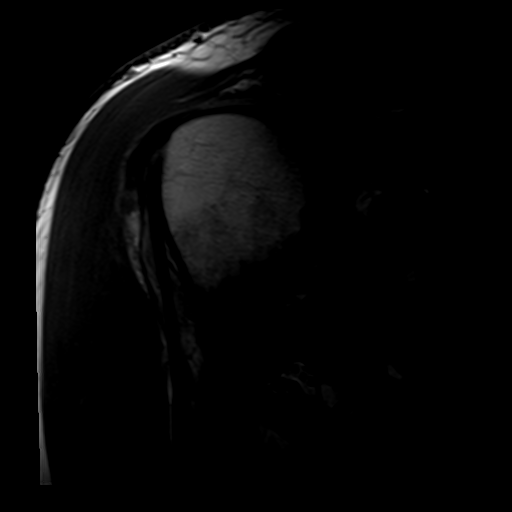
[im 14/21]
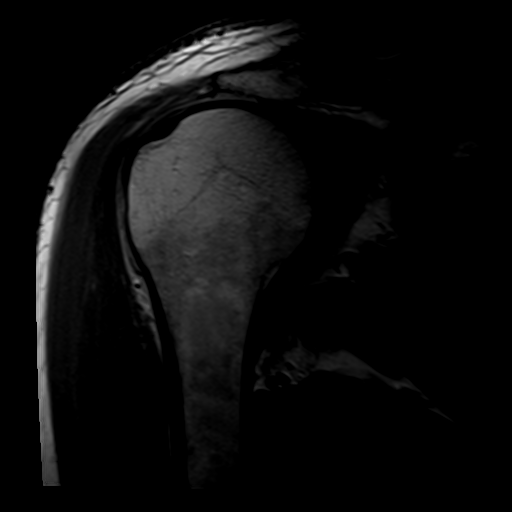
[im 17/21]
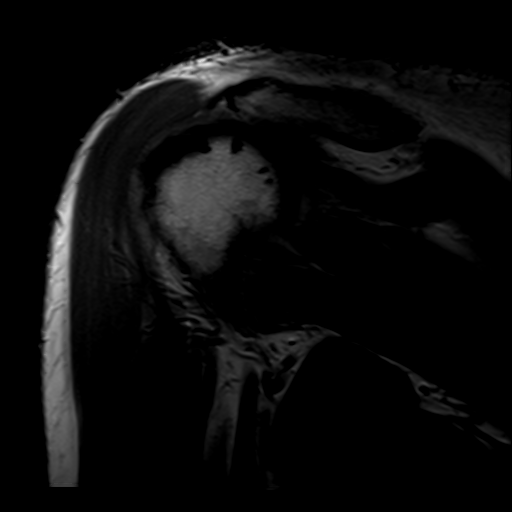
[im 21/21]
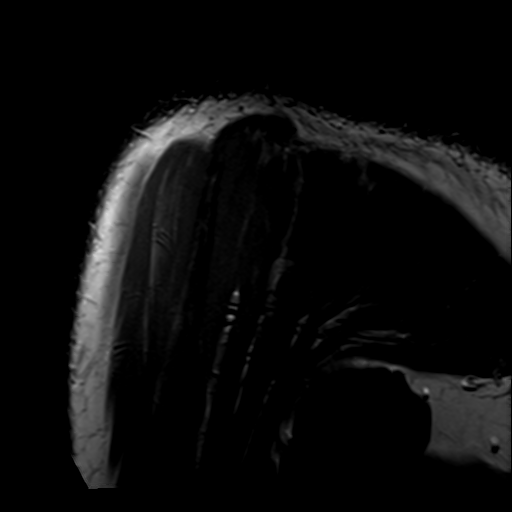

[Series 6: T2 fat-sat · oblique · 4.0mm · 0.55mm/px · 8 of 25 slices shown (3 of 3)]
[im 1/25]
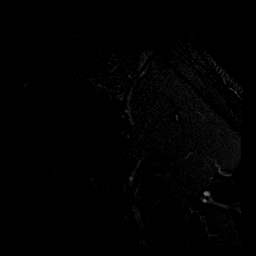
[im 4/25]
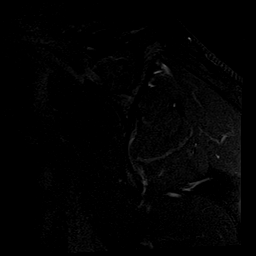
[im 7/25]
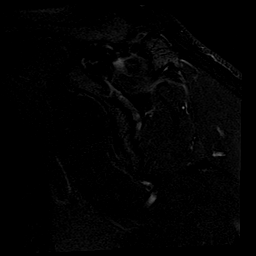
[im 11/25]
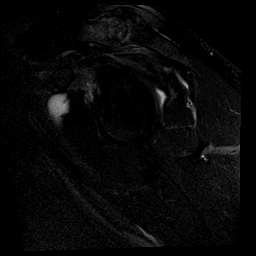
[im 14/25]
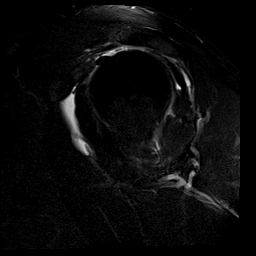
[im 18/25]
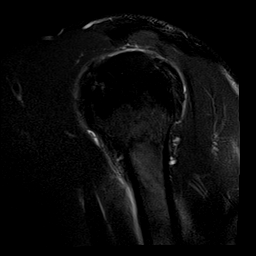
[im 21/25]
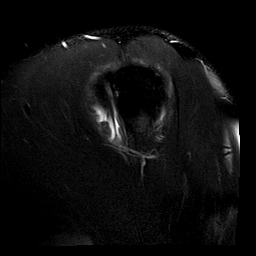
[im 25/25]
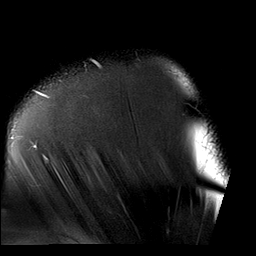

[Series 7: T1 · oblique · 4.0mm · 0.27mm/px · 5 of 25 slices shown]
[im 1/25]
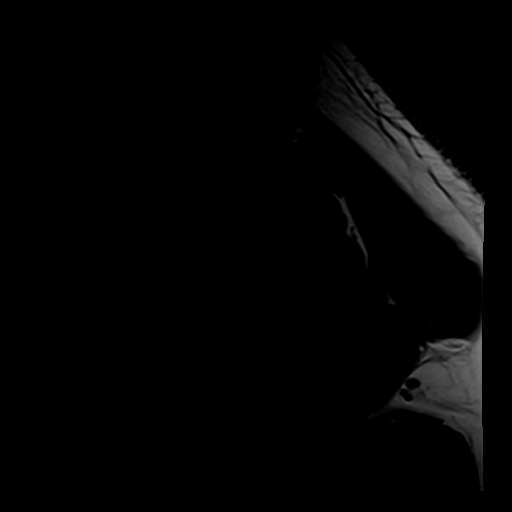
[im 4/25]
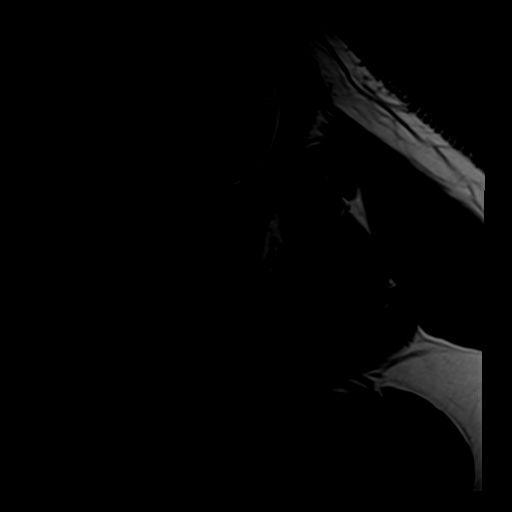
[im 7/25]
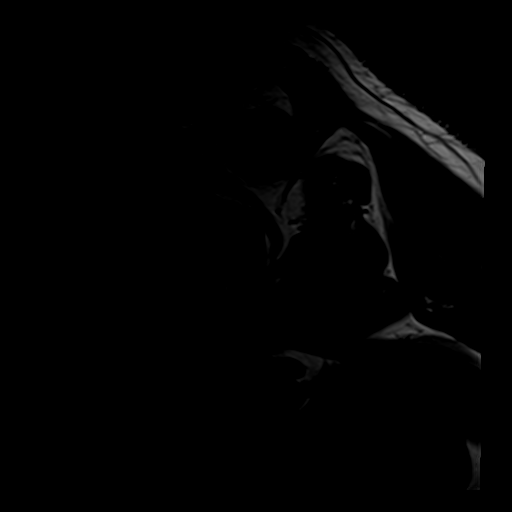
[im 11/25]
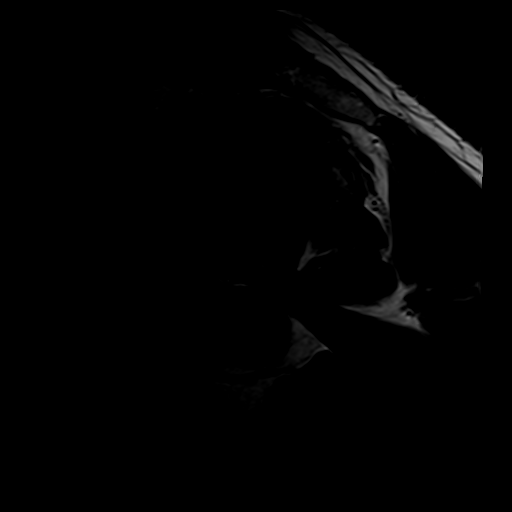
[im 14/25]
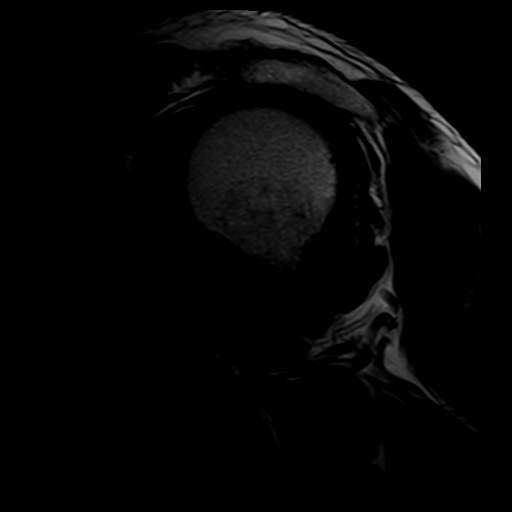

[35 of 40 positions shown; findings below may reference images not displayed]

FINDINGS: Rotator cuff: Complete full-thickness retracted tear of the
supraspinatus tendon retracted to the level of the glenohumeral
joint. Full-thickness retracted but incomplete tear of the
infraspinatus tendon. A portion of the posterior tendon insertion
remains intact. Subscapularis tendinosis without discrete tear.
Intact teres minor.

Muscles:  Mild supraspinatus muscle atrophy.

Biceps long head: Long head biceps tendon is intact with moderate
tenosynovitis.

Acromioclavicular Joint: Moderate arthropathy of the AC joint. Small
volume subacromial-subdeltoid bursal fluid communicating with the
glenohumeral joint.

Glenohumeral Joint: Mild diffuse chondral thinning. No significant
joint effusion.

Labrum: Suboptimally evaluated in the absence of intra-articular
contrast.

Bones: No acute fracture. No dislocation. No suspicious bone lesion.

Other: None.
IMPRESSION: 1. Complete full-thickness retracted tear of the supraspinatus
tendon. Full-thickness incomplete infraspinatus tendon tear. Mild
supraspinatus muscle atrophy.
2. Subscapularis tendinosis without discrete tear.
3. Moderate biceps tenosynovitis.
4. Moderate AC and mild glenohumeral osteoarthritis.

## 2020-11-26 DIAGNOSIS — S46012D Strain of muscle(s) and tendon(s) of the rotator cuff of left shoulder, subsequent encounter: Secondary | ICD-10-CM | POA: Diagnosis not present

## 2020-12-03 ENCOUNTER — Ambulatory Visit: Payer: BC Managed Care – PPO | Admitting: Family Medicine

## 2020-12-03 ENCOUNTER — Other Ambulatory Visit: Payer: Self-pay

## 2020-12-03 ENCOUNTER — Encounter: Payer: Self-pay | Admitting: Family Medicine

## 2020-12-03 VITALS — BP 128/88 | HR 79 | Temp 97.2°F | Wt 247.2 lb

## 2020-12-03 DIAGNOSIS — E119 Type 2 diabetes mellitus without complications: Secondary | ICD-10-CM

## 2020-12-03 DIAGNOSIS — Z79899 Other long term (current) drug therapy: Secondary | ICD-10-CM | POA: Diagnosis not present

## 2020-12-03 DIAGNOSIS — G720 Drug-induced myopathy: Secondary | ICD-10-CM

## 2020-12-03 DIAGNOSIS — Z125 Encounter for screening for malignant neoplasm of prostate: Secondary | ICD-10-CM | POA: Diagnosis not present

## 2020-12-03 DIAGNOSIS — M2550 Pain in unspecified joint: Secondary | ICD-10-CM

## 2020-12-03 DIAGNOSIS — E1169 Type 2 diabetes mellitus with other specified complication: Secondary | ICD-10-CM

## 2020-12-03 DIAGNOSIS — E1159 Type 2 diabetes mellitus with other circulatory complications: Secondary | ICD-10-CM

## 2020-12-03 DIAGNOSIS — F3342 Major depressive disorder, recurrent, in full remission: Secondary | ICD-10-CM

## 2020-12-03 DIAGNOSIS — Z23 Encounter for immunization: Secondary | ICD-10-CM | POA: Diagnosis not present

## 2020-12-03 DIAGNOSIS — I152 Hypertension secondary to endocrine disorders: Secondary | ICD-10-CM

## 2020-12-03 DIAGNOSIS — E785 Hyperlipidemia, unspecified: Secondary | ICD-10-CM

## 2020-12-03 LAB — CBC WITH DIFFERENTIAL/PLATELET
Basophils Absolute: 0.1 10*3/uL (ref 0.0–0.1)
Basophils Relative: 0.8 % (ref 0.0–3.0)
Eosinophils Absolute: 0.1 10*3/uL (ref 0.0–0.7)
Eosinophils Relative: 1.8 % (ref 0.0–5.0)
HCT: 50.7 % (ref 39.0–52.0)
Hemoglobin: 17 g/dL (ref 13.0–17.0)
Lymphocytes Relative: 27.7 % (ref 12.0–46.0)
Lymphs Abs: 2.1 10*3/uL (ref 0.7–4.0)
MCHC: 33.5 g/dL (ref 30.0–36.0)
MCV: 81.4 fl (ref 78.0–100.0)
Monocytes Absolute: 0.7 10*3/uL (ref 0.1–1.0)
Monocytes Relative: 9.5 % (ref 3.0–12.0)
Neutro Abs: 4.6 10*3/uL (ref 1.4–7.7)
Neutrophils Relative %: 60.2 % (ref 43.0–77.0)
Platelets: 224 10*3/uL (ref 150.0–400.0)
RBC: 6.23 Mil/uL — ABNORMAL HIGH (ref 4.22–5.81)
RDW: 14 % (ref 11.5–15.5)
WBC: 7.6 10*3/uL (ref 4.0–10.5)

## 2020-12-03 LAB — HEMOGLOBIN A1C: Hgb A1c MFr Bld: 7.3 % — ABNORMAL HIGH (ref 4.6–6.5)

## 2020-12-03 NOTE — Progress Notes (Signed)
Phone [phone] In person visit   Subjective:   Jason Lopez is a 60 y.o. year old very pleasant male patient who presents for/with See problem oriented charting Chief Complaint  Patient presents with   Diabetes   Hyperlipidemia    Stopped Atorvastatin end of August, causing horrible muscle pain in legs and lower back    Health Maintenance    Flu shot given in office today    This visit occurred during the SARS-CoV-2 public health emergency.  Safety protocols were in place, including screening questions prior to the visit, additional usage of staff PPE, and extensive cleaning of exam room while observing appropriate contact time as indicated for disinfecting solutions.   Past Medical History-  Patient Active Problem List   Diagnosis Date Noted   Diabetes mellitus (HCC) 12/27/2014    Priority: High   Drug-induced myopathy 12/05/2019    Priority: Medium   Obesity 04/17/2014    Priority: Medium   Major depression, recurrent, full remission (HCC) 04/22/2010    Priority: Medium   Hypogonadism in male 01/08/2010    Priority: Medium   ERECTILE DYSFUNCTION, MILD 08/02/2007    Priority: Medium   Hyperlipidemia associated with type 2 diabetes mellitus (HCC) 12/09/2006    Priority: Medium   Hypertension associated with diabetes (HCC) 12/09/2006    Priority: Medium   Seborrheic dermatitis 04/17/2014    Priority: Low   PLANTAR FASCIITIS 01/21/2010    Priority: Low   RADIAL NERVE INJURY 01/21/2010    Priority: Low   Allergic rhinitis 12/09/2006    Priority: Low   GERD 12/09/2006    Priority: Low   History of colonic polyps 12/09/2006    Priority: Low   RUQ abdominal pain 05/14/2015   Tendinopathy of right rotator cuff 12/27/2014    Medications- reviewed and updated Current Outpatient Medications  Medication Sig Dispense Refill   anastrozole (ARIMIDEX) 1 MG tablet Take 1 tablet by mouth. Every other week  0   DULoxetine (CYMBALTA) 60 MG capsule TAKE ONE CAPSULE  EACH DAY 90 capsule 1   esomeprazole (NEXIUM) 20 MG capsule Take 40 mg by mouth daily at 12 noon. OTC     fluticasone (FLONASE) 50 MCG/ACT nasal spray PLACE 2 SPRAYS INTO THE NOSE DAILY. 16 g 2   lisinopril-hydrochlorothiazide (ZESTORETIC) 20-25 MG tablet TAKE ONE TABLET DAILY 90 tablet 3   metFORMIN (GLUCOPHAGE) 1000 MG tablet Take 1 tablet (1,000 mg total) by mouth 2 (two) times daily with a meal. 180 tablet 3   Multiple Vitamin (MULTIVITAMIN) tablet Take 1 tablet by mouth daily.     spironolactone (ALDACTONE) 25 MG tablet TAKE ONE TABLET DAILY 90 tablet 2   atorvastatin (LIPITOR) 10 MG tablet Take 1 tablet (10 mg total) by mouth every other day. (Patient not taking: Reported on 12/03/2020) 45 tablet 3   No current facility-administered medications for this visit.     Objective:  BP 128/88 (BP Location: Left Arm, Patient Position: Sitting) Comment: retake in the office  Pulse 79   Temp (!) 97.2 F (36.2 C) (Temporal)   Wt 247 lb 3.2 oz (112.1 kg)   SpO2 95%   BMI 30.90 kg/m  Gen: NAD, resting comfortably TM normal, oropharynx normal  CV: RRR no murmurs rubs or gallops Lungs: CTAB no crackles, wheeze, rhonchi Abdomen: soft/nontender/nondistended/normal bowel sounds. No rebound or guarding.  Ext: no edema Skin: warm, dry Neuro: grossly normal, moves all extremities  Diabetic Foot Exam - Simple   Simple Foot Form Diabetic  Foot exam was performed with the following findings: Yes 12/03/2020  1:24 PM  Visual Inspection No deformities, no ulcerations, no other skin breakdown bilaterally: Yes Sensation Testing Intact to touch and monofilament testing bilaterally: Yes Pulse Check Posterior Tibialis and Dorsalis pulse intact bilaterally: Yes Comments        Assessment and Plan    Hyperlipidemia    Stopped Atorvastatin end of August, causing horrible muscle pain in legs and lower back    Health Maintenance    Flu shot given in office today   # History of COVID-19- S:Patient  had a video visit with [name] on 04/23/20 for covid. He tested positive on a at home test on 04/22/2020 -was exposed from co-worker on 04/18/2020- had symptoms of coughing (dry coughing), congestion, headache, sinus pressure and fatigue. Denied loss of taste or smell, fever, chest pains or shortness of breath. Tried using Ibuprofen, Tylenol, and Claritin-D. He had Pfizer and plus booster.  -Was prescribed paxlovid -Today patient reports thankfully no lingering symptoms other than some fatigue- this got better off stati    # Diabetes S: Medication:metformin 1000 mg twice daily   CBGs- not checking Exercise and diet- trainer 3 days a week- down 9 lbs from last year! Tyring to eat healthy diet Lab Results  Component Value Date   HGBA1C 6.5 06/21/2020   HGBA1C 8.9 (H) 12/05/2019   HGBA1C 6.7 (H) 05/31/2018   A/P: At goal last visit.  Hopefully remains well controlled.  Update A1c with labs today. Continue current meds- plans to add walking  #Left Hip stitch removal after testosterone pellet placement positive [facility]-last visit we removed a stitch over top testosterone pellet placement-had developed abscess underneath this.  We did opt to place him on antibiotic.  Thankfully no lingering issues - Remains on Arimidex and  with no significant recurrent issues with pellets- they check levels of tesosterone- I will check psa   #hypertension S: medication: lisinopril-hctz 20-25mg  daily, spironolactone 25 mg daily - emotional with [facility] this Am- initial BP slightly high  Home readings #s: often high 120s/ 70s or low 80s BP Readings from Last 3 Encounters:  12/03/20 128/88  12/05/19 122/64  05/31/18 138/80  A/P:  Controlled on repeat though high normal diastolic.  Continue current medications.    # Depression S: Medication:cymbalta 60 daily A/P: Remains in full remission-continue current medication   #hyperlipidemia/myalgia on statin S: Medication::Vytorin (discontinued)  in  past a few times a week. Crestor (discontinued) with joint pain.  Lipids 09/21: poorly controlled on atorvastatin once a week-attempted to increase to every other day-patient reports severe myalgias and had to stop- mainly in his back- also caused knee and shoulder issues- had several visits with Dr. Dion Saucier - did have rotator cuff tear- looking at patch procedure - refer to murphy wainer Lab Results  Component Value Date   CHOL 211 (H) 12/05/2019   HDL 46 12/05/2019   LDLCALC 126 (H) 12/05/2019   LDLDIRECT 142.0 06/21/2020   TRIG 244 (H) 12/05/2019   CHOLHDL 4.6 12/05/2019   A/P: we will update lipids today off statin- cannot tolerate unfortuantely so we may need to simply focus on lifestyle- congratulated on weight loss!   # umbilical hernia noted on exam  reducible- declined surgery consult for now   Recommended follow up: Return in about 4 months (around 04/04/2021) for physical or sooner if needed..  Lab/Order associations:   ICD-10-CM   1. Type 2 diabetes mellitus without complication, without long-term current  use of insulin (HCC)  E11.9 CBC with Differential/Platelet    Comprehensive metabolic panel    Lipid panel    Hemoglobin A1c    2. Hyperlipidemia associated with type 2 diabetes mellitus (HCC)  E11.69    E78.5     3. Major depression, recurrent, full remission (HCC)  F33.42     4. Hypertension associated with diabetes (HCC)  E11.59    I15.2     5. Arthralgia, unspecified joint  M25.50     6. Drug-induced myopathy  G72.0     7. Screening for prostate cancer  Z12.5 PSA    8. High risk medication use  Z79.899 B12     I,Jada Bradford,acting as a scribe for Tana Conch, MD.,have documented all relevant documentation on the behalf of Tana Conch, MD,as directed by  Tana Conch, MD while in the presence of Tana Conch, MD.  I, Tana Conch, MD, have reviewed all documentation for this visit. The documentation on 12/03/20 for the exam, diagnosis, procedures,  and orders are all accurate and complete.  Return precautions advised.  Tana Conch, MD

## 2020-12-03 NOTE — Patient Instructions (Addendum)
Health Maintenance Due  Topic Date Due   INFLUENZA VACCINE  - regular dose flu shot today   10/15/2020   Team please update PHQ-9.   Please stop by lab before you go If you have mychart- we will send your results within 3 business days of [place] receiving them.  If you do not have mychart- we will call you about results within 5 business days of [place] receiving them.  *please also note that you will see labs on mychart as soon as they post. I will later go in and write notes on them- will say "notes from Dr. Durene Cal"   Recommended follow up: Return in about 4 months (around 04/04/2021) for physical or sooner if needed.Marland Kitchen

## 2020-12-04 DIAGNOSIS — S46012D Strain of muscle(s) and tendon(s) of the rotator cuff of left shoulder, subsequent encounter: Secondary | ICD-10-CM | POA: Diagnosis not present

## 2020-12-04 LAB — COMPREHENSIVE METABOLIC PANEL
ALT: 33 U/L (ref 0–53)
AST: 24 U/L (ref 0–37)
Albumin: 4.6 g/dL (ref 3.5–5.2)
Alkaline Phosphatase: 75 U/L (ref 39–117)
BUN: 15 mg/dL (ref 6–23)
CO2: 32 mEq/L (ref 19–32)
Calcium: 10.2 mg/dL (ref 8.4–10.5)
Chloride: 95 mEq/L — ABNORMAL LOW (ref 96–112)
Creatinine, Ser: 1.05 mg/dL (ref 0.40–1.50)
GFR: 78.22 mL/min (ref >60.00)
Glucose, Bld: 118 mg/dL — ABNORMAL HIGH (ref 70–99)
Potassium: 4.4 mEq/L (ref 3.5–5.1)
Sodium: 139 mEq/L (ref 135–145)
Total Bilirubin: 0.5 mg/dL (ref 0.2–1.2)
Total Protein: 7.7 g/dL (ref 6.0–8.3)

## 2020-12-04 LAB — LIPID PANEL
Cholesterol: 253 mg/dL — ABNORMAL HIGH (ref 0–200)
HDL: 58.8 mg/dL (ref >39.00)
NonHDL: 193.77
Total CHOL/HDL Ratio: 4
Triglycerides: 368 mg/dL — ABNORMAL HIGH (ref 0.0–149.0)
VLDL: 73.6 mg/dL — ABNORMAL HIGH (ref 0.0–40.0)

## 2020-12-04 LAB — VITAMIN B12: Vitamin B-12: 656 pg/mL (ref 211–911)

## 2020-12-04 LAB — LDL CHOLESTEROL, DIRECT: Direct LDL: 161 mg/dL

## 2020-12-04 LAB — PSA: PSA: 0.53 ng/mL (ref 0.10–4.00)

## 2020-12-19 ENCOUNTER — Other Ambulatory Visit: Payer: Self-pay

## 2020-12-19 ENCOUNTER — Telehealth (INDEPENDENT_AMBULATORY_CARE_PROVIDER_SITE_OTHER): Payer: BC Managed Care – PPO | Admitting: Physician Assistant

## 2020-12-19 DIAGNOSIS — U071 COVID-19: Secondary | ICD-10-CM | POA: Diagnosis not present

## 2020-12-19 MED ORDER — EMPAGLIFLOZIN 10 MG PO TABS: 10.0000 mg | ORAL_TABLET | Freq: Every day | ORAL | 5 refills | Status: AC

## 2020-12-19 MED ORDER — MOLNUPIRAVIR EUA 200MG CAPSULE: 4.0000 | ORAL_CAPSULE | Freq: Two times a day (BID) | ORAL | 0 refills | Status: AC

## 2020-12-19 NOTE — Patient Instructions (Signed)
Take molnupiravir as directed. Mask around other at least 5 additional days. ED if acutely worse.

## 2020-12-19 NOTE — Progress Notes (Signed)
Virtual Visit via Video Note  I connected with  Jason Lopez  on 12/19/20 at 11:45 AM EDT by a video enabled telemedicine application and verified that I am speaking with the correct person using two identifiers.  Location: Patient: home Provider: Nature conservation officer at [employer] Persons present: Patient and myself   I discussed the limitations of evaluation and management by telemedicine and the availability of in person appointments. The patient expressed understanding and agreed to proceed.   History of Present Illness: Chief complaint: COVID+  Symptom onset: 12/16/20 Pertinent positives: ST, cough, congestion, slight SOB with going upstairs, fatigue, hoarseness, Zinc Pertinent negatives: No CP, fever or chills  Treatments tried: Mucinex, Advil, Vit C Vaccine status: Pfizer x 2 plus booster Sick exposure: Two positive co-workers   2nd round with COVID-19 (first episode in February 2022). 58 yo mother lives with him and he is concerned about her catching this.    Observations/Objective:  Temp 98 F  Gen: Awake, alert, no acute distress, congested and hoarse Resp: Breathing is even and non-labored Psych: calm/pleasant demeanor Neuro: Alert and Oriented x 3, + facial symmetry, speech is clear.   Assessment and Plan:  1. COVID-19 Diagnosis confirmed via home antigen test.  Patient is currently having mild symptoms.  We discussed current algorithm recommendations for prescribing outpatient antivirals.  As the patient is diabetic and obese, within 5 day prescribing window, antiviral treatment are indicated at this time.  Risks versus benefits discussed.  Molnupiravir agreed upon, possible side effects discussed. Advised self-isolation at home for the next 5 days and then masking around others for at least an additional 5 days.  Treat supportively at this time as well including sleeping prone, deep breathing exercises, pushing fluids, walking every few hours, vitamins C  and D, and Tylenol or ibuprofen as needed.  The patient understands that COVID-19 illness can wax and wane.  Should the symptoms acutely worsen or patient starts to experience sudden shortness of breath, chest pain, severe weakness, the patient will go straight to the emergency department.  Also advised home pulse oximetry monitoring and for any reading consistently under 92%, should also report to the emergency department.  The patient will continue to keep [place] updated.    Follow Up Instructions:    I discussed the assessment and treatment plan with the patient. The patient was provided an opportunity to ask questions and all were answered. The patient agreed with the plan and demonstrated an understanding of the instructions.   The patient was advised to call back or seek an in-person evaluation if the symptoms worsen or if the condition fails to improve as anticipated.  Jason Lopez M Jason Fata, PA-C

## 2021-01-07 DIAGNOSIS — E291 Testicular hypofunction: Secondary | ICD-10-CM | POA: Diagnosis not present

## 2021-01-07 DIAGNOSIS — Z79890 Hormone replacement therapy (postmenopausal): Secondary | ICD-10-CM | POA: Diagnosis not present

## 2021-01-14 DIAGNOSIS — E291 Testicular hypofunction: Secondary | ICD-10-CM | POA: Diagnosis not present

## 2021-01-14 DIAGNOSIS — D751 Secondary polycythemia: Secondary | ICD-10-CM | POA: Diagnosis not present

## 2021-01-14 DIAGNOSIS — L709 Acne, unspecified: Secondary | ICD-10-CM | POA: Diagnosis not present

## 2021-01-14 DIAGNOSIS — R6882 Decreased libido: Secondary | ICD-10-CM | POA: Diagnosis not present

## 2021-02-14 DIAGNOSIS — M79674 Pain in right toe(s): Secondary | ICD-10-CM | POA: Diagnosis not present

## 2021-02-14 DIAGNOSIS — S46012D Strain of muscle(s) and tendon(s) of the rotator cuff of left shoulder, subsequent encounter: Secondary | ICD-10-CM | POA: Diagnosis not present

## 2021-04-23 ENCOUNTER — Other Ambulatory Visit: Payer: Self-pay | Admitting: Family Medicine

## 2021-04-25 DIAGNOSIS — M25511 Pain in right shoulder: Secondary | ICD-10-CM | POA: Diagnosis not present

## 2021-06-10 NOTE — Progress Notes (Signed)
? ?Phone: [phone] ?  ?Subjective:  ?Patient presents today for their annual physical. Chief complaint-noted.  ? ?See problem oriented charting- ?ROS- full  review of systems was completed and negative  ?except for: congestion, sinus pressure, sneezing- thinks all pollen related, stye has improved ? ?The following were reviewed and entered/updated in epic: ?Past Medical History:  ?Diagnosis Date  ? COLONIC POLYPS, HX OF 12/09/2006  ? ERECTILE DYSFUNCTION, MILD 08/02/2007  ? GERD 12/09/2006  ? HYPERLIPIDEMIA 12/09/2006  ? HYPERTENSION 12/09/2006  ? HYPOGONADISM 01/08/2010  ? PLANTAR FASCIITIS 01/21/2010  ? RADIAL NERVE INJURY 01/21/2010  ? ?Patient Active Problem List  ? Diagnosis Date Noted  ? Diabetes mellitus (HCC) 12/27/2014  ?  Priority: High  ? Drug-induced myopathy 12/05/2019  ?  Priority: Medium   ? Obesity 04/17/2014  ?  Priority: Medium   ? Major depression, recurrent, full remission (HCC) 04/22/2010  ?  Priority: Medium   ? Hypogonadism in male 01/08/2010  ?  Priority: Medium   ? ERECTILE DYSFUNCTION, MILD 08/02/2007  ?  Priority: Medium   ? Hyperlipidemia associated with type 2 diabetes mellitus (HCC) 12/09/2006  ?  Priority: Medium   ? Hypertension associated with diabetes (HCC) 12/09/2006  ?  Priority: Medium   ? Seborrheic dermatitis 04/17/2014  ?  Priority: Low  ? PLANTAR FASCIITIS 01/21/2010  ?  Priority: Low  ? RADIAL NERVE INJURY 01/21/2010  ?  Priority: Low  ? Allergic rhinitis 12/09/2006  ?  Priority: Low  ? GERD 12/09/2006  ?  Priority: Low  ? History of colonic polyps 12/09/2006  ?  Priority: Low  ? RUQ abdominal pain 05/14/2015  ? Tendinopathy of right rotator cuff 12/27/2014  ? ?Past Surgical History:  ?Procedure Laterality Date  ? COLONOSCOPY  2008, 2011  ? ? ?Family History  ?Problem Relation Age of Onset  ? Breast cancer Mother   ? Pancreatic cancer Maternal Grandmother   ? Diabetes Father   ? Hypertension Father   ?     mother  ? Hyperlipidemia Father   ? Lung cancer Maternal Grandfather    ?     smoker  ? ? ?Medications- reviewed and updated ?Current Outpatient Medications  ?Medication Sig Dispense Refill  ? anastrozole (ARIMIDEX) 1 MG tablet Take 1 tablet by mouth. Every other week  0  ? DULoxetine (CYMBALTA) 60 MG capsule TAKE ONE CAPSULE EACH DAY 90 capsule 1  ? esomeprazole (NEXIUM) 20 MG capsule Take 40 mg by mouth daily at 12 noon. OTC    ? fluticasone (FLONASE) 50 MCG/ACT nasal spray PLACE 2 SPRAYS INTO THE NOSE DAILY. 16 g 2  ? JARDIANCE 10 MG TABS tablet TAKE ONE TABLET EACH DAY BEFORE BREAKFAST 30 tablet 5  ? lisinopril-hydrochlorothiazide (ZESTORETIC) 20-25 MG tablet TAKE ONE TABLET DAILY 90 tablet 3  ? Multiple Vitamin (MULTIVITAMIN) tablet Take 1 tablet by mouth daily.    ? spironolactone (ALDACTONE) 25 MG tablet TAKE ONE TABLET DAILY 90 tablet 2  ? ?No current facility-administered medications for this visit.  ? ? ?Allergies-reviewed and updated ?No Known Allergies ? ?Social History  ? ?Social History Narrative  ? Lives alone with mom living at him at times.   ?   ? Work as a Social worker  ?   ? Hobbies: working on house, garden, shop  ? ?Objective  ?Objective:  ?BP 130/74   Pulse 64   Temp 98.1 ?F (36.7 ?C)   Ht 6\' 3"  (1.905 m)   Wt 235 lb (106.6  kg)   SpO2 96%   BMI 29.37 kg/m?  ?Gen: NAD, resting comfortably ?HEENT: Mucous membranes are moist. Oropharynx normal ?Neck: no obvious thyromegaly ?CV: RRR no murmurs rubs or gallops ?Lungs: CTAB no crackles, wheeze, rhonchi ?Abdomen: soft/nontender/nondistended/normal bowel sounds. No rebound or guarding.  ?Ext: no edema ?Skin: warm, dry ?Neuro: grossly normal, moves all extremities, PERRLA ? ?  ?Assessment and Plan  ?61 y.o. male presenting for annual physical.  ?Health Maintenance counseling: ?1. Anticipatory guidance: Patient counseled regarding regular dental exams -q6 months, eye exams --yearly, avoiding smoking and second hand smoke , limiting alcohol to 2 beverages per day .  No illicit drugs ?2. Risk factor reduction:  Advised  patient of need for regular exercise and diet rich and fruits and vegetables to reduce risk of heart attack and stroke.  ?Exercise-- working with trainer getting ready for shoulder surgery- has stopped. Walking is hard due to allergies ?Diet/weight management-down 12 lbs improving meal choices. Not working with [facility] on this- congratulate don weight loss ?Wt Readings from Last 3 Encounters:  ?07/08/21 235 lb (106.6 kg)  ?06/17/21 243 lb 12.8 oz (110.6 kg)  ?12/03/20 247 lb 3.2 oz (112.1 kg)  ? ?3. Immunizations/screenings/ancillary studies ?DISCUSSED:  ?-COVID booster vaccination #4 -opts out- had covid in october ?-Prevnar 20 today ?Immunization History  ?Administered Date(s) Administered  ? Influenza Split 01/15/2012  ? Influenza Whole 04/19/2007, 12/25/2008, 01/21/2010  ? Influenza,inj,Quad PF,6+ Mos 12/06/2012, 04/17/2014, 12/27/2014, 01/07/2016, 12/15/2016, 01/04/2018, 12/06/2018, 12/05/2019, 12/03/2020  ? Influenza-Unspecified 01/15/2012  ? PFIZER(Purple Top)SARS-COV-2 Vaccination 05/27/2019, 06/17/2019, 12/21/2019  ? Td 09/14/2005  ? Tdap 09/24/2016  ? Zoster Recombinat (Shingrix) 09/24/2016, 11/28/2016  ?4. Prostate cancer screening- low risk prior PSA trend.  Update PSA today.  Defer rectal unless PSA trend worsening   ?Lab Results  ?Component Value Date  ? PSA 0.53 12/03/2020  ? PSA 0.65 12/05/2019  ? PSA 0.59 01/04/2018  ? 5. Colon cancer screening - 05/28/15 with a 10 year repeat planned -due to history of polyps ?6. Skin cancer screening- no dermatologist- does have 2 likely epidermoid/sebaceous cysts- one in groin- one on scalp. advised regular sunscreen use. Denies worrisome, changing, or new skin lesions.  ?7. Smoking associated screening (lung cancer screening, AAA screen 65-75, UA)- NEVER smoker ?8. STD screening - opts out ? ?Status of chronic or acute concerns  ? ?#caregiver burden- ongoing stress with caring for his mom ?-takes cymbalta 60mg  ?- discussed possibly seeing a therapist- he will  think on this ? ?# Diabetes ?S: Medication: jardiance 10 mg ?-he stopped metformin due to GI issues ?Lab Results  ?Component Value Date  ? HGBA1C 7.3 (H) 12/03/2020  ? HGBA1C 6.5 06/21/2020  ? HGBA1C 8.9 (H) 12/05/2019  ? A/P: will update a1c- plan was to continue metformin but he had stopped this when we started jardiance- if a1c above 7 but close may be able to do jardiance 25 mg- otherwise will need metformin back ?  ?# Hypotesteronism - has pellets in from the fall and still on Arimidex- managed by [facility] still- will defer lbas ? ?#hypertension ?S: medication: lisinopril-hctz 20-25mg  daily, spironolactone 25 mg daily ?BP Readings from Last 3 Encounters:  ?07/08/21 130/74  ?06/17/21 (!) 148/60  ?12/03/20 128/88  ?A/P: Controlled. Continue current medications.  ? ?#GERD- nexium working well ? ?#allergies- flonase some relief as well as allegra ? ?# Depression ?S: Medication:cymbalta 60 daily ? ?  07/08/2021  ? 10:08 AM 12/05/2019  ? 10:18 AM 10/04/2018  ?  9:09  AM  ?Depression screen PHQ 2/9  ?Decreased Interest 0 0 1  ?Down, Depressed, Hopeless 0 0 1  ?PHQ - 2 Score 0 0 2  ?Altered sleeping 0 0 0  ?Tired, decreased energy 0 0 0  ?Change in appetite 0 0 0  ?Feeling bad or failure about yourself  0 0 1  ?Trouble concentrating 1 0 1  ?Moving slowly or fidgety/restless 0 0 0  ?Suicidal thoughts 0 0 0  ?PHQ-9 Score 1 0 4  ?Difficult doing work/chores Not difficult at all Not difficult at all Not difficult at all  ?A/P: reasonable control other than caregiver burden- see above- continue current meds ?  ?#hyperlipidemia/myalgia on statin ?S: Medication:: none ?-severe joint pain even on once a week crestor or atorvastatin. Also didn't tolerate vytorin ?Lab Results  ?Component Value Date  ? CHOL 253 (H) 12/03/2020  ? HDL 58.80 12/03/2020  ? LDLCALC 126 (H) 12/05/2019  ? LDLDIRECT 161.0 12/03/2020  ? TRIG 368.0 (H) 12/03/2020  ? CHOLHDL 4 12/03/2020  ? A/P: check lipids next visit- continue to work on lifestyle ?  ?#  umbilical hernia noted on exam  reducible- declined surgery consult around the time. Reports stable  ? ?#Left shoulder pain- just had CT scan on 07/05/21 with Dr. Everardo PacificVarkey ?-considering July surgery- would be out for

## 2021-06-13 ENCOUNTER — Other Ambulatory Visit: Payer: Self-pay | Admitting: Family Medicine

## 2021-06-17 ENCOUNTER — Encounter: Payer: Self-pay | Admitting: Physician Assistant

## 2021-06-17 ENCOUNTER — Ambulatory Visit (INDEPENDENT_AMBULATORY_CARE_PROVIDER_SITE_OTHER): Payer: BC Managed Care – PPO | Admitting: Physician Assistant

## 2021-06-17 VITALS — BP 148/60 | HR 82 | Temp 98.0°F | Ht 75.0 in | Wt 243.8 lb

## 2021-06-17 DIAGNOSIS — H00012 Hordeolum externum right lower eyelid: Secondary | ICD-10-CM

## 2021-06-17 MED ORDER — OFLOXACIN 0.3 % OP SOLN: 1.0000 [drp] | Freq: Four times a day (QID) | OPHTHALMIC | 0 refills | Status: DC

## 2021-06-17 NOTE — Patient Instructions (Signed)
It was great to see you! ? ?I think you have a stye and early infection (conjunctivitis) ? ?Start topical ofloxacin DROPS since you are using contacts ? ?Ideally, wear contacts the least amount of time possible ? ?If worsening, let us know ? ?Take care, ? ?Jarold Motto PA-C  ?

## 2021-06-17 NOTE — Progress Notes (Signed)
Jason Lopez is a 61 y.o. male here for right eye drainage. ? ?History of Present Illness:  ? ?Chief Complaint  ?Patient presents with  ? Eye Drainage  ?  Pt stated that he has been having Rt eye drainage. He wears contacts and have tried to washed the contacts out he is still having problems wit his eye. The eye has been swollen and red also. Has been using saline to help and rewet drops taking contacts out.  ? ? ?HPI ? ?Right Eye Drainage  ?Jason Lopez states on Friday night he was watching tv when he noticed his vision in his right eye became blurry and his lower lid became swollen. Initially he believed this to be caused by his contacts, but upon taking them out and washing them his sx didn't resolve. According to pt, his right eye remained swollen throughout the weekend despite taking a daily antihistamine and applying a warm compress. It wasn't until he woke this morning and felt sticky like discharge coming from his right eye that he decided to have this further evaluated. At this time he states he is unable to go without using his contacts at this time due to waiting for his new glasses to come in and needing to drive. He is wondering what more he could do to resolve this. Denies exposure to pink eye, abnormal cough, fever, or chills.  ? ?Past Medical History:  ?Diagnosis Date  ? COLONIC POLYPS, HX OF 12/09/2006  ? ERECTILE DYSFUNCTION, MILD 08/02/2007  ? GERD 12/09/2006  ? HYPERLIPIDEMIA 12/09/2006  ? HYPERTENSION 12/09/2006  ? HYPOGONADISM 01/08/2010  ? PLANTAR FASCIITIS 01/21/2010  ? RADIAL NERVE INJURY 01/21/2010  ? ?  ?Social History  ? ?Tobacco Use  ? Smoking status: Never  ? Smokeless tobacco: Never  ?Substance Use Topics  ? Alcohol use: Yes  ?  Alcohol/week: 0.0 - 5.0 standard drinks  ? Drug use: No  ? ? ?Past Surgical History:  ?Procedure Laterality Date  ? COLONOSCOPY  2008, 2011  ? ? ?Family History  ?Problem Relation Age of Onset  ? Breast cancer Mother   ? Pancreatic cancer Maternal Grandmother   ?  Diabetes Father   ? Hypertension Father   ?     mother  ? Hyperlipidemia Father   ? Lung cancer Maternal Grandfather   ?     smoker  ? ? ?No Known Allergies ? ?Current Medications:  ? ?Current Outpatient Medications:  ?  anastrozole (ARIMIDEX) 1 MG tablet, Take 1 tablet by mouth. Every other week, Disp: , Rfl: 0 ?  DULoxetine (CYMBALTA) 60 MG capsule, TAKE ONE CAPSULE EACH DAY, Disp: 90 capsule, Rfl: 1 ?  esomeprazole (NEXIUM) 20 MG capsule, Take 40 mg by mouth daily at 12 noon. OTC, Disp: , Rfl:  ?  fluticasone (FLONASE) 50 MCG/ACT nasal spray, PLACE 2 SPRAYS INTO THE NOSE DAILY., Disp: 16 g, Rfl: 2 ?  JARDIANCE 10 MG TABS tablet, TAKE ONE TABLET EACH DAY BEFORE BREAKFAST, Disp: 30 tablet, Rfl: 5 ?  lisinopril-hydrochlorothiazide (ZESTORETIC) 20-25 MG tablet, TAKE ONE TABLET DAILY, Disp: 90 tablet, Rfl: 3 ?  metFORMIN (GLUCOPHAGE) 1000 MG tablet, Take 1 tablet (1,000 mg total) by mouth 2 (two) times daily with a meal., Disp: 180 tablet, Rfl: 3 ?  Multiple Vitamin (MULTIVITAMIN) tablet, Take 1 tablet by mouth daily., Disp: , Rfl:  ?  spironolactone (ALDACTONE) 25 MG tablet, TAKE ONE TABLET DAILY, Disp: 90 tablet, Rfl: 2  ? ?Review of Systems:  ? ?ROS ?Negative  unless otherwise specified per HPI. ?Vitals:  ? ?Vitals:  ? 06/17/21 1146  ?BP: (!) 148/60  ?Pulse: 82  ?Temp: 98 ?F (36.7 ?C)  ?SpO2: 94%  ?Weight: 243 lb 12.8 oz (110.6 kg)  ?Height: 6\' 3"  (1.905 m)  ?   ?Body mass index is 30.47 kg/m?. ? ?Physical Exam:  ? ?Physical Exam ?Vitals and nursing note reviewed.  ?Constitutional:   ?   General: He is not in acute distress. ?   Appearance: He is well-developed. He is not ill-appearing or toxic-appearing.  ?HENT:  ?   Head: Normocephalic and atraumatic.  ?   Right Ear: Tympanic membrane, ear canal and external ear normal. Tympanic membrane is not erythematous, retracted or bulging.  ?   Left Ear: Tympanic membrane, ear canal and external ear normal. Tympanic membrane is not erythematous, retracted or bulging.  ?    Nose: Nose normal.  ?   Right Sinus: No maxillary sinus tenderness or frontal sinus tenderness.  ?   Left Sinus: No maxillary sinus tenderness or frontal sinus tenderness.  ?   Mouth/Throat:  ?   Pharynx: Uvula midline. No posterior oropharyngeal erythema.  ?Eyes:  ?   General: Lids are normal.     ?   Right eye: Hordeolum present.  ?   Extraocular Movements: Extraocular movements intact.  ?   Conjunctiva/sclera: Conjunctivae normal.  ?Neck:  ?   Trachea: Trachea normal.  ?Cardiovascular:  ?   Rate and Rhythm: Normal rate and regular rhythm.  ?   Pulses: Normal pulses.  ?   Heart sounds: Normal heart sounds, S1 normal and S2 normal.  ?Pulmonary:  ?   Effort: Pulmonary effort is normal.  ?   Breath sounds: Normal breath sounds. No decreased breath sounds, wheezing, rhonchi or rales.  ?Lymphadenopathy:  ?   Cervical: No cervical adenopathy.  ?Skin: ?   General: Skin is warm and dry.  ?Neurological:  ?   Mental Status: He is alert.  ?   GCS: GCS eye subscore is 4. GCS verbal subscore is 5. GCS motor subscore is 6.  ?Psychiatric:     ?   Speech: Speech normal.     ?   Behavior: Behavior normal. Behavior is cooperative.  ? ? ?Assessment and Plan:  ? ?Hordeolum externum of right lower eyelid ?No red flags however do suspect that his contacts are causing irritation to this -- informed patient to ideally wear contacts as least amount of time as possible  ?Follow up if new/worsening symptoms or concerns occur  ?Start topical ofloxacin drops, 1 drop four times daily given contact use ?Literature and worsening precautions advised ?EOM intact without significant pain ? ? ?I,Havlyn C Ratchford,acting as a scribe for , PA.,have documented all relevant documentation on the behalf of [employer], PA,as directed by  Jarold Motto, PA while in the presence of Jarold Motto, Jarold Motto. ? ?[place], PA, have reviewed all documentation for this visit. The documentation on 06/17/21 for the exam, diagnosis,  procedures, and orders are all accurate and complete. ? ? ?08/17/21, PA-C ? ?

## 2021-06-25 ENCOUNTER — Other Ambulatory Visit: Payer: Self-pay | Admitting: Orthopaedic Surgery

## 2021-06-25 DIAGNOSIS — M25512 Pain in left shoulder: Secondary | ICD-10-CM | POA: Diagnosis not present

## 2021-07-04 ENCOUNTER — Telehealth: Payer: Self-pay

## 2021-07-04 NOTE — Telephone Encounter (Signed)
Patient is scheduled for 07/08/21.  ?

## 2021-07-04 NOTE — Telephone Encounter (Signed)
Please call and schedule pt OV for surgery clearance. ?

## 2021-07-05 ENCOUNTER — Ambulatory Visit
Admission: RE | Admit: 2021-07-05 | Discharge: 2021-07-05 | Disposition: A | Discharge status: Home / Self Care | Payer: BC Managed Care – PPO | Source: Ambulatory Visit | Attending: Orthopaedic Surgery | Admitting: Orthopaedic Surgery

## 2021-07-05 DIAGNOSIS — M75102 Unspecified rotator cuff tear or rupture of left shoulder, not specified as traumatic: Secondary | ICD-10-CM | POA: Diagnosis not present

## 2021-07-05 DIAGNOSIS — M25512 Pain in left shoulder: Secondary | ICD-10-CM

## 2021-07-05 DIAGNOSIS — M19012 Primary osteoarthritis, left shoulder: Secondary | ICD-10-CM | POA: Diagnosis not present

## 2021-07-08 ENCOUNTER — Encounter: Payer: Self-pay | Admitting: Family Medicine

## 2021-07-08 ENCOUNTER — Ambulatory Visit (INDEPENDENT_AMBULATORY_CARE_PROVIDER_SITE_OTHER): Payer: BC Managed Care – PPO | Admitting: Family Medicine

## 2021-07-08 VITALS — BP 130/74 | HR 64 | Temp 98.1°F | Ht 75.0 in | Wt 235.0 lb

## 2021-07-08 DIAGNOSIS — E785 Hyperlipidemia, unspecified: Secondary | ICD-10-CM

## 2021-07-08 DIAGNOSIS — Z0000 Encounter for general adult medical examination without abnormal findings: Secondary | ICD-10-CM | POA: Diagnosis not present

## 2021-07-08 DIAGNOSIS — Z23 Encounter for immunization: Secondary | ICD-10-CM | POA: Diagnosis not present

## 2021-07-08 DIAGNOSIS — I10 Essential (primary) hypertension: Secondary | ICD-10-CM | POA: Diagnosis not present

## 2021-07-08 DIAGNOSIS — F3342 Major depressive disorder, recurrent, in full remission: Secondary | ICD-10-CM

## 2021-07-08 DIAGNOSIS — Z125 Encounter for screening for malignant neoplasm of prostate: Secondary | ICD-10-CM

## 2021-07-08 DIAGNOSIS — E119 Type 2 diabetes mellitus without complications: Secondary | ICD-10-CM | POA: Diagnosis not present

## 2021-07-08 DIAGNOSIS — E1169 Type 2 diabetes mellitus with other specified complication: Secondary | ICD-10-CM

## 2021-07-08 LAB — CBC WITH DIFFERENTIAL/PLATELET
Basophils Absolute: 0 10*3/uL (ref 0.0–0.1)
Basophils Relative: 0.6 % (ref 0.0–3.0)
Eosinophils Absolute: 0.1 10*3/uL (ref 0.0–0.7)
Eosinophils Relative: 1.5 % (ref 0.0–5.0)
HCT: 50.2 % (ref 39.0–52.0)
Hemoglobin: 16.1 g/dL (ref 13.0–17.0)
Lymphocytes Relative: 24.4 % (ref 12.0–46.0)
Lymphs Abs: 1.5 10*3/uL (ref 0.7–4.0)
MCHC: 32 g/dL (ref 30.0–36.0)
MCV: 75.5 fl — ABNORMAL LOW (ref 78.0–100.0)
Monocytes Absolute: 0.7 10*3/uL (ref 0.1–1.0)
Monocytes Relative: 10.7 % (ref 3.0–12.0)
Neutro Abs: 3.9 10*3/uL (ref 1.4–7.7)
Neutrophils Relative %: 62.8 % (ref 43.0–77.0)
Platelets: 237 10*3/uL (ref 150.0–400.0)
RBC: 6.65 Mil/uL — ABNORMAL HIGH (ref 4.22–5.81)
RDW: 19.1 % — ABNORMAL HIGH (ref 11.5–15.5)
WBC: 6.2 10*3/uL (ref 4.0–10.5)

## 2021-07-08 LAB — COMPREHENSIVE METABOLIC PANEL
ALT: 31 U/L (ref 0–53)
AST: 28 U/L (ref 0–37)
Albumin: 4.5 g/dL (ref 3.5–5.2)
Alkaline Phosphatase: 80 U/L (ref 39–117)
BUN: 15 mg/dL (ref 6–23)
CO2: 34 mEq/L — ABNORMAL HIGH (ref 19–32)
Calcium: 9.5 mg/dL (ref 8.4–10.5)
Chloride: 96 mEq/L (ref 96–112)
Creatinine, Ser: 0.94 mg/dL (ref 0.40–1.50)
GFR: 88.96 mL/min (ref >60.00)
Glucose, Bld: 134 mg/dL — ABNORMAL HIGH (ref 70–99)
Potassium: 4 mEq/L (ref 3.5–5.1)
Sodium: 137 mEq/L (ref 135–145)
Total Bilirubin: 0.6 mg/dL (ref 0.2–1.2)
Total Protein: 7.3 g/dL (ref 6.0–8.3)

## 2021-07-08 LAB — PSA: PSA: 0.6 ng/mL (ref 0.10–4.00)

## 2021-07-08 LAB — HEMOGLOBIN A1C: Hgb A1c MFr Bld: 7.2 % — ABNORMAL HIGH (ref 4.6–6.5)

## 2021-07-08 NOTE — Patient Instructions (Addendum)
Please stop by lab before you go ?If you have mychart- we will send your results within 3 business days of [place] receiving them.  ?If you do not have mychart- we will call you about results within 5 business days of [place] receiving them.  ?*please also note that you will see labs on mychart as soon as they post. I will later go in and write notes on them- will say "notes from Dr. Durene Cal"  ? ?Prevnar 20 today ? ?Recommended follow up: Return in about 6 months (around 01/07/2022) for followup or sooner if needed.Schedule b4 you leave. ?

## 2021-07-08 NOTE — Addendum Note (Signed)
Addended by: Lieutenant Diego A on: 07/08/2021 11:20 AM ? ? Modules accepted: Orders ? ?

## 2021-07-16 ENCOUNTER — Telehealth: Payer: Self-pay | Admitting: Family Medicine

## 2021-07-16 NOTE — Telephone Encounter (Signed)
Pt states he would prefer to take Jardiance in place of Metformin. Also is asking if Yong Channel has heard from surgeon who is performing shoulder surgery for pt and did Hunter give his approval. Please call back at [phone] ?

## 2021-07-16 NOTE — Telephone Encounter (Signed)
I have not seen form ? ?Please see last result note- you noted you were waiting for his response on the medicine ?

## 2021-07-16 NOTE — Telephone Encounter (Signed)
See below, I have not seen anything regarding surgery. ?

## 2021-07-17 MED ORDER — EMPAGLIFLOZIN 25 MG PO TABS: 25.0000 mg | ORAL_TABLET | Freq: Every day | ORAL | 3 refills | Status: DC

## 2021-07-17 NOTE — Telephone Encounter (Signed)
Jardiance sent to pharmacy.

## 2021-07-17 NOTE — Telephone Encounter (Signed)
Just received the form. It is in provider's box ?

## 2021-07-19 ENCOUNTER — Telehealth: Payer: Self-pay | Admitting: Family Medicine

## 2021-07-19 DIAGNOSIS — M2550 Pain in unspecified joint: Secondary | ICD-10-CM

## 2021-07-19 NOTE — Telephone Encounter (Signed)
Pt states Dr. Austin Miles office needs referral for left shoulder surgery.  ?Pt states this was discussed at last appointment. ? ? ? ? ?

## 2021-07-19 NOTE — Telephone Encounter (Signed)
Referral has been placed to Dr. Everardo Pacific.  ?

## 2021-07-19 NOTE — Telephone Encounter (Signed)
Surgery clearance form faxed. ?

## 2021-07-19 NOTE — Telephone Encounter (Signed)
Patient called back and need medical clearance not a referral. Patient states the provider sent the paper through epic please advise.   ?

## 2021-07-22 NOTE — Telephone Encounter (Signed)
Patient has called back stating the box stating patient was cleared for surgery was not checked.   States the form has been faxed back.  Is requesting updated form to be sent.    Please give patient a call at [phone] with status update.    States is needing surgery to get scheduled for July and needs form faxed into the office soon.

## 2021-07-24 NOTE — Telephone Encounter (Signed)
Surgery clearance form refaxed on 07/24/21, called and pt aware. ?

## 2021-07-29 ENCOUNTER — Other Ambulatory Visit: Payer: Self-pay | Admitting: Family Medicine

## 2021-08-06 ENCOUNTER — Other Ambulatory Visit: Payer: Self-pay | Admitting: Gastroenterology

## 2021-08-06 ENCOUNTER — Other Ambulatory Visit (HOSPITAL_COMMUNITY): Payer: Self-pay | Admitting: Gastroenterology

## 2021-08-06 DIAGNOSIS — R194 Change in bowel habit: Secondary | ICD-10-CM | POA: Diagnosis not present

## 2021-08-06 DIAGNOSIS — R110 Nausea: Secondary | ICD-10-CM | POA: Diagnosis not present

## 2021-08-06 DIAGNOSIS — Z1211 Encounter for screening for malignant neoplasm of colon: Secondary | ICD-10-CM | POA: Diagnosis not present

## 2021-08-06 DIAGNOSIS — R152 Fecal urgency: Secondary | ICD-10-CM | POA: Diagnosis not present

## 2021-08-26 DIAGNOSIS — K648 Other hemorrhoids: Secondary | ICD-10-CM | POA: Diagnosis not present

## 2021-08-26 DIAGNOSIS — D123 Benign neoplasm of transverse colon: Secondary | ICD-10-CM | POA: Diagnosis not present

## 2021-08-26 DIAGNOSIS — K635 Polyp of colon: Secondary | ICD-10-CM | POA: Diagnosis not present

## 2021-08-26 DIAGNOSIS — Z1211 Encounter for screening for malignant neoplasm of colon: Secondary | ICD-10-CM | POA: Diagnosis not present

## 2021-08-26 LAB — HM COLONOSCOPY

## 2021-09-02 ENCOUNTER — Ambulatory Visit (HOSPITAL_COMMUNITY): Payer: BC Managed Care – PPO

## 2021-09-02 ENCOUNTER — Encounter (HOSPITAL_COMMUNITY): Payer: Self-pay

## 2021-09-04 ENCOUNTER — Encounter (INDEPENDENT_AMBULATORY_CARE_PROVIDER_SITE_OTHER): Payer: Self-pay

## 2021-09-11 DIAGNOSIS — M6281 Muscle weakness (generalized): Secondary | ICD-10-CM | POA: Diagnosis not present

## 2021-09-11 DIAGNOSIS — M25612 Stiffness of left shoulder, not elsewhere classified: Secondary | ICD-10-CM | POA: Diagnosis not present

## 2021-09-11 DIAGNOSIS — M19012 Primary osteoarthritis, left shoulder: Secondary | ICD-10-CM | POA: Diagnosis not present

## 2021-09-18 DIAGNOSIS — M19012 Primary osteoarthritis, left shoulder: Secondary | ICD-10-CM | POA: Diagnosis not present

## 2021-09-18 DIAGNOSIS — G8918 Other acute postprocedural pain: Secondary | ICD-10-CM | POA: Diagnosis not present

## 2021-10-25 ENCOUNTER — Other Ambulatory Visit: Payer: Self-pay | Admitting: Family Medicine

## 2021-12-05 DIAGNOSIS — E291 Testicular hypofunction: Secondary | ICD-10-CM | POA: Diagnosis not present

## 2021-12-05 DIAGNOSIS — Z79890 Hormone replacement therapy (postmenopausal): Secondary | ICD-10-CM | POA: Diagnosis not present

## 2021-12-05 DIAGNOSIS — Z1329 Encounter for screening for other suspected endocrine disorder: Secondary | ICD-10-CM | POA: Diagnosis not present

## 2021-12-09 ENCOUNTER — Encounter: Payer: Self-pay | Admitting: *Deleted

## 2021-12-09 DIAGNOSIS — M2550 Pain in unspecified joint: Secondary | ICD-10-CM | POA: Diagnosis not present

## 2021-12-09 DIAGNOSIS — Z6832 Body mass index (BMI) 32.0-32.9, adult: Secondary | ICD-10-CM | POA: Diagnosis not present

## 2021-12-09 DIAGNOSIS — G479 Sleep disorder, unspecified: Secondary | ICD-10-CM | POA: Diagnosis not present

## 2021-12-09 DIAGNOSIS — E291 Testicular hypofunction: Secondary | ICD-10-CM | POA: Diagnosis not present

## 2021-12-27 ENCOUNTER — Encounter: Payer: Self-pay | Admitting: Internal Medicine

## 2021-12-27 ENCOUNTER — Ambulatory Visit (INDEPENDENT_AMBULATORY_CARE_PROVIDER_SITE_OTHER): Payer: BC Managed Care – PPO | Admitting: Internal Medicine

## 2021-12-27 VITALS — BP 113/85 | HR 87 | Temp 98.3°F | Resp 14 | Ht 75.0 in | Wt 242.2 lb

## 2021-12-27 DIAGNOSIS — K5730 Diverticulosis of large intestine without perforation or abscess without bleeding: Secondary | ICD-10-CM | POA: Insufficient documentation

## 2021-12-27 DIAGNOSIS — Z1211 Encounter for screening for malignant neoplasm of colon: Secondary | ICD-10-CM | POA: Insufficient documentation

## 2021-12-27 DIAGNOSIS — R1031 Right lower quadrant pain: Secondary | ICD-10-CM | POA: Diagnosis not present

## 2021-12-27 DIAGNOSIS — R052 Subacute cough: Secondary | ICD-10-CM

## 2021-12-27 DIAGNOSIS — R1032 Left lower quadrant pain: Secondary | ICD-10-CM

## 2021-12-27 MED ORDER — BENZONATATE 200 MG PO CAPS: 200.0000 mg | ORAL_CAPSULE | Freq: Two times a day (BID) | ORAL | 0 refills | Status: DC | PRN

## 2021-12-27 MED ORDER — LORATADINE 10 MG PO TABS: 10.0000 mg | ORAL_TABLET | Freq: Every day | ORAL | 11 refills | Status: DC

## 2021-12-27 MED ORDER — DEXTROMETHORPHAN-GUAIFENESIN 10-100 MG/5ML PO LIQD: 5.0000 mL | ORAL | 1 refills | Status: DC | PRN

## 2021-12-27 NOTE — Progress Notes (Signed)
[place] [facility]:  [phone]   Routine Medical Office Visit  Patient:  Jason Lopez      Age: 61 y.o.       Sex:  male  Date:   12/27/2021  PCP:    Marin Olp, [place] Provider: Loralee Pacas, MD  Assessment/Plan:    Subacute cough - Plan: dextromethorphan-guaiFENesin (TUSSIN DM) 10-100 MG/5ML liquid, benzonatate (TESSALON) 200 MG capsule, loratadine (CLARITIN) 10 MG tablet  Inguinal pain of both sides   The patient's presentation of bilateral scrotal and inguinal ligament pain, along with an enlarged scrotum and a history of an umbilical hernia, suggests a possible inguinal hernia. The exacerbation of pain with certain movements and the absence of a palpable herniation through the inguinal canal, however, make this diagnosis less certain. The differential diagnosis includes inguinal hernia, epididymitis, testicular torsion, varicocele, hydrocele, orchitis, prostatitis, urinary tract infection, and kidney stone.  I suspect he does have an inguinal hernia even though I could not feel 1 and I gave him a handout for that and I also try to treat his cough and explained how that could have caused it to flareup or get worse  Dx:  Ultrasound of the scrotum and inguinal region Urinalysis  We will hold off on ordering.  For now I just give him a handout on inguinal herniation and inguinal ligament strains and if it does not get better we will do the ultrasound of the scrotum and the urinalysis and I will share this idea with Dr. Yong Channel and I agreed to order these tests without an appointment if he just calls me back and let me know he wants some explained that if it suddenly gets much worse he is to go to the ER for the test    Subjective:   Jason Lopez is a 61 y.o. male with PMH significant for: Past Medical History:  Diagnosis Date   COLONIC POLYPS, HX OF 12/09/2006   ERECTILE DYSFUNCTION, MILD 08/02/2007   GERD 12/09/2006    HYPERLIPIDEMIA 12/09/2006   HYPERTENSION 12/09/2006   HYPOGONADISM 01/08/2010   PLANTAR FASCIITIS 01/21/2010   RADIAL NERVE INJURY 01/21/2010    He is presenting today with: Chief Complaint  Patient presents with   Groin Pain    In the crease of this area, bilateral. Hurts to cross legs.   Cough     Also sneezing intermittently started about two weeks ago, produces clear mucus.     Additional physician collected history: Reason he is here is pain right in here "gestures to bilateral inguinal ligaments" Pain feels like getting hit in the testicles, aches No idea what cause Started earlier today around 9-10 Only thing prior was needing to urinate There was no groin strain or testicle injury Came on just before the urination The pain is somewhat in the testicles No history of inguinal ligament problems or hernias             Objective:  Physical Exam: BP 113/85 (BP Location: Left Arm, Patient Position: Sitting)   Pulse 87   Temp 98.3 F (36.8 C) (Temporal)   Resp 14   Ht 6\' 3"  (1.905 m)   Wt 242 lb 3.2 oz (109.9 kg)   SpO2 92%   BMI 30.27 kg/m   He  is a polite, friendly, and genuine person Constitutional: NAD, AAO, not ill-appearing  Neuro: alert, no focal deficit obvious, articulate speech Psych: normal mood, behavior, thought content  Problem specific physical exam findings:  Enlarged scrotum, occasional throaty dry cough, normal sinus mucosa. Poor mallampati. Mod tender Pain bilateral inguinal canal

## 2021-12-27 NOTE — Patient Instructions (Signed)
Inguinal Hernia, Adult An inguinal hernia develops when fat or the intestines push through a weak spot in a muscle where the leg meets the lower abdomen (groin). This creates a bulge. This kind of hernia could also be: In the scrotum, if you are male. In folds of skin around the vagina, if you are male. There are three types of inguinal hernias: Hernias that can be pushed back into the abdomen (are reducible). This type rarely causes pain. Hernias that are not reducible (are incarcerated). Hernias that are not reducible and lose their blood supply (are strangulated). This type of hernia requires emergency surgery. What are the causes? This condition is caused by having a weak spot in the muscles or tissues in your groin. This develops over time. The hernia may poke through the weak spot when you suddenly strain your lower abdominal muscles, such as when you: Lift a heavy object. Strain to have a bowel movement. Constipation can lead to straining. Cough. What increases the risk? This condition is more likely to develop in: Males. Pregnant females. People who: Are overweight. Work in jobs that require long periods of standing or heavy lifting. Have had an inguinal hernia before. Smoke or have lung disease. These factors can lead to long-term (chronic) coughing. What are the signs or symptoms? Symptoms may depend on the size of the hernia. Often, a small inguinal hernia has no symptoms. Symptoms of a larger hernia may include: A bulge in the groin area. This is easier to see when standing. It might not be visible when lying down. Pain or burning in the groin. This may get worse when lifting, straining, or coughing. A dull ache or a feeling of pressure in the groin. An unusual bulge in the scrotum, in males. Symptoms of a strangulated inguinal hernia may include: A bulge in your groin that is very painful and tender to the touch. A bulge that turns red or purple. Fever, nausea, and  vomiting. Inability to have a bowel movement or to pass gas. How is this diagnosed? This condition is diagnosed based on your symptoms, your medical history, and a physical exam. Your health care provider may feel your groin area and ask you to cough. How is this treated? Treatment depends on the size of your hernia and whether you have symptoms. If you do not have symptoms, your health care provider may have you watch your hernia carefully and have you come in for follow-up visits. If your hernia is large or if you have symptoms, you may need surgery to repair the hernia. Follow these instructions at home: Lifestyle Avoid lifting heavy objects. Avoid standing for long periods of time. Do not use any products that contain nicotine or tobacco. These products include cigarettes, chewing tobacco, and vaping devices, such as e-cigarettes. If you need help quitting, ask your health care provider. Maintain a healthy weight. Preventing constipation You may need to take these actions to prevent or treat constipation: Drink enough fluid to keep your urine pale yellow. Take over-the-counter or prescription medicines. Eat foods that are high in fiber, such as beans, whole grains, and fresh fruits and vegetables. Limit foods that are high in fat and processed sugars, such as fried or sweet foods. General instructions You may try to push the hernia back in place by very gently pressing on it while lying down. Do not try to force the bulge back in if it will not push in easily. Watch your hernia for any changes in shape, size, or   color. Get help right away if you notice any changes. Take over-the-counter and prescription medicines only as told by your health care provider. Keep all follow-up visits. This is important. Contact a health care provider if: You have a fever or chills. You develop new symptoms. Your symptoms get worse. Get help right away if: You have pain in your groin that suddenly gets  worse. You have a bulge in your groin that: Suddenly gets bigger and does not get smaller. Becomes red or purple or painful to the touch. You are a man and you have a sudden pain in your scrotum, or the size of your scrotum suddenly changes. You cannot push the hernia back in place by very gently pressing on it when you are lying down. You have nausea or vomiting that does not go away. You have a fast heartbeat. You cannot have a bowel movement or pass gas. These symptoms may represent a serious problem that is an emergency. Do not wait to see if the symptoms will go away. Get medical help right away. Call your local emergency services (911 in the [place] Summary An inguinal hernia develops when fat or the intestines push through a weak spot in a muscle where your leg meets your lower abdomen (groin). This condition is caused by having a weak spot in muscles or tissues in your groin. Symptoms may depend on the size of the hernia, and they may include pain or swelling in your groin. A small inguinal hernia often has no symptoms. Treatment may not be needed if you do not have symptoms. If you have symptoms or a large hernia, you may need surgery to repair the hernia. Avoid lifting heavy objects. Also, avoid standing for long periods of time. This information is not intended to replace advice given to you by your health care provider. Make sure you discuss any questions you have with your health care provider. Document Revised: 11/01/2019 Document Reviewed: 11/01/2019 Elsevier Patient Education  2023 [employer]  

## 2021-12-30 DIAGNOSIS — M19012 Primary osteoarthritis, left shoulder: Secondary | ICD-10-CM | POA: Diagnosis not present

## 2021-12-30 DIAGNOSIS — M6281 Muscle weakness (generalized): Secondary | ICD-10-CM | POA: Diagnosis not present

## 2021-12-30 DIAGNOSIS — M25612 Stiffness of left shoulder, not elsewhere classified: Secondary | ICD-10-CM | POA: Diagnosis not present

## 2022-01-06 ENCOUNTER — Encounter: Payer: Self-pay | Admitting: Family Medicine

## 2022-01-06 ENCOUNTER — Other Ambulatory Visit: Payer: Self-pay | Admitting: Family Medicine

## 2022-01-06 ENCOUNTER — Ambulatory Visit (INDEPENDENT_AMBULATORY_CARE_PROVIDER_SITE_OTHER): Payer: BC Managed Care – PPO | Admitting: Family Medicine

## 2022-01-06 VITALS — BP 128/80 | HR 76 | Temp 98.0°F | Ht 75.0 in | Wt 246.2 lb

## 2022-01-06 DIAGNOSIS — E119 Type 2 diabetes mellitus without complications: Secondary | ICD-10-CM

## 2022-01-06 DIAGNOSIS — G720 Drug-induced myopathy: Secondary | ICD-10-CM

## 2022-01-06 DIAGNOSIS — E785 Hyperlipidemia, unspecified: Secondary | ICD-10-CM | POA: Diagnosis not present

## 2022-01-06 DIAGNOSIS — E1169 Type 2 diabetes mellitus with other specified complication: Secondary | ICD-10-CM

## 2022-01-06 DIAGNOSIS — Z23 Encounter for immunization: Secondary | ICD-10-CM

## 2022-01-06 DIAGNOSIS — I10 Essential (primary) hypertension: Secondary | ICD-10-CM

## 2022-01-06 DIAGNOSIS — R7303 Prediabetes: Secondary | ICD-10-CM | POA: Diagnosis not present

## 2022-01-06 LAB — COMPREHENSIVE METABOLIC PANEL
ALT: 33 U/L (ref 0–53)
AST: 29 U/L (ref 0–37)
Albumin: 4.7 g/dL (ref 3.5–5.2)
Alkaline Phosphatase: 92 U/L (ref 39–117)
BUN: 14 mg/dL (ref 6–23)
CO2: 32 mEq/L (ref 19–32)
Calcium: 10.3 mg/dL (ref 8.4–10.5)
Chloride: 95 mEq/L — ABNORMAL LOW (ref 96–112)
Creatinine, Ser: 0.91 mg/dL (ref 0.40–1.50)
GFR: 92.16 mL/min (ref >60.00)
Glucose, Bld: 151 mg/dL — ABNORMAL HIGH (ref 70–99)
Potassium: 3.7 mEq/L (ref 3.5–5.1)
Sodium: 136 mEq/L (ref 135–145)
Total Bilirubin: 0.5 mg/dL (ref 0.2–1.2)
Total Protein: 7.5 g/dL (ref 6.0–8.3)

## 2022-01-06 LAB — LIPID PANEL
Cholesterol: 258 mg/dL — ABNORMAL HIGH (ref 0–200)
HDL: 54.8 mg/dL (ref >39.00)
LDL Cholesterol: 167 mg/dL — ABNORMAL HIGH (ref 0–99)
NonHDL: 202.88
Total CHOL/HDL Ratio: 5
Triglycerides: 180 mg/dL — ABNORMAL HIGH (ref 0.0–149.0)
VLDL: 36 mg/dL (ref 0.0–40.0)

## 2022-01-06 LAB — CBC WITH DIFFERENTIAL/PLATELET
Basophils Absolute: 0 10*3/uL (ref 0.0–0.1)
Basophils Relative: 0.6 % (ref 0.0–3.0)
Eosinophils Absolute: 0.1 10*3/uL (ref 0.0–0.7)
Eosinophils Relative: 2 % (ref 0.0–5.0)
HCT: 48.9 % (ref 39.0–52.0)
Hemoglobin: 16.3 g/dL (ref 13.0–17.0)
Lymphocytes Relative: 28.7 % (ref 12.0–46.0)
Lymphs Abs: 1.7 10*3/uL (ref 0.7–4.0)
MCHC: 33.2 g/dL (ref 30.0–36.0)
MCV: 80.2 fl (ref 78.0–100.0)
Monocytes Absolute: 0.6 10*3/uL (ref 0.1–1.0)
Monocytes Relative: 11.1 % (ref 3.0–12.0)
Neutro Abs: 3.3 10*3/uL (ref 1.4–7.7)
Neutrophils Relative %: 57.6 % (ref 43.0–77.0)
Platelets: 212 10*3/uL (ref 150.0–400.0)
RBC: 6.1 Mil/uL — ABNORMAL HIGH (ref 4.22–5.81)
RDW: 15.3 % (ref 11.5–15.5)
WBC: 5.8 10*3/uL (ref 4.0–10.5)

## 2022-01-06 LAB — MICROALBUMIN / CREATININE URINE RATIO
Creatinine,U: 28.2 mg/dL
Microalb Creat Ratio: 3.2 mg/g (ref 0.0–30.0)
Microalb, Ur: 0.9 mg/dL (ref 0.0–1.9)

## 2022-01-06 LAB — HEMOGLOBIN A1C: Hgb A1c MFr Bld: 7.4 % — ABNORMAL HIGH (ref 4.6–6.5)

## 2022-01-06 LAB — TSH: TSH: 1.2 u[IU]/mL (ref 0.35–5.50)

## 2022-01-06 MED ORDER — AMOXICILLIN-POT CLAVULANATE 875-125 MG PO TABS: 1.0000 | ORAL_TABLET | Freq: Two times a day (BID) | ORAL | 0 refills | Status: AC

## 2022-01-06 MED ORDER — OZEMPIC (0.25 OR 0.5 MG/DOSE) 2 MG/1.5ML ~~LOC~~ SOPN: PEN_INJECTOR | SUBCUTANEOUS | 0 refills | Status: DC

## 2022-01-06 NOTE — Patient Instructions (Addendum)
Covid shot -opting out for now  Call me if you change your mind on seeing therapist or podiatry if you need referral. If any thoughts of self harm contact us immediately   Hoping augmentin will help sinuses- let us know if you fail to continue to improve  Please stop by lab before you go If you have mychart- we will send your results within 3 business days of [place] receiving them.  If you do not have mychart- we will call you about results within 5 business days of [place] receiving them.  *please also note that you will see labs on mychart as soon as they post. I will later go in and write notes on them- will say "notes from Dr. Yong Channel"   Recommended follow up: Return in about 6 months (around 07/08/2022) for physical or sooner if needed.Schedule b4 you leave.

## 2022-01-06 NOTE — Progress Notes (Signed)
Phone [phone] In person visit   Subjective:   Jason Lopez is a 61 y.o. year old very pleasant male patient who presents for/with See problem oriented charting Chief Complaint  Patient presents with   Follow-up    Pt wants to talk to provider about rt big toe, says when he touches it, its sore. Even the sheets touching causes pain. Pt says he saw Dr. Jon Billings, a week ago and he mentioned that the pt eardrum was scarred, wanted to get your input on it. Pt also wants to discuss ozempic, I gave pt flu shot in lt arm,   Diabetes   Past Medical History-  Patient Active Problem List   Diagnosis Date Noted   Diabetes mellitus (HCC) 12/27/2014    Priority: High   Drug-induced myopathy 12/05/2019    Priority: Medium    Obesity 04/17/2014    Priority: Medium    Major depression, recurrent, full remission (HCC) 04/22/2010    Priority: Medium    Hypogonadism in male 01/08/2010    Priority: Medium    ERECTILE DYSFUNCTION, MILD 08/02/2007    Priority: Medium    Hyperlipidemia associated with type 2 diabetes mellitus (HCC) 12/09/2006    Priority: Medium    Essential hypertension 12/09/2006    Priority: Medium    Seborrheic dermatitis 04/17/2014    Priority: Low   PLANTAR FASCIITIS 01/21/2010    Priority: Low   RADIAL NERVE INJURY 01/21/2010    Priority: Low   Allergic rhinitis 12/09/2006    Priority: Low   GERD 12/09/2006    Priority: Low   History of colonic polyps 12/09/2006    Priority: Low   Colon cancer screening 12/27/2021   Diverticulosis of colon 12/27/2021   RUQ abdominal pain 05/14/2015   Tendinopathy of right rotator cuff 12/27/2014    Medications- reviewed and updated Current Outpatient Medications  Medication Sig Dispense Refill   amoxicillin-clavulanate (AUGMENTIN) 875-125 MG tablet Take 1 tablet by mouth 2 (two) times daily for 7 days. 14 tablet 0   anastrozole (ARIMIDEX) 1 MG tablet Take 1 tablet by mouth. Every other week  0   benzonatate (TESSALON)  200 MG capsule Take 1 capsule (200 mg total) by mouth 2 (two) times daily as needed for cough. 20 capsule 0   DULoxetine (CYMBALTA) 60 MG capsule TAKE ONE CAPSULE EACH DAY 90 capsule 1   empagliflozin (JARDIANCE) 25 MG TABS tablet Take 1 tablet (25 mg total) by mouth daily before breakfast. 90 tablet 3   esomeprazole (NEXIUM) 20 MG capsule Take 40 mg by mouth daily at 12 noon. OTC     fluticasone (FLONASE) 50 MCG/ACT nasal spray PLACE 2 SPRAYS INTO THE NOSE DAILY. 16 g 2   lisinopril-hydrochlorothiazide (ZESTORETIC) 20-25 MG tablet TAKE ONE TABLET DAILY 90 tablet 3   loratadine (CLARITIN) 10 MG tablet Take 1 tablet (10 mg total) by mouth daily. 30 tablet 11   Multiple Vitamin (MULTIVITAMIN) tablet Take 1 tablet by mouth daily.     tretinoin (RETIN-A) 0.1 % cream SMARTSIG:Topical Every Evening     No current facility-administered medications for this visit.     Objective:  BP 128/80   Pulse 76   Temp 98 F (36.7 C) (Temporal)   Ht 6\' 3"  (1.905 m)   Wt 246 lb 3.2 oz (111.7 kg)   SpO2 94%   BMI 30.77 kg/m  Gen: NAD, resting comfortably CV: RRR no murmurs rubs or gallops Lungs: CTAB no crackles, wheeze, rhonchi Abdomen: soft/nontender/nondistended/normal bowel sounds.  Ext: no edema Skin: warm, dry     Assessment and Plan   #Subacute cough- saw Dr. Jon Billings last week. Cough improving- still with some sinus drainage and pressure. Tried tessalon mucinex-dm. At least 3 weeks of symptoms. With 3 weeks of symptoms without resolution- will trial augmentin- has done well in the past with this. If fails to improve within 10 days asked him to see [place] again  -Was getting bilateral groin pain starting on date of visit and lasted through weekend- worse with coughing. Was worse with lifting- thought to be inguinal hernia though couldn't clinically detect- he had wanted to hold off on ultrasound of the groin. He states has essentially resolved- will let us know if recurs.   #Right great toe pain-   at medial nail plate S: Patient has noted soreness with touching the toe right IP joint.  Even the sheets touching this can cause pain. He tripped over wrought iron ottoman  over 2 years ago. IP joint. Shoes aggravate as well A/P: appears to have mild ingrown toenail- wants to try soaks and lifting toenail- offered podiatry referral but he declines for now- will reach out if he changes his mind   # Right eardrum- was told scarring last week by Dr. Jon Billings- seen for a cough. Wonder if was more irritated due to sinus infection- mildly opaque today- will see [place] back if any pain or failure to improve with augmentin  # Diabetes S: Medication: Jardiance 25 mg increased from 10 mg at last visit with elevated A1c - Patient has questions about Ozempic- weight up 11 lbs from last visit  -steroid injections a month ago Exercise and diet- not exercising Lab Results  Component Value Date   HGBA1C 7.2 (H) 07/08/2021   HGBA1C 7.3 (H) 12/03/2020   HGBA1C 6.5 06/21/2020  A/P: wants to consider ozempic if a1c still above 7- very reasonable/ discussed benefits and risks   #hypertension S: medication: Lisinopril hydrochlorothiazide 20-25 mg, spironolactone 25 mg BP Readings from Last 3 Encounters:  01/06/22 128/80  12/27/21 113/85  07/08/21 130/74  A/P: Controlled. Continue current medications.    # Depression S: Medication:Cymbalta 60 mg. 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 Worries about his friends and their health as well.     01/06/2022   10:22 AM 07/08/2021   10:08 AM 12/05/2019   10:18 AM  Depression screen PHQ 2/9  Decreased Interest 0 0 0  Down, Depressed, Hopeless 1 0 0  PHQ - 2 Score 1 0 0  Altered sleeping 3 0 0  Tired, decreased energy 3 0 0  Change in appetite 3 0 0  Feeling bad or failure about yourself  0 0 0  Trouble concentrating 3 1 0  Moving slowly or fidgety/restless 0 0 0  Suicidal thoughts 0 0 0  PHQ-9 Score 13 1 0  Difficult doing  work/chores Somewhat difficult Not difficult at all Not difficult at all  A/P: PHQ9 mildly elevated on cymbalta 60 mg- offered adding therapy he wants to continue current meds for now and will call me if changes mind   #hyperlipidemia S: Medication:Joint pain on pulse dose Crestor, atorvastatin-also did not tolerate Vytorin Lab Results  Component Value Date   CHOL 253 (H) 12/03/2020   HDL 58.80 12/03/2020   LDLCALC 126 (H) 12/05/2019   LDLDIRECT 161.0 12/03/2020   TRIG 368.0 (H) 12/03/2020   CHOLHDL 4 12/03/2020   A/P: update today- unlikely to start statin-  mainly needs to work on lifestyle choices   Recommended follow up: Return in about 6 months (around 07/08/2022) for physical or sooner if needed.Schedule b4 you leave.  Lab/Order associations:   ICD-10-CM   1. Type 2 diabetes mellitus without complication, without long-term current use of insulin (HCC)  E11.9 CBC with Differential/Platelet    Comprehensive metabolic panel    Lipid panel    Hemoglobin A1c    Microalbumin / creatinine urine ratio    2. Essential hypertension  I10     3. Hyperlipidemia associated with type 2 diabetes mellitus (HCC)  E11.69 Lipid panel   E78.5 TSH    4. Need for immunization against influenza  Z23 Flu Vaccine QUAD 23mo+IM (Fluarix, Fluzone & Alfiuria Quad PF)    5. Drug-induced myopathy  G72.0      Meds ordered this encounter  Medications   amoxicillin-clavulanate (AUGMENTIN) 875-125 MG tablet    Sig: Take 1 tablet by mouth 2 (two) times daily for 7 days.    Dispense:  14 tablet    Refill:  0    Return precautions advised.  Garret Reddish, MD

## 2022-02-17 DIAGNOSIS — M2011 Hallux valgus (acquired), right foot: Secondary | ICD-10-CM | POA: Diagnosis not present

## 2022-02-17 DIAGNOSIS — E119 Type 2 diabetes mellitus without complications: Secondary | ICD-10-CM | POA: Diagnosis not present

## 2022-02-17 DIAGNOSIS — L03032 Cellulitis of left toe: Secondary | ICD-10-CM | POA: Diagnosis not present

## 2022-02-17 DIAGNOSIS — L02611 Cutaneous abscess of right foot: Secondary | ICD-10-CM | POA: Diagnosis not present

## 2022-02-17 DIAGNOSIS — B351 Tinea unguium: Secondary | ICD-10-CM | POA: Diagnosis not present

## 2022-02-21 DIAGNOSIS — M25511 Pain in right shoulder: Secondary | ICD-10-CM | POA: Diagnosis not present

## 2022-02-21 DIAGNOSIS — M25562 Pain in left knee: Secondary | ICD-10-CM | POA: Diagnosis not present

## 2022-02-21 DIAGNOSIS — M25561 Pain in right knee: Secondary | ICD-10-CM | POA: Diagnosis not present

## 2022-03-03 DIAGNOSIS — M25511 Pain in right shoulder: Secondary | ICD-10-CM | POA: Diagnosis not present

## 2022-03-06 ENCOUNTER — Other Ambulatory Visit: Payer: Self-pay | Admitting: Family Medicine

## 2022-04-10 ENCOUNTER — Other Ambulatory Visit: Payer: Self-pay | Admitting: Family Medicine

## 2022-04-28 ENCOUNTER — Other Ambulatory Visit: Payer: Self-pay | Admitting: Family Medicine

## 2022-05-12 ENCOUNTER — Other Ambulatory Visit: Payer: Self-pay | Admitting: Family Medicine

## 2022-06-27 ENCOUNTER — Other Ambulatory Visit: Payer: Self-pay | Admitting: Family Medicine

## 2022-07-14 ENCOUNTER — Encounter: Payer: BC Managed Care – PPO | Admitting: Family Medicine

## 2022-07-14 DIAGNOSIS — Z79890 Hormone replacement therapy (postmenopausal): Secondary | ICD-10-CM | POA: Diagnosis not present

## 2022-07-14 DIAGNOSIS — E291 Testicular hypofunction: Secondary | ICD-10-CM | POA: Diagnosis not present

## 2022-07-28 DIAGNOSIS — E291 Testicular hypofunction: Secondary | ICD-10-CM | POA: Diagnosis not present

## 2022-07-28 DIAGNOSIS — Z6831 Body mass index (BMI) 31.0-31.9, adult: Secondary | ICD-10-CM | POA: Diagnosis not present

## 2022-07-28 DIAGNOSIS — G479 Sleep disorder, unspecified: Secondary | ICD-10-CM | POA: Diagnosis not present

## 2022-07-28 DIAGNOSIS — M2550 Pain in unspecified joint: Secondary | ICD-10-CM | POA: Diagnosis not present

## 2022-08-12 ENCOUNTER — Other Ambulatory Visit: Payer: Self-pay | Admitting: Family Medicine

## 2022-08-15 DIAGNOSIS — M25511 Pain in right shoulder: Secondary | ICD-10-CM | POA: Diagnosis not present

## 2022-08-15 DIAGNOSIS — M25572 Pain in left ankle and joints of left foot: Secondary | ICD-10-CM | POA: Diagnosis not present

## 2022-08-15 DIAGNOSIS — M25571 Pain in right ankle and joints of right foot: Secondary | ICD-10-CM | POA: Diagnosis not present

## 2022-08-27 ENCOUNTER — Other Ambulatory Visit: Payer: Self-pay | Admitting: Family Medicine

## 2022-09-10 ENCOUNTER — Ambulatory Visit: Payer: BC Managed Care – PPO | Admitting: Sports Medicine

## 2022-09-10 VITALS — BP 124/76 | Ht 74.0 in | Wt 238.0 lb

## 2022-09-10 DIAGNOSIS — M2141 Flat foot [pes planus] (acquired), right foot: Secondary | ICD-10-CM

## 2022-09-10 DIAGNOSIS — M2142 Flat foot [pes planus] (acquired), left foot: Secondary | ICD-10-CM

## 2022-09-10 NOTE — Progress Notes (Signed)
Jason Lopez is here today for custom orthotics secondary to bilateral foot pain.   Patient was fitted for a standard, cushioned, semi-rigid orthotic. The orthotic was heated and afterward the patient stood on the orthotic base positioned on the orthotic stand. The patient was positioned in subtalar neutral position and 10 degrees of ankle dorsiflexion in a weight bearing stance.  Size: 13  Base: None Additional Posting and Padding: Large scaphoid pads  The patient ambulated these, and they were very comfortable.  Malyna Budney A.  Leonor Liv, DO PGY 4, [facility] Fellow  Addendum:  I was the preceptor for this visit and available for immediate consultation.  Norton Blizzard MD Marrianne Mood

## 2022-10-15 ENCOUNTER — Encounter (INDEPENDENT_AMBULATORY_CARE_PROVIDER_SITE_OTHER): Payer: Self-pay

## 2022-10-18 ENCOUNTER — Other Ambulatory Visit: Payer: Self-pay | Admitting: Family Medicine

## 2022-11-04 ENCOUNTER — Encounter: Payer: Self-pay | Admitting: Family Medicine

## 2022-11-04 ENCOUNTER — Ambulatory Visit (INDEPENDENT_AMBULATORY_CARE_PROVIDER_SITE_OTHER): Payer: Self-pay | Admitting: Family Medicine

## 2022-11-04 VITALS — BP 134/86 | HR 73 | Temp 98.7°F | Ht 74.0 in | Wt 241.0 lb

## 2022-11-04 DIAGNOSIS — E785 Hyperlipidemia, unspecified: Secondary | ICD-10-CM

## 2022-11-04 DIAGNOSIS — E119 Type 2 diabetes mellitus without complications: Secondary | ICD-10-CM

## 2022-11-04 DIAGNOSIS — Z125 Encounter for screening for malignant neoplasm of prostate: Secondary | ICD-10-CM

## 2022-11-04 DIAGNOSIS — Z0000 Encounter for general adult medical examination without abnormal findings: Secondary | ICD-10-CM

## 2022-11-04 DIAGNOSIS — Z7984 Long term (current) use of oral hypoglycemic drugs: Secondary | ICD-10-CM

## 2022-11-04 DIAGNOSIS — E1169 Type 2 diabetes mellitus with other specified complication: Secondary | ICD-10-CM

## 2022-11-04 DIAGNOSIS — G720 Drug-induced myopathy: Secondary | ICD-10-CM

## 2022-11-04 DIAGNOSIS — F3341 Major depressive disorder, recurrent, in partial remission: Secondary | ICD-10-CM

## 2022-11-04 DIAGNOSIS — Z636 Dependent relative needing care at home: Secondary | ICD-10-CM

## 2022-11-04 DIAGNOSIS — I10 Essential (primary) hypertension: Secondary | ICD-10-CM

## 2022-11-04 LAB — COMPREHENSIVE METABOLIC PANEL
ALT: 38 U/L (ref 0–53)
AST: 31 U/L (ref 0–37)
Albumin: 4.5 g/dL (ref 3.5–5.2)
Alkaline Phosphatase: 80 U/L (ref 39–117)
BUN: 19 mg/dL (ref 6–23)
CO2: 32 mEq/L (ref 19–32)
Calcium: 9.9 mg/dL (ref 8.4–10.5)
Chloride: 98 mEq/L (ref 96–112)
Creatinine, Ser: 0.91 mg/dL (ref 0.40–1.50)
GFR: 91.63 mL/min (ref >60.00)
Glucose, Bld: 106 mg/dL — ABNORMAL HIGH (ref 70–99)
Potassium: 3.7 mEq/L (ref 3.5–5.1)
Sodium: 139 mEq/L (ref 135–145)
Total Bilirubin: 0.5 mg/dL (ref 0.2–1.2)
Total Protein: 7.2 g/dL (ref 6.0–8.3)

## 2022-11-04 LAB — PSA: PSA: 0.67 ng/mL (ref 0.10–4.00)

## 2022-11-04 LAB — CBC WITH DIFFERENTIAL/PLATELET
Basophils Absolute: 0.1 10*3/uL (ref 0.0–0.1)
Basophils Relative: 0.7 % (ref 0.0–3.0)
Eosinophils Absolute: 0.1 10*3/uL (ref 0.0–0.7)
Eosinophils Relative: 1.3 % (ref 0.0–5.0)
HCT: 52.2 % — ABNORMAL HIGH (ref 39.0–52.0)
Hemoglobin: 17 g/dL (ref 13.0–17.0)
Lymphocytes Relative: 22.7 % (ref 12.0–46.0)
Lymphs Abs: 1.6 10*3/uL (ref 0.7–4.0)
MCHC: 32.5 g/dL (ref 30.0–36.0)
MCV: 79.4 fl (ref 78.0–100.0)
Monocytes Absolute: 0.8 10*3/uL (ref 0.1–1.0)
Monocytes Relative: 10.8 % (ref 3.0–12.0)
Neutro Abs: 4.6 10*3/uL (ref 1.4–7.7)
Neutrophils Relative %: 64.5 % (ref 43.0–77.0)
Platelets: 213 10*3/uL (ref 150.0–400.0)
RBC: 6.57 Mil/uL — ABNORMAL HIGH (ref 4.22–5.81)
RDW: 15.3 % (ref 11.5–15.5)
WBC: 7.1 10*3/uL (ref 4.0–10.5)

## 2022-11-04 LAB — LIPID PANEL
Cholesterol: 292 mg/dL — ABNORMAL HIGH (ref 0–200)
HDL: 52.3 mg/dL (ref >39.00)
Total CHOL/HDL Ratio: 6
Triglycerides: 412 mg/dL — ABNORMAL HIGH (ref 0.0–149.0)

## 2022-11-04 LAB — MICROALBUMIN / CREATININE URINE RATIO
Creatinine,U: 67.6 mg/dL
Microalb Creat Ratio: 2.6 mg/g (ref 0.0–30.0)
Microalb, Ur: 1.7 mg/dL (ref 0.0–1.9)

## 2022-11-04 LAB — LDL CHOLESTEROL, DIRECT: Direct LDL: 231 mg/dL

## 2022-11-04 LAB — HEMOGLOBIN A1C: Hgb A1c MFr Bld: 6.7 % — ABNORMAL HIGH (ref 4.6–6.5)

## 2022-11-04 NOTE — Patient Instructions (Addendum)
Get diabetic eye exam scheduled.  Let us know if you get your flu or COVID vaccine this fall.  Please call [phone] to schedule a visit with  behavioral health - please tell the office you were directly referred by Dr. Sheppard Plumber sometimes can be backed up for up to 2 months so here are some other options to check in on: - [email or web address]  at  [phone] [facility] [email or web address] at [phone]  Please stop by lab before you go If you have mychart- we will send your results within 3 business days of [place] receiving them.  If you do not have mychart- we will call you about results within 5 business days of [place] receiving them.  *please also note that you will see labs on mychart as soon as they post. I will later go in and write notes on them- will say "notes from Dr. Durene Cal"    Recommended follow up: Return in about 4 months (around 03/06/2023) for followup or sooner if needed.Schedule b4 you leave.  Can go to 6 months if a1c under 7

## 2022-11-04 NOTE — Progress Notes (Signed)
Phone: [phone]   Subjective:  Patient presents today for their annual physical. Chief complaint-noted.   See problem oriented charting- ROS- full  review of systems was completed and negative  Per full ROS sheet completed by patient other than caregiver stress  The following were reviewed and entered/updated in epic: Past Medical History:  Diagnosis Date   COLONIC POLYPS, HX OF 12/09/2006   ERECTILE DYSFUNCTION, MILD 08/02/2007   GERD 12/09/2006   HYPERLIPIDEMIA 12/09/2006   HYPERTENSION 12/09/2006   HYPOGONADISM 01/08/2010   PLANTAR FASCIITIS 01/21/2010   RADIAL NERVE INJURY 01/21/2010   Patient Active Problem List   Diagnosis Date Noted   Diabetes mellitus (HCC) 12/27/2014    Priority: High   Drug-induced myopathy 12/05/2019    Priority: Medium    Obesity 04/17/2014    Priority: Medium    Major depression, recurrent, full remission (HCC) 04/22/2010    Priority: Medium    Hypogonadism in male 01/08/2010    Priority: Medium    ERECTILE DYSFUNCTION, MILD 08/02/2007    Priority: Medium    Hyperlipidemia associated with type 2 diabetes mellitus (HCC) 12/09/2006    Priority: Medium    Essential hypertension 12/09/2006    Priority: Medium    Seborrheic dermatitis 04/17/2014    Priority: Low   PLANTAR FASCIITIS 01/21/2010    Priority: Low   RADIAL NERVE INJURY 01/21/2010    Priority: Low   Allergic rhinitis 12/09/2006    Priority: Low   GERD 12/09/2006    Priority: Low   History of colonic polyps 12/09/2006    Priority: Low   Colon cancer screening 12/27/2021   Diverticulosis of colon 12/27/2021   RUQ abdominal pain 05/14/2015   Tendinopathy of right rotator cuff 12/27/2014   Past Surgical History:  Procedure Laterality Date   COLONOSCOPY  2008, 2011    Family History  Problem Relation Age of Onset   Breast cancer Mother    Pancreatic cancer Maternal Grandmother    Diabetes Father    Hypertension Father        mother   Hyperlipidemia Father    Lung  cancer Maternal Grandfather        smoker    Medications- reviewed and updated Current Outpatient Medications  Medication Sig Dispense Refill   anastrozole (ARIMIDEX) 1 MG tablet Take 1 tablet by mouth. Every other week  0   DULoxetine (CYMBALTA) 60 MG capsule TAKE ONE CAPSULE BY MOUTH EVERY DAY 90 capsule 1   esomeprazole (NEXIUM) 20 MG capsule Take 40 mg by mouth daily at 12 noon. OTC     fluticasone (FLONASE) 50 MCG/ACT nasal spray PLACE 2 SPRAYS INTO THE NOSE DAILY. 16 g 2   JARDIANCE 25 MG TABS tablet TAKE ONE TABLET DAILY BEFORE BREAKFAST 90 tablet 3   lisinopril-hydrochlorothiazide (ZESTORETIC) 20-25 MG tablet TAKE ONE TABLET DAILY 90 tablet 3   loratadine (CLARITIN) 10 MG tablet Take 1 tablet (10 mg total) by mouth daily. 30 tablet 11   Multiple Vitamin (MULTIVITAMIN) tablet Take 1 tablet by mouth daily.     OZEMPIC, 0.25 OR 0.5 MG/DOSE, 2 MG/3ML SOPN INJECT 0.25mg  AS DIRECTED ONCE a WEEK FOR 28 DAYS, THEN 0.5mg  ONCE a WEEK FOR 28 DAYS 3 mL 3   tretinoin (RETIN-A) 0.1 % cream SMARTSIG:Topical Every Evening     No current facility-administered medications for this visit.    Allergies-reviewed and updated No Known Allergies  Social History   Social History Narrative   Lives alone with mom living at him at  times.       Work as a Firefighter: working on house, garden, shop   Objective  Objective:  BP 134/86   Pulse 73   Temp 98.7 F (37.1 C)   Ht 6\' 2"  (1.88 m)   Wt 241 lb (109.3 kg)   SpO2 94%   BMI 30.94 kg/m  Gen: NAD, resting comfortably HEENT: Mucous membranes are moist. Oropharynx normal Neck: no thyromegaly CV: RRR no murmurs rubs or gallops Lungs: CTAB no crackles, wheeze, rhonchi Abdomen: soft/nontender/nondistended/normal bowel sounds. No rebound or guarding.  Ext: no edema Skin: warm, dry Neuro: grossly normal, moves all extremities, PERRLA    Diabetic Foot Exam - Simple   Simple Foot Form Diabetic Foot exam was performed with the  following findings: Yes 11/04/2022  1:15 PM  Visual Inspection No deformities, no ulcerations, no other skin breakdown bilaterally: Yes Sensation Testing Intact to touch and monofilament testing bilaterally: Yes Pulse Check Posterior Tibialis and Dorsalis pulse intact bilaterally: Yes Comments      Assessment and Plan  62 y.o. male presenting for annual physical.  Health Maintenance counseling: 1. Anticipatory guidance: Patient counseled regarding regular dental exams -q6 months, eye exams -yearly,  avoiding smoking and second hand smoke , limiting alcohol to 2 beverages per day -3-4 a week, no illicit drugs .   2. Risk factor reduction:  Advised patient of need for regular exercise and diet rich and fruits and vegetables to reduce risk of heart attack and stroke.  Exercise- hasn't felt had time with working and caring for mom- long term goal 150 minutes a week.  Diet/weight management-from last physical weight up 6 pounds despite starting Ozempic. Stress can drive appetite  Wt Readings from Last 3 Encounters:  11/04/22 241 lb (109.3 kg)  09/10/22 238 lb (108 kg)  01/06/22 246 lb 3.2 oz (111.7 kg)  3. Immunizations/screenings/ancillary studies-discussed fall flu and COVID shot, team requesting eye records Immunization History  Administered Date(s) Administered   Influenza Split 01/15/2012   Influenza Whole 04/19/2007, 12/25/2008, 01/21/2010   Influenza,inj,Quad PF,6+ Mos 12/06/2012, 04/17/2014, 12/27/2014, 01/07/2016, 12/15/2016, 01/04/2018, 12/06/2018, 12/05/2019, 12/03/2020, 01/06/2022   Influenza-Unspecified 01/15/2012   PFIZER(Purple Top)SARS-COV-2 Vaccination 05/27/2019, 06/17/2019, 12/21/2019   PNEUMOCOCCAL CONJUGATE-20 07/08/2021   Pfizer Covid-19 Vaccine Bivalent Booster 51yrs & up 09/13/2021   Td 09/14/2005   Tdap 09/24/2016   Zoster Recombinant(Shingrix) 09/24/2016, 11/28/2016  4. Prostate cancer screening- low risk prior PSA trend-update with labs today Lab Results   Component Value Date   PSA 0.60 07/08/2021   PSA 0.53 12/03/2020   PSA 0.65 12/05/2019   5. Colon cancer screening - 08/26/21 with 5 year follow up due to polyp history with [facility] 6. Skin cancer screening-no dermatologist. advised regular sunscreen use. Denies worrisome, changing, or new skin lesions.  7. Smoking associated screening (lung cancer screening, AAA screen 65-75, UA)-never smoker 8. STD screening - opts out- not dating at the moment - never had unprotected sex then not been tested for sexual transmitted infection  - would be interested in PREP in future if becomes active- encouraged protection  Status of chronic or acute concerns   # Social update-ongoing caregiver burden caring for her mother.  He takes Cymbalta 60 mg to help manage stress.  We discussed seeing a therapist last year but he is not doing so- he is more interested now- wil refer . Plus lost a good friend to pancreatic cancer in recent months  # Diabetes S:  Medication: Jardiance 10 mg, Ozempic 0.5 mg weekly  -Stopped metformin due to GI issues Lab Results  Component Value Date   HGBA1C 7.4 (H) 01/06/2022   HGBA1C 7.2 (H) 07/08/2021   HGBA1C 7.3 (H) 12/03/2020   A/P: hopefully stable- update a1c today. Continue current meds for now- if a1c over 7 increase Ozempic to 1 mg    # Hypotestosteronism-on Arimidex and testosterone pellets treatment through [facility]-we will monitor PSA  #hypertension S: medication: Lisinopril/hydrochlorothiazide 20-25 mg BP Readings from Last 3 Encounters:  11/04/22 134/86  09/10/22 124/76  01/06/22 128/80  A/P: stable- continue current medicines    #GERD-Nexium working well   # Allergies-Flonase helpful in addition to [employer]   # Depression-remains in partial remissionon Cymbalta 60 mg-situational stress with mom contributing.  No thoughts of self-harm. Refer too therapy     11/04/2022   12:59 PM 01/06/2022   10:22 AM 07/08/2021   10:08 AM  Depression  screen PHQ 2/9  Decreased Interest 1 0 0  Down, Depressed, Hopeless 1 1 0  PHQ - 2 Score 2 1 0  Altered sleeping 0 3 0  Tired, decreased energy 0 3 0  Change in appetite 3 3 0  Feeling bad or failure about yourself  0 0 0  Trouble concentrating 2 3 1   Moving slowly or fidgety/restless 0 0 0  Suicidal thoughts 0 0 0  PHQ-9 Score 7 13 1   Difficult doing work/chores Somewhat difficult Somewhat difficult Not difficult at all   # Hyperlipidemia/myalgia on statin-severe joint pain even on once a week Crestor or atorvastatin.  Also failed Vytorin in the past.  Continue to work on healthy eating/regular exercise and update lipids  # Umbilical hernia-reducible and declined surgery consult in past and again today. No pain.   Recommended follow up: Return in about 4 months (around 03/06/2023) for followup or sooner if needed.Schedule b4 you leave.  Lab/Order associations: fasting   ICD-10-CM   1. Preventative health care  Z00.00     2. Type 2 diabetes mellitus without complication, without long-term current use of insulin (HCC)  E11.9 Hemoglobin A1c    Comprehensive metabolic panel    CBC with Differential/Platelet    Lipid panel    Microalbumin / creatinine urine ratio    3. Essential hypertension  I10     4. Hyperlipidemia associated with type 2 diabetes mellitus (HCC)  E11.69    E78.5     5. Drug-induced myopathy  G72.0     6. Recurrent major depression in partial remission (HCC)  F33.41 Ambulatory referral to Psychology    7. Screening for prostate cancer  Z12.5 PSA    8. Caregiver burden  Z63.6 Ambulatory referral to Psychology      No orders of the defined types were placed in this encounter.   Return precautions advised.  Tana Conch, MD

## 2022-12-30 ENCOUNTER — Other Ambulatory Visit (HOSPITAL_COMMUNITY): Payer: Self-pay

## 2022-12-30 ENCOUNTER — Telehealth: Payer: Self-pay

## 2022-12-30 NOTE — Telephone Encounter (Addendum)
Pharmacy Patient Advocate Encounter   Received notification from CoverMyMeds that prior authorization for Ozempic is required/requested.   Insurance verification completed.   The patient is insured through [facility] .   Per test claim: PA NOT required; PA submitted to [employer] via CoverMyMeds Key/confirmation #/EOC (Key: [id number])      Cost is 886.73, pt has a deductible to meet

## 2023-01-05 ENCOUNTER — Other Ambulatory Visit (HOSPITAL_COMMUNITY): Payer: Self-pay

## 2023-01-12 ENCOUNTER — Other Ambulatory Visit (HOSPITAL_COMMUNITY): Payer: Self-pay

## 2023-01-16 ENCOUNTER — Encounter: Payer: Self-pay | Admitting: Family Medicine

## 2023-01-16 ENCOUNTER — Ambulatory Visit (INDEPENDENT_AMBULATORY_CARE_PROVIDER_SITE_OTHER): Payer: 59 | Admitting: Family Medicine

## 2023-01-16 VITALS — BP 156/60 | HR 75 | Temp 97.8°F | Ht 74.0 in | Wt 244.6 lb

## 2023-01-16 DIAGNOSIS — I10 Essential (primary) hypertension: Secondary | ICD-10-CM

## 2023-01-16 DIAGNOSIS — J018 Other acute sinusitis: Secondary | ICD-10-CM | POA: Diagnosis not present

## 2023-01-16 MED ORDER — PREDNISONE 20 MG PO TABS: 20.0000 mg | ORAL_TABLET | Freq: Every day | ORAL | 0 refills | Status: DC

## 2023-01-16 MED ORDER — AMOXICILLIN-POT CLAVULANATE 875-125 MG PO TABS: 1.0000 | ORAL_TABLET | Freq: Two times a day (BID) | ORAL | 0 refills | Status: DC

## 2023-01-16 NOTE — Progress Notes (Signed)
   WINFIELD CABA is a 62 y.o. male who presents today for an office visit.  Assessment/Plan:  New/Acute Problems: Sinusitis/laryngitis With right sided middle ear effusion however no signs of acute otitis media on today's visit.  We discussed treatment options.  Will start prednisone burst.  He had Depo-Medrol injection for similar presentation several years ago did very well with this.  He can continue taking over-the-counter medications including Flonase and Mucinex.  Will also send in pocket prescription for Augmentin as he may be in early stages of otitis media and bacterial sinusitis at this point.  Encouraged hydration.  He will let us know if not improving by next week.  Follow-up as needed.  Chronic Problems Addressed Today: Essential Hypertension  Elevated today in setting of acute illness.  Typically well-controlled.  He will continue to monitor at home and let us know if persistently elevated.  He will continue lisinopril-HCTZ 20-25 once daily.    Subjective:  HPI:  Patient here with sinus congestion. Started about 5 days ago. Associated symptoms include right ear pain, sore throat, hoarse voice, and malaise. His has had thick yellow mucus production. No fevers or chills. He has tried OTC medications without much improvement. Symptoms are getting worse.  Symptoms feel similar to sinus infection a year ago.       Objective:  Physical Exam: BP (!) 156/60   Pulse 75   Temp 97.8 F (36.6 C)   Ht 6\' 2"  (1.88 m)   Wt 244 lb 9.6 oz (110.9 kg)   SpO2 99%   BMI 31.40 kg/m   Gen: No acute distress, resting comfortably HEENT: Right TM slightly erythematous with effusion.  Left TM clear OP erythematous.  Nose mucosa erythematous and boggy bilaterally. CV: Regular rate and rhythm with no murmurs appreciated Pulm: Normal work of breathing, clear to auscultation bilaterally with no crackles, wheezes, or rhonchi Neuro: Grossly normal, moves all extremities Psych: Normal affect and  thought content      Gracie Gupta M. Jimmey Ralph, MD 01/16/2023 2:37 PM

## 2023-01-16 NOTE — Patient Instructions (Addendum)
It was very nice to see you today!  I will send in an antibiotic and prednisone.  Let us know if not proving by next week.  Return if symptoms worsen or fail to improve.   Take care, Dr Jimmey Ralph  PLEASE NOTE:  If you had any lab tests, please let us know if you have not heard back within a few days. You may see your results on mychart before we have a chance to review them but we will give you a call once they are reviewed by [place].   If we ordered any referrals today, please let us know if you have not heard from their office within the next week.   If you had any urgent prescriptions sent in today, please check with the pharmacy within an hour of our visit to make sure the prescription was transmitted appropriately.   Please try these tips to maintain a healthy lifestyle:  Eat at least 3 REAL meals and 1-2 snacks per day.  Aim for no more than 5 hours between eating.  If you eat breakfast, please do so within one hour of getting up.   Each meal should contain half fruits/vegetables, one quarter protein, and one quarter carbs (no bigger than a computer mouse)  Cut down on sweet beverages. This includes juice, soda, and sweet tea.   Drink at least 1 glass of water with each meal and aim for at least 8 glasses per day  Exercise at least 150 minutes every week.

## 2023-02-27 ENCOUNTER — Other Ambulatory Visit: Payer: Self-pay | Admitting: Internal Medicine

## 2023-02-27 DIAGNOSIS — R052 Subacute cough: Secondary | ICD-10-CM

## 2023-02-28 ENCOUNTER — Encounter: Payer: Self-pay | Admitting: Family Medicine

## 2023-03-02 NOTE — Telephone Encounter (Signed)
Called pt and offered an OV, but pt declined due to symptoms improving. States he will call back if needed.

## 2023-03-12 ENCOUNTER — Encounter (HOSPITAL_BASED_OUTPATIENT_CLINIC_OR_DEPARTMENT_OTHER): Payer: Self-pay | Admitting: Emergency Medicine

## 2023-03-12 ENCOUNTER — Other Ambulatory Visit: Payer: Self-pay

## 2023-03-12 DIAGNOSIS — M7989 Other specified soft tissue disorders: Secondary | ICD-10-CM | POA: Insufficient documentation

## 2023-03-12 DIAGNOSIS — M79672 Pain in left foot: Secondary | ICD-10-CM | POA: Insufficient documentation

## 2023-03-12 DIAGNOSIS — Z5321 Procedure and treatment not carried out due to patient leaving prior to being seen by health care provider: Secondary | ICD-10-CM | POA: Diagnosis not present

## 2023-03-12 NOTE — ED Triage Notes (Signed)
Left foot red, swelling and painful

## 2023-03-13 ENCOUNTER — Emergency Department (HOSPITAL_BASED_OUTPATIENT_CLINIC_OR_DEPARTMENT_OTHER)
Admission: EM | Admit: 2023-03-13 | Discharge: 2023-03-13 | Discharge status: Against Medical Advice | Payer: 59 | Attending: Emergency Medicine | Admitting: Emergency Medicine

## 2023-03-13 NOTE — ED Notes (Signed)
Stickers handed to registrar--pt leaving due to wait time

## 2023-03-23 ENCOUNTER — Encounter: Payer: Self-pay | Admitting: Family Medicine

## 2023-03-23 ENCOUNTER — Ambulatory Visit (INDEPENDENT_AMBULATORY_CARE_PROVIDER_SITE_OTHER): Payer: BC Managed Care – PPO | Admitting: Family Medicine

## 2023-03-23 VITALS — BP 130/78 | HR 78 | Temp 97.4°F | Ht 74.0 in | Wt 243.6 lb

## 2023-03-23 DIAGNOSIS — I10 Essential (primary) hypertension: Secondary | ICD-10-CM | POA: Diagnosis not present

## 2023-03-23 DIAGNOSIS — Z23 Encounter for immunization: Secondary | ICD-10-CM

## 2023-03-23 DIAGNOSIS — E1169 Type 2 diabetes mellitus with other specified complication: Secondary | ICD-10-CM

## 2023-03-23 DIAGNOSIS — Z7984 Long term (current) use of oral hypoglycemic drugs: Secondary | ICD-10-CM

## 2023-03-23 DIAGNOSIS — G720 Drug-induced myopathy: Secondary | ICD-10-CM

## 2023-03-23 DIAGNOSIS — Z7985 Long-term (current) use of injectable non-insulin antidiabetic drugs: Secondary | ICD-10-CM

## 2023-03-23 DIAGNOSIS — E785 Hyperlipidemia, unspecified: Secondary | ICD-10-CM

## 2023-03-23 DIAGNOSIS — E119 Type 2 diabetes mellitus without complications: Secondary | ICD-10-CM

## 2023-03-23 LAB — COMPREHENSIVE METABOLIC PANEL WITH GFR
ALT: 35 U/L (ref 0–53)
AST: 25 U/L (ref 0–37)
Albumin: 4.3 g/dL (ref 3.5–5.2)
Alkaline Phosphatase: 91 U/L (ref 39–117)
BUN: 13 mg/dL (ref 6–23)
CO2: 33 meq/L — ABNORMAL HIGH (ref 19–32)
Calcium: 9.8 mg/dL (ref 8.4–10.5)
Chloride: 96 meq/L (ref 96–112)
Creatinine, Ser: 0.88 mg/dL (ref 0.40–1.50)
GFR: 93.24 mL/min (ref >60.00)
Glucose, Bld: 206 mg/dL — ABNORMAL HIGH (ref 70–99)
Potassium: 4 meq/L (ref 3.5–5.1)
Sodium: 137 meq/L (ref 135–145)
Total Bilirubin: 0.4 mg/dL (ref 0.2–1.2)
Total Protein: 7.2 g/dL (ref 6.0–8.3)

## 2023-03-23 LAB — HEMOGLOBIN A1C: Hgb A1c MFr Bld: 7.8 % — ABNORMAL HIGH (ref 4.6–6.5)

## 2023-03-23 NOTE — Progress Notes (Signed)
 Phone [phone] In person visit   Subjective:   Jason Lopez is a 63 y.o. year old very pleasant male patient who presents for/with See problem oriented charting Chief Complaint  Patient presents with   Medical Management of Chronic Issues   Diabetes   Hypertension                                                                                                      Past Medical History-  Patient Active Problem List   Diagnosis Date Noted   Diabetes mellitus (HCC) 12/27/2014    Priority: High   Drug-induced myopathy 12/05/2019    Priority: Medium    Obesity 04/17/2014    Priority: Medium    Major depression, recurrent, full remission (HCC) 04/22/2010    Priority: Medium    Hypogonadism in male 01/08/2010    Priority: Medium    ERECTILE DYSFUNCTION, MILD 08/02/2007    Priority: Medium    Hyperlipidemia associated with type 2 diabetes mellitus (HCC) 12/09/2006    Priority: Medium    Essential hypertension 12/09/2006    Priority: Medium    Seborrheic dermatitis 04/17/2014    Priority: Low   PLANTAR FASCIITIS 01/21/2010    Priority: Low   RADIAL NERVE INJURY 01/21/2010    Priority: Low   Allergic rhinitis 12/09/2006    Priority: Low   GERD 12/09/2006    Priority: Low   History of colonic polyps 12/09/2006    Priority: Low   Colon cancer screening 12/27/2021   Diverticulosis of colon 12/27/2021   RUQ abdominal pain 05/14/2015   Tendinopathy of right rotator cuff 12/27/2014    Medications- reviewed and updated Current Outpatient Medications  Medication Sig Dispense Refill   anastrozole (ARIMIDEX) 1 MG tablet Take 1 tablet by mouth. Every other week  0   DULoxetine  (CYMBALTA ) 60 MG capsule TAKE ONE CAPSULE BY MOUTH EVERY DAY 90 capsule 1   esomeprazole (NEXIUM) 20 MG capsule Take 40 mg by mouth daily at 12 noon. OTC     fluticasone  (FLONASE ) 50 MCG/ACT nasal spray PLACE 2 SPRAYS INTO THE NOSE DAILY. 16 g 2   JARDIANCE  25 MG TABS tablet TAKE ONE TABLET DAILY  BEFORE BREAKFAST 90 tablet 3   lisinopril -hydrochlorothiazide  (ZESTORETIC ) 20-25 MG tablet TAKE ONE TABLET DAILY 90 tablet 3   loratadine  (CLARITIN ) 10 MG tablet TAKE ONE TABLET BY MOUTH ONCE DAILY 30 tablet 11   Multiple Vitamin (MULTIVITAMIN) tablet Take 1 tablet by mouth daily.     tretinoin (RETIN-A) 0.1 % cream SMARTSIG:Topical Every Evening     OZEMPIC , 0.25 OR 0.5 MG/DOSE, 2 MG/3ML SOPN INJECT 0.25mg  AS DIRECTED ONCE a WEEK FOR 28 DAYS, THEN 0.5mg  ONCE a WEEK FOR 28 DAYS (Patient not taking: Reported on 03/23/2023) 3 mL 3   No current facility-administered medications for this visit.     Objective:  BP 130/78   Pulse 78   Temp (!) 97.4 F (36.3 C)   Ht 6' 2 (1.88 m)   Wt 243 lb 9.6 oz (110.5 kg)   SpO2 97%   BMI 31.28  kg/m  Gen: NAD, resting comfortably CV: RRR no murmurs rubs or gallops Lungs: CTAB no crackles, wheeze, rhonchi Ext: no edema Skin: warm, dry    Assessment and Plan   # Social update-loss mother Rodell December 2024- happened quickly- was very hard on him. They were very close   # Diabetes S: Medication:Jardiance  10 mg, Ozempic  0.5 mg weekly- until expense went up in September/october -GI distress on metformin   Lab Results  Component Value Date   HGBA1C 6.7 (H) 11/04/2022   HGBA1C 7.4 (H) 01/06/2022   HGBA1C 7.2 (H) 07/08/2021  A/P: hopefully stable off of ozempic - update a1c today. Continue current meds for now - likely refill Ozempic  and restart at lower dose then titrate up- especially if wants to use for weight loss  #hypertension S: medication: Lisinopril  hydrochlorothiazide  20-25 mg BP Readings from Last 3 Encounters:  03/23/23 130/78  03/12/23 126/79  01/16/23 (!) 156/60  A/P: stable- continue current medicines   #hyperlipidemia with statin myalgia-severe joint pain even on once a week Crestor or atorvastatin .  Failed Vytorin in the past as well S: Medication:none  Lab Results  Component Value Date   CHOL 292 (H) 11/04/2022   HDL 52.30  11/04/2022   LDLCALC 167 (H) 01/06/2022   LDLDIRECT 231.0 11/04/2022   TRIG (H) 11/04/2022    412.0 Triglyceride is over 400; calculations on Lipids are invalid.   CHOLHDL 6 11/04/2022  A/P: lipids very high- we discussed possible lipid clinic referral but with recent loss we opted to hold off  # Hypogonadism-on Arimidex through [facility] in past- overdue for follow up   # Depression S:Medication: Cymbalta  60 mg. No SI.  - Referral to therapy August 2024- didn't get to do this with how busy he was caring for mom -has had to use some old xanax A/P:  with loss of mother in December- I'm actually pleasantly surprised with control with phq9 of 8 and General Anxiety Disorder - 7 question screening survey of 7 today- partial remission/mild symptoms even with grieving- continue current medications  - plans to add grief counseling  # GERD S:Medication: Nexium   20 mg A/P: stable- continue current medicines    # Allergies-Allegra/Flonase  helpful   # Umbilical hernia-declines surgery consult-no pain again today    Recommended follow up: Return in about 4 months (around 07/21/2023) for followup or sooner if needed.Schedule b4 you leave.  Lab/Order associations:   ICD-10-CM   1. Type 2 diabetes mellitus without complication, without long-term current use of insulin  (HCC)  E11.9     2. Essential hypertension  I10     3. Hyperlipidemia associated with type 2 diabetes mellitus (HCC)  E11.69    E78.5     4. Drug-induced myopathy  G72.0     5. Need for influenza vaccination  Z23 Flu vaccine trivalent PF, 6mos and older(Flulaval,Afluria,Fluarix,Fluzone)     No orders of the defined types were placed in this encounter.  Return precautions advised.  Garnette Lukes, MD

## 2023-03-23 NOTE — Patient Instructions (Addendum)
 Get diabetic eye exam scheduled.   Please stop by lab before you go If you have mychart- we will send your results within 3 business days of us  receiving them.  If you do not have mychart- we will call you about results within 5 business days of us  receiving them.  *please also note that you will see labs on mychart as soon as they post. I will later go in and write notes on them- will say notes from Dr. Katrinka   Let us  know if you get a COVID shot at your pharmacy.   Recommended follow up: Return in about 4 months (around 07/21/2023) for followup or sooner if needed.Schedule b4 you leave.

## 2023-04-06 ENCOUNTER — Other Ambulatory Visit (HOSPITAL_COMMUNITY): Payer: Self-pay

## 2023-04-08 ENCOUNTER — Telehealth: Payer: Self-pay

## 2023-04-08 ENCOUNTER — Other Ambulatory Visit (HOSPITAL_COMMUNITY): Payer: Self-pay

## 2023-04-08 NOTE — Telephone Encounter (Signed)
*  Primary  Pharmacy Patient Advocate Encounter   Received notification from Fax that prior authorization for Ozempic (0.25 or 0.5 MG/DOSE) 2MG /3ML pen-injectors  is required/requested.   Insurance verification completed.   The patient is insured through [facility] .   Per test claim: PA required; PA submitted to above mentioned insurance via CoverMyMeds Key/confirmation #/EOC [id number] Status is pending

## 2023-04-09 DIAGNOSIS — E291 Testicular hypofunction: Secondary | ICD-10-CM | POA: Diagnosis not present

## 2023-04-09 DIAGNOSIS — Z79890 Hormone replacement therapy (postmenopausal): Secondary | ICD-10-CM | POA: Diagnosis not present

## 2023-04-10 ENCOUNTER — Other Ambulatory Visit (HOSPITAL_COMMUNITY): Payer: Self-pay

## 2023-04-10 NOTE — Telephone Encounter (Signed)
Pharmacy Patient Advocate Encounter  Received notification from [facility] that Prior Authorization for Ozempic has been APPROVED from 04/08/2023 to 04/07/2024. Ran test claim, Copay is $977.83. This test claim was processed through [facility]- copay amounts may vary at other pharmacies due to pharmacy/plan contracts, or as the patient moves through the different stages of their insurance plan.

## 2023-04-13 DIAGNOSIS — E291 Testicular hypofunction: Secondary | ICD-10-CM | POA: Diagnosis not present

## 2023-04-13 DIAGNOSIS — R6882 Decreased libido: Secondary | ICD-10-CM | POA: Diagnosis not present

## 2023-04-13 DIAGNOSIS — I10 Essential (primary) hypertension: Secondary | ICD-10-CM | POA: Diagnosis not present

## 2023-04-13 DIAGNOSIS — N529 Male erectile dysfunction, unspecified: Secondary | ICD-10-CM | POA: Diagnosis not present

## 2023-04-20 DIAGNOSIS — H35033 Hypertensive retinopathy, bilateral: Secondary | ICD-10-CM | POA: Diagnosis not present

## 2023-04-20 DIAGNOSIS — E119 Type 2 diabetes mellitus without complications: Secondary | ICD-10-CM | POA: Diagnosis not present

## 2023-04-20 LAB — HM DIABETES EYE EXAM

## 2023-05-05 ENCOUNTER — Other Ambulatory Visit: Payer: Self-pay | Admitting: Family Medicine

## 2023-05-19 ENCOUNTER — Encounter: Payer: Self-pay | Admitting: Family Medicine

## 2023-06-05 DIAGNOSIS — M25561 Pain in right knee: Secondary | ICD-10-CM | POA: Diagnosis not present

## 2023-06-05 DIAGNOSIS — M25562 Pain in left knee: Secondary | ICD-10-CM | POA: Diagnosis not present

## 2023-06-05 DIAGNOSIS — M25511 Pain in right shoulder: Secondary | ICD-10-CM | POA: Diagnosis not present

## 2023-06-15 DIAGNOSIS — M1611 Unilateral primary osteoarthritis, right hip: Secondary | ICD-10-CM | POA: Diagnosis not present

## 2023-07-21 ENCOUNTER — Ambulatory Visit: Payer: BC Managed Care – PPO | Admitting: Family Medicine

## 2023-07-27 ENCOUNTER — Ambulatory Visit: Payer: BC Managed Care – PPO | Admitting: Family Medicine

## 2023-08-31 ENCOUNTER — Ambulatory Visit (INDEPENDENT_AMBULATORY_CARE_PROVIDER_SITE_OTHER): Payer: Self-pay | Admitting: Family Medicine

## 2023-08-31 ENCOUNTER — Encounter: Payer: Self-pay | Admitting: Family Medicine

## 2023-08-31 VITALS — BP 120/70 | HR 80 | Temp 97.5°F | Ht 74.0 in | Wt 246.2 lb

## 2023-08-31 DIAGNOSIS — E119 Type 2 diabetes mellitus without complications: Secondary | ICD-10-CM | POA: Diagnosis not present

## 2023-08-31 DIAGNOSIS — E1169 Type 2 diabetes mellitus with other specified complication: Secondary | ICD-10-CM | POA: Diagnosis not present

## 2023-08-31 DIAGNOSIS — G720 Drug-induced myopathy: Secondary | ICD-10-CM | POA: Diagnosis not present

## 2023-08-31 DIAGNOSIS — Z1283 Encounter for screening for malignant neoplasm of skin: Secondary | ICD-10-CM

## 2023-08-31 DIAGNOSIS — I10 Essential (primary) hypertension: Secondary | ICD-10-CM

## 2023-08-31 DIAGNOSIS — E785 Hyperlipidemia, unspecified: Secondary | ICD-10-CM

## 2023-08-31 DIAGNOSIS — Z7984 Long term (current) use of oral hypoglycemic drugs: Secondary | ICD-10-CM

## 2023-08-31 MED ORDER — DULOXETINE HCL 30 MG PO CPEP: 30.0000 mg | ORAL_CAPSULE | Freq: Every day | ORAL | 3 refills | Status: AC

## 2023-08-31 MED ORDER — DULOXETINE HCL 60 MG PO CPEP: 60.0000 mg | ORAL_CAPSULE | Freq: Every day | ORAL | 1 refills | Status: AC

## 2023-08-31 NOTE — Progress Notes (Signed)
 Phone [phone] In person visit   Subjective:   Jason Lopez is a 63 y.o. year old very pleasant male patient who presents for/with See problem oriented charting Chief Complaint  Patient presents with   Medical Management of Chronic Issues   Diabetes   Hypertension    Past Medical History-  Patient Active Problem List   Diagnosis Date Noted   Diabetes mellitus (HCC) 12/27/2014    Priority: High   Drug-induced myopathy 12/05/2019    Priority: Medium    Obesity 04/17/2014    Priority: Medium    Major depression, recurrent, full remission (HCC) 04/22/2010    Priority: Medium    Hypogonadism in male 01/08/2010    Priority: Medium    ERECTILE DYSFUNCTION, MILD 08/02/2007    Priority: Medium    Hyperlipidemia associated with type 2 diabetes mellitus (HCC) 12/09/2006    Priority: Medium    Essential hypertension 12/09/2006    Priority: Medium    Seborrheic dermatitis 04/17/2014    Priority: Low   PLANTAR FASCIITIS 01/21/2010    Priority: Low   RADIAL NERVE INJURY 01/21/2010    Priority: Low   Allergic rhinitis 12/09/2006    Priority: Low   GERD 12/09/2006    Priority: Low   History of colonic polyps 12/09/2006    Priority: Low   Colon cancer screening 12/27/2021   Diverticulosis of colon 12/27/2021   RUQ abdominal pain 05/14/2015   Tendinopathy of right rotator cuff 12/27/2014    Medications- reviewed and updated Current Outpatient Medications  Medication Sig Dispense Refill   anastrozole (ARIMIDEX) 1 MG tablet Take 1 tablet by mouth. Every other week  0   DULoxetine  (CYMBALTA ) 60 MG capsule TAKE ONE CAPSULE BY MOUTH EVERY DAY 90 capsule 1   esomeprazole (NEXIUM) 20 MG capsule Take 40 mg by mouth daily at 12 noon. OTC     fluticasone  (FLONASE ) 50 MCG/ACT nasal spray PLACE 2 SPRAYS INTO THE NOSE DAILY. 16 g 2   lisinopril -hydrochlorothiazide  (ZESTORETIC ) 20-25 MG tablet TAKE ONE TABLET DAILY 90 tablet 3   loratadine  (CLARITIN ) 10 MG tablet TAKE ONE TABLET BY  MOUTH ONCE DAILY 30 tablet 11   Multiple Vitamin (MULTIVITAMIN) tablet Take 1 tablet by mouth daily.     tretinoin (RETIN-A) 0.1 % cream SMARTSIG:Topical Every Evening     JARDIANCE  25 MG TABS tablet TAKE ONE TABLET DAILY BEFORE BREAKFAST (Patient not taking: Reported on 08/31/2023) 90 tablet 3   OZEMPIC , 0.25 OR 0.5 MG/DOSE, 2 MG/3ML SOPN INJECT 0.25mg  AS DIRECTED ONCE a WEEK FOR 28 DAYS, THEN 0.5mg  ONCE a WEEK FOR 28 DAYS (Patient not taking: Reported on 08/31/2023) 3 mL 3   No current facility-administered medications for this visit.     Objective:  BP 120/70   Pulse 80   Temp (!) 97.5 F (36.4 C)   Ht 6' 2 (1.88 m)   Wt 246 lb 3.2 oz (111.7 kg)   SpO2 95%   BMI 31.61 kg/m  Gen: NAD, resting comfortably, appropriately tearful at times CV: RRR no murmurs rubs or gallops Lungs: CTAB no crackles, wheeze, rhonchi Ext: no edema Skin: warm, dry     Assessment and Plan   # Social update-loss mother Brien Can March 11 2023   #dermatology check- has been sometime- has one spot just over 6mm on left shin I recommended he schedule new follow up - has seen [facility] in the past  # Depression S:Medication: Cymbalta  60 mg- started 2012- did not do well off of  it in pst - Referral to therapy August 2024- is doing this still - finds himself very edgy. Grieving has been really hard - less inspired to go out. Feels content at home.  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 Doesn't feel as much direction in purpose.  -states no SI but less desire to live A/P:  phq9 at 19 today but a lot of this is grieving related.    # Diabetes S: Medication:NONE -Jardiance  250 mg- he is off of this- $400 on new drug plan - in past Ozempic  0.5 mg weekly but new drug plan too costly- $1200 -gastrointestinal distress on metformin  -weight up some -not interested in [place] Lab Results  Component Value Date   HGBA1C 7.8 (H) 03/23/2023    HGBA1C 6.7 (H) 11/04/2022   HGBA1C 7.4 (H) 01/06/2022  CBGs- not checking    A/P: concern a1c could be higher- check today- may be able to short term use something like glipizide and or actos to bring things down- needs very cost effective solution and doesn't tolerate metformin   #hypertension S: medication: Lisinopril  hydrochlorothiazide  20-25 mg BP Readings from Last 3 Encounters:  08/31/23 120/70  03/23/23 130/78  03/12/23 126/79  A/P: well controlled continue current medications   #hyperlipidemia with statin myalgia-severe joint pain even on once a week Crestor or atorvastatin .  Failed Vytorin in the past as well S: Medication: none  Lab Results  Component Value Date   CHOL 292 (H) 11/04/2022   HDL 52.30 11/04/2022   LDLCALC 167 (H) 01/06/2022   LDLDIRECT 231.0 11/04/2022   TRIG (H) 11/04/2022    412.0 Triglyceride is over 400; calculations on Lipids are invalid.   CHOLHDL 6 11/04/2022  A/P: lipids above goal but has statin myalgia- have to focus on lifestyle but first priority is trying to help mental health which may improve motivation   # Hypogonadism-on Arimidexthrough [facility]   Recommended follow up: Return in about 4 months (around 12/31/2023) for physical or sooner if needed.Schedule b4 you leave. - also schedule 6 week follow up to see how you are doing with Cymbalta  increase  Lab/Order associations:   ICD-10-CM   1. Type 2 diabetes mellitus without complication, without long-term current use of insulin  (HCC)  E11.9     2. Essential hypertension  I10     3. Hyperlipidemia associated with type 2 diabetes mellitus (HCC)  E11.69    E78.5     4. Drug-induced myopathy  G72.0       No orders of the defined types were placed in this encounter.   Return precautions advised.  Clarisa Crooked, MD

## 2023-08-31 NOTE — Patient Instructions (Addendum)
 Please stop by lab before you go If you have mychart- we will send your results within 3 business days of us  receiving them.  If you do not have mychart- we will call you about results within 5 business days of us  receiving them.  *please also note that you will see labs on mychart as soon as they post. I will later go in and write notes on them- will say notes from Dr. Arlene Ben   Taking the medicine as directed and not missing any doses is one of the best things you can do to treat your depression.  Here are some things to keep in mind:  Side effects (stomach upset, some increased anxiety) may happen before you notice a benefit.  These side effects typically go away over time. Changes to your dose of medicine or a change in medication all together is sometimes necessary Most people need to be on medication on increased dose at least 6-12 months Many people will notice an improvement within two weeks but the full effect of the medication can take up to 6 weeks Stopping the medication when you start feeling better often results in a return of symptoms If you start having thoughts of hurting yourself or others after starting this medicine, call our office immediately at [phone] or seek care through 911.    We have placed a referral for you today to Dr. Myrtie Atkinson- please call their # if you do not hear within a week (may be listed below or you may see mychart message within a few days with #).    Recommended follow up: Return in about 4 months (around 12/31/2023) for physical or sooner if needed.Schedule b4 you leave. - also schedule 6 week follow up to see how you are doing with Cymbalta  increase

## 2023-09-01 ENCOUNTER — Ambulatory Visit: Payer: Self-pay | Admitting: Family Medicine

## 2023-09-01 DIAGNOSIS — E119 Type 2 diabetes mellitus without complications: Secondary | ICD-10-CM

## 2023-09-01 LAB — COMPREHENSIVE METABOLIC PANEL WITH GFR
ALT: 34 U/L (ref 0–53)
AST: 24 U/L (ref 0–37)
Albumin: 4.4 g/dL (ref 3.5–5.2)
Alkaline Phosphatase: 74 U/L (ref 39–117)
BUN: 15 mg/dL (ref 6–23)
CO2: 28 meq/L (ref 19–32)
Calcium: 9.7 mg/dL (ref 8.4–10.5)
Chloride: 95 meq/L — ABNORMAL LOW (ref 96–112)
Creatinine, Ser: 0.91 mg/dL (ref 0.40–1.50)
GFR: 91.1 mL/min (ref >60.00)
Glucose, Bld: 337 mg/dL — ABNORMAL HIGH (ref 70–99)
Potassium: 3.8 meq/L (ref 3.5–5.1)
Sodium: 134 meq/L — ABNORMAL LOW (ref 135–145)
Total Bilirubin: 0.5 mg/dL (ref 0.2–1.2)
Total Protein: 7.4 g/dL (ref 6.0–8.3)

## 2023-09-01 LAB — MICROALBUMIN / CREATININE URINE RATIO
Creatinine,U: 53.1 mg/dL
Microalb Creat Ratio: 42.4 mg/g — ABNORMAL HIGH (ref 0.0–30.0)
Microalb, Ur: 2.3 mg/dL — ABNORMAL HIGH (ref 0.0–1.9)

## 2023-09-01 LAB — HEMOGLOBIN A1C: Hgb A1c MFr Bld: 10.1 % — ABNORMAL HIGH (ref 4.6–6.5)

## 2023-09-03 ENCOUNTER — Other Ambulatory Visit (HOSPITAL_COMMUNITY): Payer: Self-pay

## 2023-09-10 ENCOUNTER — Telehealth: Payer: Self-pay | Admitting: Pharmacist

## 2023-09-10 NOTE — Progress Notes (Signed)
 Reviewed patient's plan regarding cost of diabetes medication. Also check cost on Good Rx Discussed with patient. Forwarding information in a MyChart message.   Biguanides (metformin ) Cost of metformin  - usually $0 to 20 per month. How it works: Metformin  lowers your blood sugar by reducing the amount of sugar your liver releases throughout the day. It also tends to reduce your appetite and reduces the amount of sugar your digestive system absorbs from the food you eat. Side effects include: Digestive upset, gas, diarrhea Need-to-know: There are several things you can do to reduce the side effects of metformin . This includes always taking it with food, starting on a low dose and gradually increasing to the intended dosage and switching to the extended-release version. The side effects are also expected to calm down after the first month or so.  DPP-4 inhibitors -  Januvia / Zituvio (sitagliptin), Nesina (alogliptin), Onglyza (saxagliptin), Tradjenta (linagliptin) Cost: usually about $400 to 500 and often has a discount card to lower cost about $150 There is a generic for Onglyza / saxagliptin - cost on GoodRx card is about $150 Also Zituvio / sitagliptin cost on [facility] Rx card is about $120 at [facility] How it works: DPP-4 Inhibitors reduce blood sugar levels by increasing the amount of insulin  your pancreas produces, but only when your blood sugar is already high. They also reduce the amount of sugar your liver releases throughout the day.  How it's taken: Taken orally as a pill, once or several times per day., with or without food. Side effects include:  diarrhea or joint pain (but side effects are rare)    SGLT2 inhibitors Farxiga (dapagliflozin), Invokana (canagliflozin), Jardiance  (empagliflozin ) Cost: generic Doreen / dapagliflozin with Good Rx card = $288 but not on your current insurance formulary Farxiga with your current plan would be about $580 but with manufacturer coupon that would lover  $175 - cost = $605 until you meet deductible; about $175 after deductible How it works: These drugs work by preventing your kidneys from absorbing as much glucose, and then passing that excess glucose through your urine so it never enters your bloodstream. Side effects include: yeast infections, urinary tract infections, increased need to urinate, mild weight-loss  GLP-1 agonists or Dual GIP / GLP-1 Agonists Cost: Most cost about $1000 per month. Manfacturer offers coupon card that lowers cost by about $150 to $175. After you deductible cost would be about $450 per month. There are programs where you could pay cash - Agricultural consultant for [employer] and [employer] for [employer] names: Trulicity (dulaglutide), Ozempic  / Tzhncb (semaglutide ), Rybelsus (semaglutide ), Mounjaro / Zepbound (tirzepatide) How it works: These drugs work by increasing insulin  production when blood sugar levels start rising and slowing down the speed at which your stomach empties glucose into your bloodstream. Side effects include: nausea, vomiting, diarrhea, reduced appetite, weight-loss  Meglitinides Cost: about $25 to 50 per month Brand names: Prandin (repaglinide), Starlix (nateglinide) How it works: These drugs work by increasing the amount of insulin  you produce but only for a few hours, which is ideal for managing your blood sugar after+ eating. How it's taken: Orally, as a pill, right before eating. You should never take this medication unless you are about to eat a meal or have already begun eating. Possible side effects: Weight gain, diarrhea, nausea, joint pain, back pain, headache, stuffy nose, sore throat  Sulfonylureas Cost: about $10 to 25 per month Brand names: Amaryl (glimepiride), Diabeta (glyburide), Diabinese (chlorpropamide), Glucotrol (glipizide), Glucotrol Extended-Release (glipizide XR), Glynase (  glyburide), and Micronase (glyburide). How it works: Sulfonylureas work primarily by increasing the amount  of insulin  your pancreas produces. However, unlike newer drugs that only increase insulin  production when blood sugars are rising, there's a higher risk of low blood sugars with sulfonylureas because your body is producing more insulin  around the clock. Possible side effects: Low blood sugars, weight gain  Thiazolidinediones (TZDs)  Cost: about $10 to 50 per month Brand names: Actos (pioglitazone) How it works: improves insulin  sensitivity so that you can use insulin  more efficiently to lower blood glucose.  Possible side effects: weight gain, retaining fluid / swelling in your legs.

## 2023-09-25 ENCOUNTER — Other Ambulatory Visit: Payer: Self-pay | Admitting: Family Medicine

## 2023-10-03 ENCOUNTER — Other Ambulatory Visit (HOSPITAL_COMMUNITY): Payer: Self-pay

## 2023-10-05 ENCOUNTER — Other Ambulatory Visit (HOSPITAL_COMMUNITY): Payer: Self-pay

## 2024-01-04 ENCOUNTER — Encounter: Payer: Self-pay | Admitting: Dermatology

## 2024-01-04 ENCOUNTER — Ambulatory Visit (INDEPENDENT_AMBULATORY_CARE_PROVIDER_SITE_OTHER): Payer: Self-pay | Admitting: Dermatology

## 2024-01-04 ENCOUNTER — Ambulatory Visit: Admitting: "Endocrinology

## 2024-01-04 VITALS — BP 130/86 | HR 63

## 2024-01-04 DIAGNOSIS — W908XXA Exposure to other nonionizing radiation, initial encounter: Secondary | ICD-10-CM

## 2024-01-04 DIAGNOSIS — D485 Neoplasm of uncertain behavior of skin: Secondary | ICD-10-CM

## 2024-01-04 DIAGNOSIS — L578 Other skin changes due to chronic exposure to nonionizing radiation: Secondary | ICD-10-CM

## 2024-01-04 DIAGNOSIS — L814 Other melanin hyperpigmentation: Secondary | ICD-10-CM

## 2024-01-04 NOTE — Progress Notes (Signed)
   New Patient Visit   Subjective  Jason Lopez is a 63 y.o. male who presents for the following: spot of concern  Pt has spot on his left lower leg for a long time he'd evaluated. Lesion may have changed over time, not previously treated. He has no hx of skin cancer but family hx of NMSC  The following portions of the chart were reviewed this encounter and updated as appropriate: medications, allergies, medical history  Review of Systems:  No other skin or systemic complaints except as noted in HPI or Assessment and Plan.  Objective  Well appearing patient in no apparent distress; mood and affect are within normal limits.  A focused examination was performed of the following areas: Left lower leg  Relevant exam findings are noted in the Assessment and Plan.  Left Lower Leg - Anterior 5mm brown macule   Assessment & Plan   ACTINIC DAMAGE - chronic, secondary to cumulative UV radiation exposure/sun exposure over time - diffuse scaly erythematous macules with underlying dyspigmentation - Recommend daily broad spectrum sunscreen SPF 30+ to sun-exposed areas, reapply every 2 hours as needed.  - Recommend staying in the shade or wearing long sleeves, sun glasses (UVA+UVB protection) and wide brim hats (4-inch brim around the entire circumference of the hat). - Call for new or changing lesions.  NEOPLASM OF UNCERTAIN BEHAVIOR OF SKIN Left Lower Leg - Anterior Skin / nail biopsy Type of biopsy: tangential   Informed consent: discussed and consent obtained   Timeout: patient name, date of birth, surgical site, and procedure verified   Procedure prep:  Patient was prepped and draped in usual sterile fashion Prep type:  Isopropyl alcohol Anesthesia: the lesion was anesthetized in a standard fashion   Anesthetic:  1% lidocaine w/ epinephrine 1-100,000 buffered w/ 8.4% NaHCO3 Instrument used: DermaBlade   Hemostasis achieved with: aluminum chloride   Outcome: patient tolerated  procedure well   Post-procedure details: sterile dressing applied and wound care instructions given   Dressing type: bandage and pressure dressing    Specimen 1 - Surgical pathology Differential Diagnosis: R/O lentigo vs MM vs sk  Check Margins: No  No follow-ups on file.  I, Darice Smock, CMA, am acting as scribe for RUFUS CHRISTELLA HOLY, MD.   Documentation: I have reviewed the above documentation for accuracy and completeness, and I agree with the above.  RUFUS CHRISTELLA HOLY, MD

## 2024-01-04 NOTE — Patient Instructions (Addendum)

## 2024-01-06 LAB — SURGICAL PATHOLOGY

## 2024-01-07 ENCOUNTER — Ambulatory Visit: Payer: Self-pay | Admitting: Dermatology

## 2024-01-11 ENCOUNTER — Encounter: Payer: Self-pay | Admitting: Family Medicine

## 2024-01-11 ENCOUNTER — Encounter: Admitting: Family Medicine

## 2024-01-11 ENCOUNTER — Ambulatory Visit (INDEPENDENT_AMBULATORY_CARE_PROVIDER_SITE_OTHER): Payer: Self-pay | Admitting: Family Medicine

## 2024-01-11 ENCOUNTER — Ambulatory Visit: Admitting: "Endocrinology

## 2024-01-11 VITALS — BP 120/78 | HR 94 | Temp 97.2°F | Ht 74.0 in | Wt 235.4 lb

## 2024-01-11 DIAGNOSIS — E119 Type 2 diabetes mellitus without complications: Secondary | ICD-10-CM

## 2024-01-11 DIAGNOSIS — E785 Hyperlipidemia, unspecified: Secondary | ICD-10-CM

## 2024-01-11 DIAGNOSIS — Z125 Encounter for screening for malignant neoplasm of prostate: Secondary | ICD-10-CM

## 2024-01-11 DIAGNOSIS — E1169 Type 2 diabetes mellitus with other specified complication: Secondary | ICD-10-CM

## 2024-01-11 DIAGNOSIS — Z0000 Encounter for general adult medical examination without abnormal findings: Secondary | ICD-10-CM

## 2024-01-11 DIAGNOSIS — G720 Drug-induced myopathy: Secondary | ICD-10-CM

## 2024-01-11 DIAGNOSIS — Z79899 Other long term (current) drug therapy: Secondary | ICD-10-CM

## 2024-01-11 DIAGNOSIS — Z7984 Long term (current) use of oral hypoglycemic drugs: Secondary | ICD-10-CM

## 2024-01-11 DIAGNOSIS — I10 Essential (primary) hypertension: Secondary | ICD-10-CM

## 2024-01-11 DIAGNOSIS — R052 Subacute cough: Secondary | ICD-10-CM

## 2024-01-11 LAB — POC COVID19 BINAXNOW: SARS Coronavirus 2 Ag: NEGATIVE

## 2024-01-11 LAB — POCT INFLUENZA A/B
Influenza A, POC: NEGATIVE
Influenza B, POC: NEGATIVE

## 2024-01-11 LAB — POCT RAPID STREP A (OFFICE): Rapid Strep A Screen: NEGATIVE

## 2024-01-11 MED ORDER — LANCETS MISC: 1.0000 | 0 refills | Status: AC

## 2024-01-11 MED ORDER — BLOOD GLUCOSE MONITORING SUPPL DEVI: 1.0000 | Freq: Three times a day (TID) | 0 refills | Status: AC

## 2024-01-11 MED ORDER — LANCET DEVICE MISC: 1.0000 | Freq: Three times a day (TID) | 0 refills | Status: AC

## 2024-01-11 MED ORDER — METFORMIN HCL ER 500 MG PO TB24: 500.0000 mg | ORAL_TABLET | Freq: Two times a day (BID) | ORAL | 5 refills | Status: AC

## 2024-01-11 MED ORDER — EZETIMIBE 10 MG PO TABS: 10.0000 mg | ORAL_TABLET | Freq: Every day | ORAL | 3 refills | Status: AC

## 2024-01-11 MED ORDER — BLOOD GLUCOSE TEST VI STRP: 1.0000 | ORAL_STRIP | Freq: Three times a day (TID) | 0 refills | Status: AC

## 2024-01-11 NOTE — Progress Notes (Signed)
 Phone: [phone]   Subjective:  Patient presents today for their annual physical. Chief complaint-noted.   See problem oriented charting- ROS- full  review of systems was completed and negative  except for topics noted under acute/chronic concerns  The following were reviewed and entered/updated in epic: Past Medical History:  Diagnosis Date   COLONIC POLYPS, HX OF 12/09/2006   ERECTILE DYSFUNCTION, MILD 08/02/2007   GERD 12/09/2006   HYPERLIPIDEMIA 12/09/2006   HYPERTENSION 12/09/2006   HYPOGONADISM 01/08/2010   PLANTAR FASCIITIS 01/21/2010   RADIAL NERVE INJURY 01/21/2010   Patient Active Problem List   Diagnosis Date Noted   Diabetes mellitus (HCC) 12/27/2014    Priority: High   Drug-induced myopathy 12/05/2019    Priority: Medium    Obesity 04/17/2014    Priority: Medium    Major depression, recurrent, full remission 04/22/2010    Priority: Medium    Hypogonadism in male 01/08/2010    Priority: Medium    ERECTILE DYSFUNCTION, MILD 08/02/2007    Priority: Medium    Hyperlipidemia associated with type 2 diabetes mellitus (HCC) 12/09/2006    Priority: Medium    Essential hypertension 12/09/2006    Priority: Medium    Seborrheic dermatitis 04/17/2014    Priority: Low   PLANTAR FASCIITIS 01/21/2010    Priority: Low   RADIAL NERVE INJURY 01/21/2010    Priority: Low   Allergic rhinitis 12/09/2006    Priority: Low   GERD 12/09/2006    Priority: Low   History of colonic polyps 12/09/2006    Priority: Low   Colon cancer screening 12/27/2021   Diverticulosis of colon 12/27/2021   RUQ abdominal pain 05/14/2015   Tendinopathy of right rotator cuff 12/27/2014   Past Surgical History:  Procedure Laterality Date   COLONOSCOPY  2008, 2011   TOTAL SHOULDER REPLACEMENT Left     Family History  Problem Relation Age of Onset   Breast cancer Mother    Diabetes Father    Hyperlipidemia Father    Pancreatic cancer Maternal Grandmother    Lung cancer Maternal Grandfather         smoker    Medications- reviewed and updated Current Outpatient Medications  Medication Sig Dispense Refill   Blood Glucose Monitoring Suppl DEVI 1 each by Does not apply route in the morning, at noon, and at bedtime. May substitute to any manufacturer covered by patient's insurance. 1 each 0   DULoxetine  (CYMBALTA ) 30 MG capsule Take 1 capsule (30 mg total) by mouth daily. In addition to 60 mg dose for total of 90 mg 90 capsule 3   DULoxetine  (CYMBALTA ) 60 MG capsule Take 1 capsule (60 mg total) by mouth daily. 90 capsule 1   esomeprazole (NEXIUM) 20 MG capsule Take 40 mg by mouth daily at 12 noon. OTC     ezetimibe (ZETIA) 10 MG tablet Take 1 tablet (10 mg total) by mouth daily. 90 tablet 3   fluticasone  (FLONASE ) 50 MCG/ACT nasal spray PLACE 2 SPRAYS INTO THE NOSE DAILY. 16 g 2   Glucose Blood (BLOOD GLUCOSE TEST STRIPS) STRP 1 each by In Vitro route in the morning, at noon, and at bedtime. May substitute to any manufacturer covered by patient's insurance. 100 strip 0   Lancet Device MISC 1 each by Does not apply route in the morning, at noon, and at bedtime. May substitute to any manufacturer covered by patient's insurance. 1 each 0   Lancets MISC 1 each by Does not apply route as directed. Dispense based on patient  and insurance preference. Use up to four times daily as directed. (FOR ICD-10 E10.9, E11.9). 100 each 0   lisinopril -hydrochlorothiazide  (ZESTORETIC ) 20-25 MG tablet TAKE ONE TABLET DAILY 90 tablet 3   loratadine  (CLARITIN ) 10 MG tablet TAKE ONE TABLET BY MOUTH ONCE DAILY 30 tablet 11   metFORMIN  (GLUCOPHAGE -XR) 500 MG 24 hr tablet Take 1 tablet (500 mg total) by mouth 2 (two) times daily with a meal. 60 tablet 5   Multiple Vitamin (MULTIVITAMIN) tablet Take 1 tablet by mouth daily.     tretinoin (RETIN-A) 0.1 % cream SMARTSIG:Topical Every Evening (Patient taking differently: PRN)     anastrozole (ARIMIDEX) 1 MG tablet Take 1 tablet by mouth. Every other week (Patient not  taking: Reported on 01/11/2024)  0   No current facility-administered medications for this visit.    Allergies-reviewed and updated No Known Allergies  Social History   Social History Narrative   Lives alone with mom living at him at times.       Work as a Firefighter: working on house, garden, shop   Objective  Objective:  BP 120/78   Pulse 94   Temp (!) 97.2 F (36.2 C) (Tympanic)   Ht 6' 2 (1.88 m)   Wt 235 lb 6.4 oz (106.8 kg)   SpO2 95%   BMI 30.22 kg/m  Gen: NAD, resting comfortably HEENT: Mucous membranes are moist. Oropharynx normal Neck: no thyromegaly CV: RRR no murmurs rubs or gallops Lungs: CTAB no crackles, wheeze, rhonchi Abdomen: soft/nontender/nondistended/normal bowel sounds. No rebound or guarding.  Ext: no edema Skin: warm, dry Neuro: grossly normal, moves all extremities, PERRLA  Diabetic foot exam was performed with the following findings:   No deformities, ulcerations, or other skin breakdown Normal sensation of 10g monofilament Intact posterior tibialis and dorsalis pedis pulses     Results for orders placed or performed in visit on 01/11/24 (from the past 24 hours)  POCT Influenza A/B     Status: None   Collection Time: 01/11/24  2:04 PM  Result Value Ref Range   Influenza A, POC Negative Negative   Influenza B, POC Negative Negative  POC COVID-19     Status: None   Collection Time: 01/11/24  2:05 PM  Result Value Ref Range   SARS Coronavirus 2 Ag Negative Negative  POCT rapid strep A     Status: None   Collection Time: 01/11/24  2:05 PM  Result Value Ref Range   Rapid Strep A Screen Negative Negative       Assessment and Plan  63 y.o. male presenting for annual physical.  Health Maintenance counseling: 1. Anticipatory guidance: Patient counseled regarding regular dental exams -q6 months, eye exams -yearly  avoiding smoking and second hand smoke , limiting alcohol to 2 beverages per day - occasional social, no  illicit drugs .   2. Risk factor reduction:  Advised patient of need for regular exercise and diet rich and fruits and vegetables to reduce risk of heart attack and stroke.  Exercise- breast cancer walk on Saturday but feeling it today- feels needs to get going again in general.  Diet/weight management-down 11 lbs but urinating frequently- could be from diabetes  - refer to diabetes education- always thirsty.  Wt Readings from Last 3 Encounters:  01/11/24 235 lb 6.4 oz (106.8 kg)  08/31/23 246 lb 3.2 oz (111.7 kg)  03/23/23 243 lb 9.6 oz (110.5 kg)  3. Immunizations/screenings/ancillary studies- declines COVID shot. Flu shot  holding off while ill.  Immunization History  Administered Date(s) Administered   Influenza Split 01/15/2012   Influenza Whole 04/19/2007, 12/25/2008, 01/21/2010   Influenza, Seasonal, Injecte, Preservative Fre 03/23/2023   Influenza,inj,Quad PF,6+ Mos 12/06/2012, 04/17/2014, 12/27/2014, 01/07/2016, 12/15/2016, 01/04/2018, 12/06/2018, 12/05/2019, 12/03/2020, 01/06/2022   Influenza-Unspecified 01/15/2012   PFIZER(Purple Top)SARS-COV-2 Vaccination 05/27/2019, 06/17/2019, 12/21/2019   PNEUMOCOCCAL CONJUGATE-20 07/08/2021   Pfizer Covid-19 Vaccine Bivalent Booster 55yrs & up 08/09/2021, 09/13/2021   Rubella 04/13/1969   Td 09/14/2005   Tdap 09/24/2016   Zoster Recombinant(Shingrix ) 09/24/2016, 11/28/2016   4. Prostate cancer screening- low risk prior trend- update psa today   Lab Results  Component Value Date   PSA 0.67 11/04/2022   PSA 0.60 07/08/2021   PSA 0.53 12/03/2020   5. Colon cancer screening - 08/26/21 with 5 year repeat 6. Skin cancer screening- saw dermatology last Monday- good report on biopsy. advised regular sunscreen use. Denies worrisome, changing, or new skin lesions.  7. Smoking associated screening (lung cancer screening, AAA screen 65-75, UA)- never smoker 8. STD screening - opts out- not dating at the moment- never had unprotected sex then not  been tested for sexual transmitted infection  - would be interested in PREP in future if becomes active- encouraged protection  Status of chronic or acute concerns   # Social update-loss mother Rodell March 11 2023- still mourning   #recent illness-throat discomfort, ear pain, cough starting Friday- team will test for COVID and flu and strep.  Likely viral- he will monitor and gave return precautions   # Diabetes- cancelled endocrinology visit today as felt poorly- rescheduled S: Medication:no prescription right now- deductible is high which has placed him in a pinch on options - off all meds too costly Jardiance  10 mg, Ozempic  0.5 mg weekly -GI distress on metformin  but willing to retry CBGs- not checking- needs meter       A/P: hopefully improving- restart metformin  but XR 500 mg twice daily and refer to diabetes educatoin and keep endocrinology follow up   #hypertension S: medication: Lisinopril  hydrochlorothiazide  20-25 mg. No OTC (available over the counter without a prescription) cold medicines BP Readings from Last 3 Encounters:  01/11/24 120/78  01/04/24 130/86  08/31/23 120/70  A/P: well controlled continue current medications   #hyperlipidemia with statin myalgia-severe joint pain even on once a week Crestor or atorvastatin .  Failed Vytorin in the past as well S: Medication:none  Lab Results  Component Value Date   CHOL 292 (H) 11/04/2022   HDL 52.30 11/04/2022   LDLCALC 167 (H) 01/06/2022   LDLDIRECT 231.0 11/04/2022   TRIG (H) 11/04/2022    412.0 Triglyceride is over 400; calculations on Lipids are invalid.   CHOLHDL 6 11/04/2022  A/P: has failed statins in the past with myalgias- other options like Zetia discussed  # Hypogonadism-on Arimidex through [facility]- due for follow up   # Depression S:Medication: Cymbalta  60 mg--> 90mg  - Referral to therapy August 2024    01/11/2024    1:39 PM 08/31/2023    3:24 PM 03/23/2023   11:01 AM  Depression screen PHQ 2/9   Decreased Interest 0 2 2  Down, Depressed, Hopeless 1 3 2   PHQ - 2 Score 1 5 4   Altered sleeping 3 2 3   Tired, decreased energy 3 2 2   Change in appetite 1 2 0  Feeling bad or failure about yourself  0 3 1  Trouble concentrating 1 3 2   Moving slowly or fidgety/restless 0 0 0  Suicidal thoughts 0 0 0  PHQ-9 Score 9 17 12   Difficult doing work/chores Not difficult at all Somewhat difficult Not difficult at all  A/P: significant improvement but still grieving- continue current medications . No toughts of self harm   # GERD S:Medication: Nexium 20 mg daily . Poor control if misses dose Lab Results  Component Value Date   VITAMINB12 656 12/03/2020  A/P: well controlled but not well enough to stop medication(s) - continue to monitor . Check B12 with long term use   # Allergies-Allegra/Flonase  helpful- in general helpful other than recent illness   # Umbilical hernia-declines surgery consult-no pain    Recommended follow up: Return in about 4 months (around 05/13/2024) for followup or sooner if needed.Schedule b4 you leave. Future Appointments  Date Time Provider Department Center  03/07/2024 10:40 AM Dartha Ernst, MD [facility] None   Lab/Order associations:NOT fasting   ICD-10-CM   1. Preventative health care  Z00.00     2. Type 2 diabetes mellitus without complication, without long-term current use of insulin  (HCC)  E11.9 Amb Referral to Nutrition and Diabetic Education    CBC with Differential/Platelet    Comprehensive metabolic panel with GFR    Hemoglobin A1c    Lipid panel    Microalbumin / creatinine urine ratio    CANCELED: Comprehensive metabolic panel with GFR    CANCELED: Lipid panel    CANCELED: CBC with Differential/Platelet    CANCELED: Hemoglobin A1c    CANCELED: Microalbumin / creatinine urine ratio    3. Essential hypertension  I10     4. Hyperlipidemia associated with type 2 diabetes mellitus (HCC)  E11.69    E78.5     5. Screening for prostate  cancer  Z12.5 PSA    CANCELED: PSA    6. Drug-induced myopathy  G72.0     7. High risk medication use  Z79.899 Vitamin B12    CANCELED: Vitamin B12    8. Subacute cough  R05.2 POCT Influenza A/B    POC COVID-19    POCT rapid strep A      Meds ordered this encounter  Medications   metFORMIN  (GLUCOPHAGE -XR) 500 MG 24 hr tablet    Sig: Take 1 tablet (500 mg total) by mouth 2 (two) times daily with a meal.    Dispense:  60 tablet    Refill:  5   Blood Glucose Monitoring Suppl DEVI    Sig: 1 each by Does not apply route in the morning, at noon, and at bedtime. May substitute to any manufacturer covered by patient's insurance.    Dispense:  1 each    Refill:  0   Glucose Blood (BLOOD GLUCOSE TEST STRIPS) STRP    Sig: 1 each by In Vitro route in the morning, at noon, and at bedtime. May substitute to any manufacturer covered by patient's insurance.    Dispense:  100 strip    Refill:  0   Lancet Device MISC    Sig: 1 each by Does not apply route in the morning, at noon, and at bedtime. May substitute to any manufacturer covered by patient's insurance.    Dispense:  1 each    Refill:  0   Lancets MISC    Sig: 1 each by Does not apply route as directed. Dispense based on patient and insurance preference. Use up to four times daily as directed. (FOR ICD-10 E10.9, E11.9).    Dispense:  100 each    Refill:  0  ezetimibe (ZETIA) 10 MG tablet    Sig: Take 1 tablet (10 mg total) by mouth daily.    Dispense:  90 tablet    Refill:  3    Return precautions advised.  Garnette Lukes, MD

## 2024-01-11 NOTE — Patient Instructions (Signed)
 COVID, strep, and flu negative- likely a cold- continue to monitor and see us  back if worsens drastically or if over 10 days of symptoms   Start metformin  500 mg extended release twice daily-for the first week can take it just once a day to make sure you tolerate it with prior stomach effects  hopefully improving- restart metformin  but XR 500 mg twice daily and refer to diabetes educatoin and keep endocrinology follow up   Try Zetia for cholesterol but wait to make sure tolerating metformin  first at full dose  We have placed a referral for you today to diabetes education- please call their # if you do not hear within a week (may be listed below or you may see mychart message within a few days with #).   Schedule a lab visit at the check out desk within 2 weeks. Return for future fasting labs meaning nothing but water after midnight please. Ok to take your medications with water.    Recommended follow up: Return in about 4 months (around 05/13/2024) for followup or sooner if needed.Schedule b4 you leave.

## 2024-03-07 ENCOUNTER — Ambulatory Visit: Admitting: "Endocrinology

## 2024-03-28 ENCOUNTER — Encounter: Payer: Self-pay | Admitting: Skilled Nursing Facility1

## 2024-03-31 ENCOUNTER — Other Ambulatory Visit (HOSPITAL_COMMUNITY): Payer: Self-pay

## 2024-04-05 ENCOUNTER — Ambulatory Visit: Admitting: "Endocrinology

## 2024-04-12 ENCOUNTER — Other Ambulatory Visit (HOSPITAL_COMMUNITY): Payer: Self-pay

## 2024-04-25 ENCOUNTER — Encounter: Payer: Self-pay | Admitting: Skilled Nursing Facility1

## 2024-05-05 ENCOUNTER — Ambulatory Visit: Admitting: "Endocrinology

## 2024-05-23 ENCOUNTER — Ambulatory Visit: Payer: Self-pay | Admitting: Family Medicine
